# Patient Record
Sex: Female | Born: 1975 | Race: Black or African American | Hispanic: No | Marital: Single | Smoking: Never smoker
Health system: Southern US, Community
[De-identification: ages and names within clinical notes are randomized; demographics above are authoritative.]

## PROBLEM LIST (undated history)

## (undated) DIAGNOSIS — N946 Dysmenorrhea, unspecified: Secondary | ICD-10-CM

## (undated) DIAGNOSIS — F439 Reaction to severe stress, unspecified: Secondary | ICD-10-CM

## (undated) DIAGNOSIS — F329 Major depressive disorder, single episode, unspecified: Secondary | ICD-10-CM

## (undated) DIAGNOSIS — D649 Anemia, unspecified: Secondary | ICD-10-CM

## (undated) DIAGNOSIS — D219 Benign neoplasm of connective and other soft tissue, unspecified: Secondary | ICD-10-CM

## (undated) DIAGNOSIS — F32A Depression, unspecified: Secondary | ICD-10-CM

## (undated) DIAGNOSIS — G4700 Insomnia, unspecified: Secondary | ICD-10-CM

## (undated) DIAGNOSIS — D509 Iron deficiency anemia, unspecified: Secondary | ICD-10-CM

## (undated) DIAGNOSIS — Z9289 Personal history of other medical treatment: Secondary | ICD-10-CM

## (undated) DIAGNOSIS — I10 Essential (primary) hypertension: Secondary | ICD-10-CM

## (undated) HISTORY — DX: Dysmenorrhea, unspecified: N94.6

## (undated) HISTORY — PX: OTHER SURGICAL HISTORY: SHX169

## (undated) HISTORY — DX: Benign neoplasm of connective and other soft tissue, unspecified: D21.9

## (undated) HISTORY — DX: Major depressive disorder, single episode, unspecified: F32.9

## (undated) HISTORY — DX: Iron deficiency anemia, unspecified: D50.9

## (undated) HISTORY — PX: DILATION AND CURETTAGE OF UTERUS: SHX78

## (undated) HISTORY — DX: Insomnia, unspecified: G47.00

## (undated) HISTORY — DX: Reaction to severe stress, unspecified: F43.9

## (undated) HISTORY — DX: Essential (primary) hypertension: I10

## (undated) HISTORY — DX: Depression, unspecified: F32.A

---

## 2001-04-25 ENCOUNTER — Other Ambulatory Visit: Admission: RE | Admit: 2001-04-25 | Discharge: 2001-04-25 | Payer: Self-pay | Admitting: *Deleted

## 2002-04-25 ENCOUNTER — Other Ambulatory Visit: Admission: RE | Admit: 2002-04-25 | Discharge: 2002-04-25 | Payer: Self-pay | Admitting: *Deleted

## 2003-05-27 ENCOUNTER — Other Ambulatory Visit: Admission: RE | Admit: 2003-05-27 | Discharge: 2003-05-27 | Payer: Self-pay | Admitting: Gynecology

## 2004-06-02 ENCOUNTER — Other Ambulatory Visit: Admission: RE | Admit: 2004-06-02 | Discharge: 2004-06-02 | Payer: Self-pay | Admitting: Gynecology

## 2011-07-29 ENCOUNTER — Encounter (HOSPITAL_COMMUNITY): Payer: Self-pay | Admitting: *Deleted

## 2011-07-29 ENCOUNTER — Emergency Department (HOSPITAL_COMMUNITY)
Admission: EM | Admit: 2011-07-29 | Discharge: 2011-07-29 | Disposition: A | Discharge status: Home / Self Care | Payer: Self-pay | Attending: Emergency Medicine | Admitting: Emergency Medicine

## 2011-07-29 DIAGNOSIS — N3090 Cystitis, unspecified without hematuria: Secondary | ICD-10-CM | POA: Insufficient documentation

## 2011-07-29 DIAGNOSIS — R319 Hematuria, unspecified: Secondary | ICD-10-CM | POA: Insufficient documentation

## 2011-07-29 DIAGNOSIS — R300 Dysuria: Secondary | ICD-10-CM | POA: Insufficient documentation

## 2011-07-29 LAB — URINALYSIS, ROUTINE W REFLEX MICROSCOPIC
Glucose, UA: NEGATIVE mg/dL
Protein, ur: 100 mg/dL — AB
Specific Gravity, Urine: 1.004 — ABNORMAL LOW (ref 1.005–1.030)
pH: 6.5 (ref 5.0–8.0)

## 2011-07-29 LAB — URINE MICROSCOPIC-ADD ON

## 2011-07-29 LAB — POCT PREGNANCY, URINE: Preg Test, Ur: NEGATIVE

## 2011-07-29 MED ORDER — CIPROFLOXACIN HCL 500 MG PO TABS: 500.0000 mg | ORAL_TABLET | Freq: Two times a day (BID) | ORAL | Status: AC

## 2011-07-29 MED ORDER — PHENAZOPYRIDINE HCL 200 MG PO TABS: 200.0000 mg | ORAL_TABLET | Freq: Three times a day (TID) | ORAL | Status: AC

## 2011-07-29 NOTE — ED Notes (Signed)
Patient presents with c/o frequent urination and burning upon urination.

## 2011-07-29 NOTE — ED Notes (Addendum)
C/o dysuria, frequency, urgency and retention. Onset 0200. (Denies: fever, back pain or nv). Urine is pink.

## 2011-07-29 NOTE — ED Provider Notes (Signed)
History     CSN: [id number]  Arrival date & time 07/29/11  4098   First MD Initiated Contact with Patient 07/29/11 [phone]      Chief Complaint  Patient presents with  . Dysuria    (Consider location/radiation/quality/duration/timing/severity/associated sxs/prior treatment) Patient is a 36 y.o. female presenting with dysuria. The history is provided by the patient. No language interpreter was used.  Dysuria  This is a recurrent problem. The current episode started 6 to 12 hours ago. The problem occurs every urination. The problem has not changed since onset.The quality of the pain is described as burning. Pain scale: no pain when not urinating. There has been no fever. There is no history of pyelonephritis. Associated symptoms include frequency, hematuria, hesitancy and urgency. Pertinent negatives include no chills, no sweats, no nausea, no vomiting, no discharge, no possible pregnancy and no flank pain. She has tried nothing for the symptoms. Her past medical history is significant for recurrent UTIs. Her past medical history does not include kidney stones.    History reviewed. No pertinent past medical history.  History reviewed. No pertinent past surgical history.  Family History  Problem Relation Age of Onset  . Diabetes Mother   . Hypertension Mother     History  Substance Use Topics  . Smoking status: Never Smoker   . Smokeless tobacco: Not on file  . Alcohol Use: No    OB History    Grav Para Term Preterm Abortions TAB SAB Ect Mult Living                  Review of Systems  Constitutional: Negative for chills.  Gastrointestinal: Negative for nausea and vomiting.  Genitourinary: Positive for dysuria, hesitancy, urgency, frequency and hematuria. Negative for flank pain.  All other systems reviewed and are negative.    Allergies  Review of patient's allergies indicates no known allergies.  Home Medications   Current Outpatient Rx  Name Route Sig Dispense  Refill  . PHENTERMINE HCL 37.5 MG PO CAPS Oral Take 37.5 mg by mouth every morning.      BP 132/79  Pulse 94  Temp(Src) 97.9 F (36.6 C) (Oral)  Resp 16  SpO2 100%  LMP 07/15/2011  Physical Exam  Constitutional: She is oriented to person, place, and time. She appears well-developed and well-nourished.  HENT:  Head: Normocephalic and atraumatic.  Eyes: Conjunctivae are normal. Pupils are equal, round, and reactive to light.  Neck: Normal range of motion. Neck supple.  Cardiovascular: Normal rate and regular rhythm.   Pulmonary/Chest: Effort normal and breath sounds normal.  Abdominal: Soft. Bowel sounds are normal. There is no tenderness. There is no rebound and no guarding.  Musculoskeletal: Normal range of motion.  Neurological: She is alert and oriented to person, place, and time.  Skin: Skin is warm and dry.  Psychiatric: She has a normal mood and affect.    ED Course  Procedures (including critical care time)   Labs Reviewed  POCT PREGNANCY, URINE  URINALYSIS, ROUTINE W REFLEX MICROSCOPIC   No results found.   No diagnosis found.    MDM  Return for fevers, chills, n/v/d.  Follow up with your family doctor       Robertine Kipper K Kalsey Lull-Rasch, MD 07/30/11 [phone]

## 2011-07-29 NOTE — Discharge Instructions (Signed)

## 2014-06-25 ENCOUNTER — Encounter (HOSPITAL_COMMUNITY): Payer: Self-pay | Admitting: *Deleted

## 2014-06-25 ENCOUNTER — Emergency Department (HOSPITAL_COMMUNITY)
Admission: EM | Admit: 2014-06-25 | Discharge: 2014-06-25 | Disposition: A | Discharge status: Home / Self Care | Payer: 59 | Attending: Emergency Medicine | Admitting: Emergency Medicine

## 2014-06-25 DIAGNOSIS — Z3202 Encounter for pregnancy test, result negative: Secondary | ICD-10-CM | POA: Diagnosis not present

## 2014-06-25 DIAGNOSIS — D649 Anemia, unspecified: Secondary | ICD-10-CM | POA: Insufficient documentation

## 2014-06-25 DIAGNOSIS — R61 Generalized hyperhidrosis: Secondary | ICD-10-CM | POA: Diagnosis not present

## 2014-06-25 DIAGNOSIS — R110 Nausea: Secondary | ICD-10-CM

## 2014-06-25 DIAGNOSIS — N390 Urinary tract infection, site not specified: Secondary | ICD-10-CM | POA: Diagnosis not present

## 2014-06-25 DIAGNOSIS — R112 Nausea with vomiting, unspecified: Secondary | ICD-10-CM | POA: Diagnosis present

## 2014-06-25 LAB — COMPREHENSIVE METABOLIC PANEL
ALK PHOS: 96 U/L (ref 39–117)
ALT: 19 U/L (ref 0–35)
AST: 26 U/L (ref 0–37)
Albumin: 3.7 g/dL (ref 3.5–5.2)
Anion gap: 12 (ref 5–15)
BILIRUBIN TOTAL: 0.3 mg/dL (ref 0.3–1.2)
BUN: 12 mg/dL (ref 6–23)
CHLORIDE: 107 mmol/L (ref 96–112)
CO2: 21 mmol/L (ref 19–32)
Calcium: 9.2 mg/dL (ref 8.4–10.5)
Creatinine, Ser: 0.7 mg/dL (ref 0.50–1.10)
GLUCOSE: 118 mg/dL — AB (ref 70–99)
POTASSIUM: 3.9 mmol/L (ref 3.5–5.1)
SODIUM: 140 mmol/L (ref 135–145)
Total Protein: 7.6 g/dL (ref 6.0–8.3)

## 2014-06-25 LAB — CBC WITH DIFFERENTIAL/PLATELET
BASOS PCT: 0 % (ref 0–1)
Basophils Absolute: 0 10*3/uL (ref 0.0–0.1)
EOS ABS: 0 10*3/uL (ref 0.0–0.7)
EOS PCT: 0 % (ref 0–5)
HCT: 29.5 % — ABNORMAL LOW (ref 36.0–46.0)
HEMOGLOBIN: 8.6 g/dL — AB (ref 12.0–15.0)
LYMPHS PCT: 5 % — AB (ref 12–46)
Lymphs Abs: 0.5 10*3/uL — ABNORMAL LOW (ref 0.7–4.0)
MCH: 20.1 pg — ABNORMAL LOW (ref 26.0–34.0)
MCHC: 29.2 g/dL — AB (ref 30.0–36.0)
MCV: 68.9 fL — ABNORMAL LOW (ref 78.0–100.0)
Monocytes Absolute: 0.3 10*3/uL (ref 0.1–1.0)
Monocytes Relative: 3 % (ref 3–12)
NEUTROS PCT: 92 % — AB (ref 43–77)
Neutro Abs: 8.3 10*3/uL — ABNORMAL HIGH (ref 1.7–7.7)
Platelets: 390 10*3/uL (ref 150–400)
RBC: 4.28 MIL/uL (ref 3.87–5.11)
RDW: 17.7 % — ABNORMAL HIGH (ref 11.5–15.5)
WBC: 9.1 10*3/uL (ref 4.0–10.5)

## 2014-06-25 LAB — PREGNANCY, URINE: PREG TEST UR: NEGATIVE

## 2014-06-25 LAB — URINE MICROSCOPIC-ADD ON

## 2014-06-25 LAB — URINALYSIS, ROUTINE W REFLEX MICROSCOPIC
BILIRUBIN URINE: NEGATIVE
Glucose, UA: NEGATIVE mg/dL
HGB URINE DIPSTICK: NEGATIVE
KETONES UR: NEGATIVE mg/dL
NITRITE: NEGATIVE
PROTEIN: NEGATIVE mg/dL
SPECIFIC GRAVITY, URINE: 1.025 (ref 1.005–1.030)
UROBILINOGEN UA: 0.2 mg/dL (ref 0.0–1.0)
pH: 6 (ref 5.0–8.0)

## 2014-06-25 LAB — LIPASE, BLOOD: Lipase: 21 U/L (ref 11–59)

## 2014-06-25 MED ORDER — ONDANSETRON 8 MG PO TBDP: 8.0000 mg | ORAL_TABLET | Freq: Three times a day (TID) | ORAL | Status: DC | PRN

## 2014-06-25 MED ORDER — ONDANSETRON 4 MG PO TBDP
ORAL_TABLET | ORAL | Status: AC
  Filled 2014-06-25: qty 2

## 2014-06-25 MED ORDER — ONDANSETRON 4 MG PO TBDP
8.0000 mg | ORAL_TABLET | Freq: Once | ORAL | Status: AC
  Administered 2014-06-25: 8 mg via ORAL

## 2014-06-25 MED ORDER — SODIUM CHLORIDE 0.9 % IV BOLUS (SEPSIS)
1000.0000 mL | Freq: Once | INTRAVENOUS | Status: AC
  Administered 2014-06-25: 1000 mL via INTRAVENOUS

## 2014-06-25 MED ORDER — CEPHALEXIN 500 MG PO CAPS: 500.0000 mg | ORAL_CAPSULE | Freq: Three times a day (TID) | ORAL | Status: DC

## 2014-06-25 NOTE — ED Notes (Signed)
Pt reports vomiting before work, on the way to work, and while waiting in the waiting room. Pt denies diarrhea or fevers at home.

## 2014-06-25 NOTE — ED Notes (Signed)
Pt comfortable with discharge and follow up instructions. Prescriptions x2. 

## 2014-06-25 NOTE — Discharge Instructions (Signed)
Anemia, Nonspecific Anemia is a condition in which the concentration of red blood cells or hemoglobin in the blood is below normal. Hemoglobin is a substance in red blood cells that carries oxygen to the tissues of the body. Anemia results in not enough oxygen reaching these tissues.  CAUSES  Common causes of anemia include:   Excessive bleeding. Bleeding may be internal or external. This includes excessive bleeding from periods (in women) or from the intestine.   Poor nutrition.   Chronic kidney, thyroid, and liver disease.  Bone marrow disorders that decrease red blood cell production.  Cancer and treatments for cancer.  HIV, AIDS, and their treatments.  Spleen problems that increase red blood cell destruction.  Blood disorders.  Excess destruction of red blood cells due to infection, medicines, and autoimmune disorders. SIGNS AND SYMPTOMS   Minor weakness.   Dizziness.   Headache.  Palpitations.   Shortness of breath, especially with exercise.   Paleness.  Cold sensitivity.  Indigestion.  Nausea.  Difficulty sleeping.  Difficulty concentrating. Symptoms may occur suddenly or they may develop slowly.  DIAGNOSIS  Additional blood tests are often needed. These help your health care provider determine the best treatment. Your health care provider will check your stool for blood and look for other causes of blood loss.  TREATMENT  Treatment varies depending on the cause of the anemia. Treatment can include:   Supplements of iron, vitamin J24, or folic acid.   Hormone medicines.   A blood transfusion. This may be needed if blood loss is severe.   Hospitalization. This may be needed if there is significant continual blood loss.   Dietary changes.  Spleen removal. HOME CARE INSTRUCTIONS Keep all follow-up appointments. It often takes many weeks to correct anemia, and having your health care provider check on your condition and your response to  treatment is very important. SEEK IMMEDIATE MEDICAL CARE IF:   You develop extreme weakness, shortness of breath, or chest pain.   You become dizzy or have trouble concentrating.  You develop heavy vaginal bleeding.   You develop a rash.   You have bloody or black, tarry stools.   You faint.   You vomit up blood.   You vomit repeatedly.   You have abdominal pain.  You have a fever or persistent symptoms for more than 2-3 days.   You have a fever and your symptoms suddenly get worse.   You are dehydrated.  MAKE SURE YOU:  Understand these instructions.  Will watch your condition.  Will get help right away if you are not doing well or get worse. Document Released: 04/20/2004 Document Revised: 11/13/2012 Document Reviewed: 09/06/2012 [facility] Patient Information 2015 [place], [place]. This information is not intended to replace advice given to you by your health care provider. Make sure you discuss any questions you have with your health care provider.  Nausea, Adult Nausea is the feeling that you have an upset stomach or have to vomit. Nausea by itself is not likely a serious concern, but it may be an early sign of more serious medical problems. As nausea gets worse, it can lead to vomiting. If vomiting develops, there is the risk of dehydration.  CAUSES   Viral infections.  Food poisoning.  Medicines.  Pregnancy.  Motion sickness.  Migraine headaches.  Emotional distress.  Severe pain from any source.  Alcohol intoxication. HOME CARE INSTRUCTIONS  Get plenty of rest.  Ask your caregiver about specific rehydration instructions.  Eat small amounts of food  and sip liquids more often.  Take all medicines as told by your caregiver. SEEK MEDICAL CARE IF:  You have not improved after 2 days, or you get worse.  You have a headache. SEEK IMMEDIATE MEDICAL CARE IF:   You have a fever.  You faint.  You keep vomiting or have blood in your  vomit.  You are extremely weak or dehydrated.  You have dark or bloody stools.  You have severe chest or abdominal pain. MAKE SURE YOU:  Understand these instructions.  Will watch your condition.  Will get help right away if you are not doing well or get worse. Document Released: 04/20/2004 Document Revised: 12/06/2011 Document Reviewed: 11/23/2010 [facility] Patient Information 2015 [place], [place]. This information is not intended to replace advice given to you by your health care provider. Make sure you discuss any questions you have with your health care provider.  Urinary Tract Infection A urinary tract infection (UTI) can occur any place along the urinary tract. The tract includes the kidneys, ureters, bladder, and urethra. A type of germ called bacteria often causes a UTI. UTIs are often helped with antibiotic medicine.  HOME CARE   If given, take antibiotics as told by your doctor. Finish them even if you start to feel better.  Drink enough fluids to keep your pee (urine) clear or pale yellow.  Avoid tea, drinks with caffeine, and bubbly (carbonated) drinks.  Pee often. Avoid holding your pee in for a long time.  Pee before and after having sex (intercourse).  Wipe from front to back after you poop (bowel movement) if you are a woman. Use each tissue only once. GET HELP RIGHT AWAY IF:   You have back pain.  You have lower belly (abdominal) pain.  You have chills.  You feel sick to your stomach (nauseous).  You throw up (vomit).  Your burning or discomfort with peeing does not go away.  You have a fever.  Your symptoms are not better in 3 days. MAKE SURE YOU:   Understand these instructions.  Will watch your condition.  Will get help right away if you are not doing well or get worse. Document Released: 08/30/2007 Document Revised: 12/06/2011 Document Reviewed: 10/12/2011 [facility] Patient Information 2015 [place], [place]. This information is not intended  to replace advice given to you by your health care provider. Make sure you discuss any questions you have with your health care provider.

## 2014-06-25 NOTE — ED Provider Notes (Signed)
CSN: [id number]     Arrival date & time 06/25/14  1440 History   First MD Initiated Contact with Patient 06/25/14 1604     Chief Complaint  Patient presents with  . Emesis  . Nausea    Patient is a 39 y.o. female presenting with vomiting. The history is provided by the patient.  Emesis Severity:  Moderate Duration:  1 day Timing:  Intermittent Number of daily episodes:  6 Progression:  Unchanged Chronicity:  New Relieved by:  Nothing Worsened by:  Liquids Associated symptoms: abdominal pain and chills   Associated symptoms: no diarrhea and no fever   Abdominal pain:    Location:  Suprapubic   Quality:  Dull (it occurs when she throws up)   Severity:  Mild Risk factors: no prior abdominal surgery, no sick contacts and no travel to endemic areas     History reviewed. No pertinent past medical history. History reviewed. No pertinent past surgical history. Family History  Problem Relation Age of Onset  . Diabetes Mother   . Hypertension Mother    History  Substance Use Topics  . Smoking status: Never Smoker   . Smokeless tobacco: Not on file  . Alcohol Use: No   OB History    No data available     Review of Systems  Constitutional: Positive for chills and diaphoresis.  Gastrointestinal: Positive for vomiting and abdominal pain. Negative for diarrhea.  Genitourinary: Negative for dysuria, vaginal bleeding and vaginal discharge.  All other systems reviewed and are negative.     Allergies  Review of patient's allergies indicates no known allergies.  Home Medications   Prior to Admission medications   Medication Sig Start Date End Date Taking? Authorizing Provider  acetaminophen (TYLENOL) 650 MG CR tablet Take 650 mg by mouth every 8 (eight) hours as needed for pain or fever.   Yes Historical Provider, MD  cephALEXin (KEFLEX) 500 MG capsule Take 1 capsule (500 mg total) by mouth 3 (three) times daily. 06/25/14   Dorie Rank, MD  ondansetron (ZOFRAN ODT) 8 MG  disintegrating tablet Take 1 tablet (8 mg total) by mouth every 8 (eight) hours as needed for nausea or vomiting. 06/25/14   Dorie Rank, MD   BP 120/55 mmHg  Pulse 93  Temp(Src) 98.2 F (36.8 C) (Oral)  Resp 18  SpO2 100%  LMP 06/07/2014 (Exact Date) Physical Exam  Constitutional: She appears well-developed and well-nourished. No distress.  HENT:  Head: Normocephalic and atraumatic.  Right Ear: External ear normal.  Left Ear: External ear normal.  Eyes: Conjunctivae are normal. Right eye exhibits no discharge. Left eye exhibits no discharge. No scleral icterus.  Neck: Neck supple. No tracheal deviation present.  Cardiovascular: Normal rate, regular rhythm and intact distal pulses.   Pulmonary/Chest: Effort normal and breath sounds normal. No stridor. No respiratory distress. She has no wheezes. She has no rales.  Abdominal: Soft. Bowel sounds are normal. She exhibits no distension. There is no tenderness. There is no rebound and no guarding.  Musculoskeletal: She exhibits no edema or tenderness.  Neurological: She is alert. She has normal strength. No cranial nerve deficit (no facial droop, extraocular movements intact, no slurred speech) or sensory deficit. She exhibits normal muscle tone. She displays no seizure activity. Coordination normal.  Skin: Skin is warm and dry. No rash noted.  Psychiatric: She has a normal mood and affect.  Nursing note and vitals reviewed.   ED Course  Procedures (including critical care time) Labs Review Labs  Reviewed  CBC WITH DIFFERENTIAL/PLATELET - Abnormal; Notable for the following:    Hemoglobin 8.6 (*)    HCT 29.5 (*)    MCV 68.9 (*)    MCH 20.1 (*)    MCHC 29.2 (*)    RDW 17.7 (*)    Neutrophils Relative % 92 (*)    Lymphocytes Relative 5 (*)    Neutro Abs 8.3 (*)    Lymphs Abs 0.5 (*)    All other components within normal limits  COMPREHENSIVE METABOLIC PANEL - Abnormal; Notable for the following:    Glucose, Bld 118 (*)    All other  components within normal limits  URINALYSIS, ROUTINE W REFLEX MICROSCOPIC - Abnormal; Notable for the following:    APPearance CLOUDY (*)    Leukocytes, UA TRACE (*)    All other components within normal limits  URINE MICROSCOPIC-ADD ON - Abnormal; Notable for the following:    Bacteria, UA MANY (*)    All other components within normal limits  URINE CULTURE  LIPASE, BLOOD  PREGNANCY, URINE     MDM   Final diagnoses:  Nausea  Anemia, unspecified anemia type  UTI (lower urinary tract infection)    The patient's urinalysis is suggestive of a urinary tract infection. She improved with fluids and oral Zofran given at triage.  I doubt appendicitis, colitis.  Patient was discharged home with a course of Keflex. I did discuss her anemia with her. She states she has a history of that but has not been taking medications for. I suggested she take over-the-counter iron and follow-up with a primary care doctor.   Dorie Rank, MD 06/25/14 [phone]

## 2014-06-27 LAB — URINE CULTURE

## 2016-09-21 ENCOUNTER — Other Ambulatory Visit (HOSPITAL_COMMUNITY)
Admission: RE | Admit: 2016-09-21 | Discharge: 2016-09-21 | Disposition: A | Discharge status: Home / Self Care | Payer: 59 | Source: Ambulatory Visit | Attending: Obstetrics and Gynecology | Admitting: Obstetrics and Gynecology

## 2016-09-21 ENCOUNTER — Ambulatory Visit (INDEPENDENT_AMBULATORY_CARE_PROVIDER_SITE_OTHER): Payer: No Typology Code available for payment source | Admitting: Obstetrics and Gynecology

## 2016-09-21 ENCOUNTER — Encounter: Payer: Self-pay | Admitting: Obstetrics and Gynecology

## 2016-09-21 VITALS — BP 118/70 | HR 68 | Resp 16 | Ht 67.0 in | Wt 218.0 lb

## 2016-09-21 DIAGNOSIS — N946 Dysmenorrhea, unspecified: Secondary | ICD-10-CM | POA: Diagnosis not present

## 2016-09-21 DIAGNOSIS — Z124 Encounter for screening for malignant neoplasm of cervix: Secondary | ICD-10-CM | POA: Diagnosis not present

## 2016-09-21 DIAGNOSIS — N852 Hypertrophy of uterus: Secondary | ICD-10-CM | POA: Diagnosis not present

## 2016-09-21 DIAGNOSIS — Z0000 Encounter for general adult medical examination without abnormal findings: Secondary | ICD-10-CM | POA: Diagnosis not present

## 2016-09-21 DIAGNOSIS — Z01419 Encounter for gynecological examination (general) (routine) without abnormal findings: Secondary | ICD-10-CM | POA: Diagnosis not present

## 2016-09-21 DIAGNOSIS — N941 Unspecified dyspareunia: Secondary | ICD-10-CM

## 2016-09-21 DIAGNOSIS — B379 Candidiasis, unspecified: Secondary | ICD-10-CM | POA: Insufficient documentation

## 2016-09-21 DIAGNOSIS — Z833 Family history of diabetes mellitus: Secondary | ICD-10-CM | POA: Diagnosis not present

## 2016-09-21 MED ORDER — NAPROXEN SODIUM 550 MG PO TABS: 550.0000 mg | ORAL_TABLET | Freq: Two times a day (BID) | ORAL | 2 refills | Status: DC

## 2016-09-21 NOTE — Progress Notes (Signed)
41 y.o. G0P0000 Single African AmericanF here as a new patient for annual exam.   Period Cycle (Days): 28 Period Duration (Days): 5-6 Period Pattern: Regular Menstrual Flow: Moderate Menstrual Control: Tampon, Thin pad Menstrual Control Change Freq (Hours): 2-3 Dysmenorrhea: (!) Severe Dysmenorrhea Symptoms: Cramping, Throbbing, Headache  She c/o severe deep dyspareunia for the last year, sometimes positional, mostly painful. She partner x 9 years, live together. Uses w/d for contraception.  Her menstrual cramps have gotten worse in the last 1-2 years. Helped with ibuprofen some. Able to function with the ibuprofen.  She c/o frequent "yeast infections", starts with intense itching, over the counter medications help.   Patient's last menstrual period was 09/05/2016.          Sexually active: Yes.    The current method of family planning is none.    Exercising: No.  The patient does not participate in regular exercise at present. Smoker:  no  Health Maintenance: Pap:  10/2011 [facility] History of abnormal Pap:  no MMG:  n/a Colonoscopy:  n/a BMD:   n/a TDaP:  09/2015 Gardasil: n/a   reports that she has never smoked. She has never used smokeless tobacco. She reports that she does not drink alcohol or use drugs. She is a Freight forwarder at [employer]  History reviewed. No pertinent past medical history.  History reviewed. No pertinent surgical history.  Current Outpatient Prescriptions  Medication Sig Dispense Refill  . acetaminophen (TYLENOL) 650 MG CR tablet Take 650 mg by mouth every 8 (eight) hours as needed for pain or fever.     No current facility-administered medications for this visit.     Family History  Problem Relation Age of Onset  . Diabetes Mother   . Hypertension Mother     Review of Systems  Constitutional: Negative.   HENT: Negative.   Eyes: Negative.   Respiratory: Negative.   Cardiovascular: Negative.   Gastrointestinal:  Negative.   Endocrine: Negative.   Genitourinary: Negative.   Musculoskeletal: Negative.   Skin: Negative.   Allergic/Immunologic: Negative.   Neurological: Negative.   Hematological: Negative.   Psychiatric/Behavioral: Negative.     Exam:   BP 118/70 (BP Location: Right Arm, Patient Position: Sitting, Cuff Size: Normal)   Pulse 68   Resp 16   Ht 5\' 7"  (1.702 m)   Wt 218 lb (98.9 kg)   LMP 09/05/2016   BMI 34.14 kg/m   Weight change: @WEIGHTCHANGE @ Height:   Height: 5\' 7"  (170.2 cm)  Ht Readings from Last 3 Encounters:  09/21/16 5\' 7"  (1.702 m)    General appearance: alert, cooperative and appears stated age Head: Normocephalic, without obvious abnormality, atraumatic Neck: no adenopathy, supple, symmetrical, trachea midline and thyroid normal to inspection and palpation Lungs: clear to auscultation bilaterally Cardiovascular: regular rate and rhythm Breasts: normal appearance, no masses or tenderness Abdomen: soft, non-tender; bowel sounds normal; no masses,  no organomegaly Extremities: extremities normal, atraumatic, no cyanosis or edema Skin: Skin color, texture, turgor normal. No rashes or lesions Lymph nodes: Cervical, supraclavicular, and axillary nodes normal. No abnormal inguinal nodes palpated Neurologic: Grossly normal   Pelvic: External genitalia:  no lesions              Urethra:  normal appearing urethra with no masses, tenderness or lesions              Bartholins and Skenes: normal  Vagina: normal appearing vagina with normal color and discharge, no lesions              Cervix: no cervical motion tenderness and no lesions               Bimanual Exam:  Uterus:  anteverted, mobile, slightly enlarged, mildly tender              Adnexa: no mass, fullness, tenderness               Rectovaginal: Confirms               Anus:  normal sphincter tone, no lesions Pelvic floor: not tender  Chaperone was present for exam.  A:  Well Woman with  normal exam  Severe, worsening cramps  Severe dyspareunia  Enlarged uterus, mildly tender  Family history of diabetes   P:   Pap with hpv, GC/CT  Screening labs  HgbA1C  Return for an ultrasound  Discussed the possibility of OCP's or a mirena IUD  Discussed breast self awareness  Discussed calcium and vit D intake  Anaprox for cramps  Mammogram after 41, # given to schedule

## 2016-09-21 NOTE — Patient Instructions (Signed)

## 2016-09-22 ENCOUNTER — Telehealth: Payer: Self-pay | Admitting: Obstetrics & Gynecology

## 2016-09-22 LAB — HEMOGLOBIN A1C
Est. average glucose Bld gHb Est-mCnc: 105 mg/dL
HEMOGLOBIN A1C: 5.3 % (ref 4.8–5.6)

## 2016-09-22 LAB — COMPREHENSIVE METABOLIC PANEL
A/G RATIO: 1.4 (ref 1.2–2.2)
ALBUMIN: 4.2 g/dL (ref 3.5–5.5)
ALT: 8 IU/L (ref 0–32)
AST: 15 IU/L (ref 0–40)
Alkaline Phosphatase: 103 IU/L (ref 39–117)
BUN / CREAT RATIO: 12 (ref 9–23)
BUN: 8 mg/dL (ref 6–20)
Bilirubin Total: 0.2 mg/dL (ref 0.0–1.2)
CALCIUM: 8.8 mg/dL (ref 8.7–10.2)
CO2: 19 mmol/L — AB (ref 20–29)
CREATININE: 0.65 mg/dL (ref 0.57–1.00)
Chloride: 105 mmol/L (ref 96–106)
GFR calc non Af Amer: 112 mL/min/{1.73_m2} (ref >59)
GFR, EST AFRICAN AMERICAN: 129 mL/min/{1.73_m2} (ref >59)
GLOBULIN, TOTAL: 2.9 g/dL (ref 1.5–4.5)
Glucose: 78 mg/dL (ref 65–99)
POTASSIUM: 4.1 mmol/L (ref 3.5–5.2)
SODIUM: 139 mmol/L (ref 134–144)
TOTAL PROTEIN: 7.1 g/dL (ref 6.0–8.5)

## 2016-09-22 LAB — LIPID PANEL
CHOL/HDL RATIO: 2.2 ratio (ref 0.0–4.4)
Cholesterol, Total: 161 mg/dL (ref 100–199)
HDL: 73 mg/dL (ref >39)
LDL CALC: 75 mg/dL (ref 0–99)
Triglycerides: 64 mg/dL (ref 0–149)
VLDL Cholesterol Cal: 13 mg/dL (ref 5–40)

## 2016-09-22 LAB — CBC
HEMATOCRIT: 21.2 % — AB (ref 34.0–46.6)
HEMOGLOBIN: 5.7 g/dL — AB (ref 11.1–15.9)
MCH: 16.3 pg — ABNORMAL LOW (ref 26.6–33.0)
MCHC: 26.9 g/dL — ABNORMAL LOW (ref 31.5–35.7)
MCV: 61 fL — AB (ref 79–97)
Platelets: 213 10*3/uL (ref 150–379)
RBC: 3.49 x10E6/uL — AB (ref 3.77–5.28)
RDW: 21.3 % — AB (ref 12.3–15.4)
WBC: 4 10*3/uL (ref 3.4–10.8)

## 2016-09-22 NOTE — Telephone Encounter (Signed)
Call received from [facility].  Hb 5.7.  Message left on patient's answering machine to call you back.

## 2016-09-24 DIAGNOSIS — Z9289 Personal history of other medical treatment: Secondary | ICD-10-CM

## 2016-09-24 HISTORY — DX: Personal history of other medical treatment: Z92.89

## 2016-09-25 ENCOUNTER — Telehealth: Payer: Self-pay | Admitting: Obstetrics and Gynecology

## 2016-09-25 ENCOUNTER — Other Ambulatory Visit: Payer: Self-pay | Admitting: Obstetrics and Gynecology

## 2016-09-25 DIAGNOSIS — D508 Other iron deficiency anemias: Secondary | ICD-10-CM

## 2016-09-25 DIAGNOSIS — D649 Anemia, unspecified: Secondary | ICD-10-CM

## 2016-09-25 NOTE — Telephone Encounter (Signed)
Call to [facility] to schedule patient. Directed to a scheduling voicemail. Left message noting STAT need for scheduling. Will call back later today to follow up on voicemail.

## 2016-09-25 NOTE — Telephone Encounter (Signed)
Left message to call Annetta North at [phone].  Notes recorded by Salvadore Dom, MD on 09/23/2016 at 8:42 AM EDT Called patient again. Per DPR detailed left was message about her anemia, I requested that she call me on my cell phone. If I don't hear from her by tomorrow, I will call her emergency contact.  Please call hematology Monday first thing, she needs an appointment right away for evaluation, possible transfusion. ------  Notes recorded by Salvadore Dom, MD on 09/23/2016 at 8:25 AM EDT The patient was called Friday morning by Dr Dellis Filbert, she left a message for her to call the office. I called her last night, left my cell phone and asked her to call me back.  When the patient was seen for her annual exam she didn't c/o heavy bleeding or symptoms of anemia. Given her hgb 2 years ago, she clearly has a chronic anemia that has worsened over time. She had a pulse of 68 in the office. Her LMP was 09/05/16. I will try her again this morning, if she is symptomatic in any way, will send her to the ER, otherwise will try to get a stat Hematology consult.  Urgent referral placed to Dr.Ennever's office. This needs to be scheduled right away. Routing to [facility] for scheduling. Per Dr.Jertson PUS is not urgent can wait until after hematology.

## 2016-09-25 NOTE — Telephone Encounter (Signed)
Spoke with patient. Advised of results and message as seen below from [place]. Patient verbalizes understanding. Advised patient that she needs to be seen with hematology ASAP and she will be scheduled for PUS after hematology evaluation as this is priority. Patient is agreeable. Aware she will be contacted with appointment date and time.  Routing to [employer] with STAT need for scheduling.

## 2016-09-25 NOTE — Telephone Encounter (Signed)
Spoke with Roselyn Reef at [facility] office who states earliest appointment will be Friday for cross and screen and transfusion Monday. Call to [place] earliest appointment is 09/29/2016. Call to [facility] who states no availability this week and to call short stay center. Call to [facility] at [facility]. Patient will go to [facility] tomorrow 09/26/2016 for cross and screen lab between 9 am and 3 pm (can walk in). Will go to [facility] again on 09/28/2016 at 7:15 am for 7:30 am for transfusion. Their address is [address] and phone number is [phone]. Patient is agreeable to both appointment dates and times. Patient has an appointment with Dr.Ennever for consultation on 09/29/2016 at 8:30 am at [place], [place] [address]  [phone]. Patient is agreeable and verbalizes understanding.

## 2016-09-25 NOTE — Telephone Encounter (Signed)
Patient is calling a bout scheduling an appointment for a ultrasound.

## 2016-09-25 NOTE — Progress Notes (Signed)
Patient will have 2 units PRBC on Thursday, appointment with Dr Marin Olp on Friday.

## 2016-09-26 ENCOUNTER — Telehealth: Payer: Self-pay

## 2016-09-26 ENCOUNTER — Other Ambulatory Visit (HOSPITAL_COMMUNITY)
Admission: RE | Admit: 2016-09-26 | Discharge: 2016-09-26 | Disposition: A | Discharge status: Home / Self Care | Payer: 59 | Source: Ambulatory Visit | Attending: Obstetrics and Gynecology | Admitting: Obstetrics and Gynecology

## 2016-09-26 ENCOUNTER — Other Ambulatory Visit: Payer: Self-pay | Admitting: Family

## 2016-09-26 DIAGNOSIS — D649 Anemia, unspecified: Secondary | ICD-10-CM | POA: Diagnosis present

## 2016-09-26 DIAGNOSIS — N920 Excessive and frequent menstruation with regular cycle: Secondary | ICD-10-CM

## 2016-09-26 LAB — CYTOLOGY - PAP
Chlamydia: NEGATIVE
Diagnosis: NEGATIVE
HPV: NOT DETECTED
Neisseria Gonorrhea: NEGATIVE

## 2016-09-26 LAB — ABO/RH: ABO/RH(D): B POS

## 2016-09-26 LAB — HEMOGLOBIN AND HEMATOCRIT, BLOOD
HCT: 19.8 % — ABNORMAL LOW (ref 36.0–46.0)
HEMOGLOBIN: 5.5 g/dL — AB (ref 12.0–15.0)

## 2016-09-26 NOTE — Telephone Encounter (Signed)
Left a detailed message at number provided [phone], okay per ROI. Advised her hemoglobin has decreased to 5.5 from 5.7. Per review with Dr.Jertson if she has started her menses she will need to be seen at the ER to have a transfusion today. If she is not on her menses may wait until schedule transfusion on 09/28/2016 at [facility].

## 2016-09-28 ENCOUNTER — Telehealth: Payer: Self-pay

## 2016-09-28 ENCOUNTER — Encounter (HOSPITAL_COMMUNITY): Payer: Self-pay

## 2016-09-28 ENCOUNTER — Ambulatory Visit (HOSPITAL_COMMUNITY)
Admission: RE | Admit: 2016-09-28 | Discharge: 2016-09-28 | Disposition: A | Discharge status: Home / Self Care | Payer: 59 | Source: Ambulatory Visit | Attending: Obstetrics and Gynecology | Admitting: Obstetrics and Gynecology

## 2016-09-28 DIAGNOSIS — D649 Anemia, unspecified: Secondary | ICD-10-CM | POA: Diagnosis not present

## 2016-09-28 HISTORY — DX: Anemia, unspecified: D64.9

## 2016-09-28 LAB — TYPE AND SCREEN
ABO/RH(D): B POS
ANTIBODY SCREEN: NEGATIVE
UNIT DIVISION: 0
Unit division: 0

## 2016-09-28 LAB — PREPARE RBC (CROSSMATCH)

## 2016-09-28 LAB — BPAM RBC
Blood Product Expiration Date: 201807292359
Blood Product Expiration Date: 201807302359
UNIT TYPE AND RH: 7300
Unit Type and Rh: 7300

## 2016-09-28 MED ORDER — DIPHENHYDRAMINE HCL 25 MG PO CAPS
25.0000 mg | ORAL_CAPSULE | Freq: Once | ORAL | Status: AC
  Administered 2016-09-28: 25 mg via ORAL
  Filled 2016-09-28: qty 1

## 2016-09-28 MED ORDER — FLUCONAZOLE 150 MG PO TABS: 150.0000 mg | ORAL_TABLET | Freq: Once | ORAL | 0 refills | Status: AC

## 2016-09-28 MED ORDER — ACETAMINOPHEN 325 MG PO TABS
650.0000 mg | ORAL_TABLET | Freq: Once | ORAL | Status: AC
  Administered 2016-09-28: 650 mg via ORAL
  Filled 2016-09-28: qty 2

## 2016-09-28 MED ORDER — SODIUM CHLORIDE 0.9 % IV SOLN
Freq: Once | INTRAVENOUS | Status: AC
  Administered 2016-09-28: 08:00:00 via INTRAVENOUS

## 2016-09-28 NOTE — Telephone Encounter (Signed)
Patient returning call.

## 2016-09-28 NOTE — Progress Notes (Signed)
Pt came for type and screen/H&H on September 26, 2016.  Blue blood bracelet placed but pt took it off when she got home.  Pt doesn't remember being told to keep bracelet on for today's appointment.  Explained to pt that a new T&S would have to be drawn and may delay her transfusion.  Explained to pt that the blue bracelet has a code on it that links her to her blood sample and it must be compared on the day of infusion.  Pt voiced understanding.  Blood bank was notified of mishap.  New blood sample was drawn and sent to lab.

## 2016-09-28 NOTE — Progress Notes (Signed)
Pt given d/c instructions on blood transfusion.  1st unit started at 0925.

## 2016-09-28 NOTE — Telephone Encounter (Signed)
Spoke with patient. Results given. Patient states she is having vaginal itching and discharge. Rx for Diflucan 150 mg x 1 take second tablet in 72 hours if symptoms persist #2 0RF sent to pharmacy on file. Patient reports she is having her transfusion now and is feeling well. Has not started her menses. Is scheduled to see Dr.Ennever tomorrow 09/29/2016.  Routing to provider for final review. Patient agreeable to disposition. Will close encounter.

## 2016-09-28 NOTE — Telephone Encounter (Signed)
Left message to call Vernor Monnig at [phone]. 

## 2016-09-28 NOTE — Telephone Encounter (Signed)
-----   Message from Salvadore Dom, MD sent at 09/26/2016  6:18 PM EDT ----- 02 recall please inform negative for GC/Chlamydia, + yeast Only if she is symptomatic, treat with diflucan 150 mg x 1, may repeat in 72 hours if still symptomatic. #2, no refills

## 2016-09-28 NOTE — Discharge Instructions (Signed)

## 2016-09-29 ENCOUNTER — Encounter (HOSPITAL_COMMUNITY): Payer: 59

## 2016-09-29 ENCOUNTER — Other Ambulatory Visit (HOSPITAL_BASED_OUTPATIENT_CLINIC_OR_DEPARTMENT_OTHER): Payer: 59

## 2016-09-29 ENCOUNTER — Ambulatory Visit: Payer: 59

## 2016-09-29 ENCOUNTER — Ambulatory Visit (HOSPITAL_BASED_OUTPATIENT_CLINIC_OR_DEPARTMENT_OTHER): Payer: 59 | Admitting: Family

## 2016-09-29 VITALS — BP 126/75 | HR 58 | Temp 98.5°F | Resp 16 | Ht 66.0 in | Wt 222.0 lb

## 2016-09-29 DIAGNOSIS — D649 Anemia, unspecified: Secondary | ICD-10-CM

## 2016-09-29 DIAGNOSIS — D500 Iron deficiency anemia secondary to blood loss (chronic): Secondary | ICD-10-CM | POA: Diagnosis not present

## 2016-09-29 DIAGNOSIS — N920 Excessive and frequent menstruation with regular cycle: Secondary | ICD-10-CM

## 2016-09-29 DIAGNOSIS — D560 Alpha thalassemia: Secondary | ICD-10-CM

## 2016-09-29 LAB — BPAM RBC
Blood Product Expiration Date: 201807292359
Blood Product Expiration Date: 201807302359
ISSUE DATE / TIME: 201807050912
ISSUE DATE / TIME: 201807051147
UNIT TYPE AND RH: 7300
Unit Type and Rh: 7300

## 2016-09-29 LAB — CBC WITH DIFFERENTIAL (CANCER CENTER ONLY)
BASO#: 0 10*3/uL (ref 0.0–0.2)
BASO%: 0.5 % (ref 0.0–2.0)
EOS%: 0.7 % (ref 0.0–7.0)
Eosinophils Absolute: 0 10*3/uL (ref 0.0–0.5)
HCT: 27.9 % — ABNORMAL LOW (ref 34.8–46.6)
HGB: 8.4 g/dL — ABNORMAL LOW (ref 11.6–15.9)
LYMPH#: 2.1 10*3/uL (ref 0.9–3.3)
LYMPH%: 34.4 % (ref 14.0–48.0)
MCH: 20.8 pg — ABNORMAL LOW (ref 26.0–34.0)
MCHC: 30.1 g/dL — ABNORMAL LOW (ref 32.0–36.0)
MCV: 69 fL — AB (ref 81–101)
MONO#: 0.4 10*3/uL (ref 0.1–0.9)
MONO%: 6.9 % (ref 0.0–13.0)
NEUT#: 3.5 10*3/uL (ref 1.5–6.5)
NEUT%: 57.5 % (ref 39.6–80.0)
PLATELETS: 378 10*3/uL (ref 145–400)
RBC: 4.03 10*6/uL (ref 3.70–5.32)
RDW: 26.5 % — AB (ref 11.1–15.7)
WBC: 6.1 10*3/uL (ref 3.9–10.0)

## 2016-09-29 LAB — CMP (CANCER CENTER ONLY)
ALBUMIN: 3.5 g/dL (ref 3.3–5.5)
ALT(SGPT): 23 U/L (ref 10–47)
AST: 22 U/L (ref 11–38)
Alkaline Phosphatase: 82 U/L (ref 26–84)
BILIRUBIN TOTAL: 0.7 mg/dL (ref 0.20–1.60)
BUN, Bld: 7 mg/dL (ref 7–22)
CALCIUM: 8.7 mg/dL (ref 8.0–10.3)
CHLORIDE: 109 meq/L — AB (ref 98–108)
CO2: 21 mEq/L (ref 18–33)
CREATININE: 0.5 mg/dL — AB (ref 0.6–1.2)
Glucose, Bld: 86 mg/dL (ref 73–118)
Potassium: 3.6 mEq/L (ref 3.3–4.7)
Sodium: 135 mEq/L (ref 128–145)
TOTAL PROTEIN: 7.6 g/dL (ref 6.4–8.1)

## 2016-09-29 LAB — TYPE AND SCREEN
ABO/RH(D): B POS
Antibody Screen: NEGATIVE
UNIT DIVISION: 0
Unit division: 0

## 2016-09-29 LAB — IRON AND TIBC
%SAT: 6 % — ABNORMAL LOW (ref 21–57)
IRON: 26 ug/dL — AB (ref 41–142)
TIBC: 468 ug/dL — AB (ref 236–444)
UIBC: 442 ug/dL — ABNORMAL HIGH (ref 120–384)

## 2016-09-29 LAB — CHCC SATELLITE - SMEAR

## 2016-09-29 LAB — FERRITIN: Ferritin: 6 ng/ml — ABNORMAL LOW (ref 9–269)

## 2016-09-29 MED ORDER — FOLIC ACID 1 MG PO TABS: 1.0000 mg | ORAL_TABLET | Freq: Every day | ORAL | 3 refills | Status: DC

## 2016-09-29 NOTE — Progress Notes (Signed)
Hematology/Oncology Consultation   Name: Any Mcneice      MRN: [id number]    Location: Room/bed info not found  Date: 09/29/2016 Time:8:52 AM   REFERRING PHYSICIAN: Dorothy Spark, MD   REASON FOR CONSULT: Low hemoglobin    DIAGNOSIS: No diagnosis found.  HISTORY OF PRESENT ILLNESS: Ms. Kleman is a very pleasant 41 yo African American female with anemia secondary to severe dysmenorrhea fort he last year or so. Her cycles are regular lasting 5-6 days. She has a great deal of cramping and occasional headaches with her cycle.  She received 2 units of blood yesterday for her Hgb of 5.5. Hgb is now up to 8.4 with an MCV of 69.  She has been symptomatic with fatigue, palpitations, chewing ice and intermittent numbness and tingling in her hands.  She is being followed closely by gynecology and plans to schedule an [place] to assess for fibroids. They have discussed starting her on birth control as well.  Her mother and cousin also have a history of dysmenorrhea due to fibroids and her mother required transfusions and eventually a hysterectomy.  No personal or familial history of sickle cell disease or trait.  She states that she has never had surgery. No other episodes of bleeding. No bruising or petechiae.  No lymphadenopathy found on exam. No falls or syncopal episodes.  No personal cancer history. Familial cancer history: maternal great grandmother with breast cancer.  No fever, chills, n/v, cough, rash, dizziness, chest pain, abdominal pain or changes in bowel or bladder habits.  No swelling or tenderness in her extremities. No c/o pain at this time.  She has maintained a good appetite and is staying well hydrated. Her weight is stable.  She is not a smoker and does not drink alcohol.   ROS: All other 10 point review of systems is negative.   PAST MEDICAL HISTORY:   Past Medical History:  Diagnosis Date  . Anemia     ALLERGIES: No Known Allergies    MEDICATIONS:  Current Outpatient  Prescriptions on File Prior to Visit  Medication Sig Dispense Refill  . acetaminophen (TYLENOL) 650 MG CR tablet Take 650 mg by mouth every 8 (eight) hours as needed for pain or fever.    . naproxen sodium (ANAPROX DS) 550 MG tablet Take 1 tablet (550 mg total) by mouth 2 (two) times daily with a meal. 30 tablet 2   No current facility-administered medications on file prior to visit.      PAST SURGICAL HISTORY No past surgical history on file.  FAMILY HISTORY: Family History  Problem Relation Age of Onset  . Diabetes Mother   . Hypertension Mother     SOCIAL HISTORY:  reports that she has never smoked. She has never used smokeless tobacco. She reports that she does not drink alcohol or use drugs.  PERFORMANCE STATUS: The patient's performance status is 1 - Symptomatic but completely ambulatory  PHYSICAL EXAM: Most Recent Vital Signs: Last menstrual period 09/05/2016. BP 126/75 (BP Location: Left Arm, Patient Position: Sitting)   Pulse (!) 58   Temp 98.5 F (36.9 C) (Oral)   Resp 16   Ht 5\' 6"  (1.676 m)   Wt 222 lb (100.7 kg)   LMP 09/05/2016   SpO2 100%   BMI 35.83 kg/m   General Appearance:    Alert, cooperative, no distress, appears stated age  Head:    Normocephalic, without obvious abnormality, atraumatic  Eyes:    PERRL, conjunctiva/corneas clear, EOM's intact, fundi  benign, both eyes        Throat:   Lips, mucosa, and tongue normal; teeth and gums normal  Neck:   Supple, symmetrical, trachea midline, no adenopathy;    thyroid:  no enlargement/tenderness/nodules; no carotid   bruit or JVD  Back:     Symmetric, no curvature, ROM normal, no CVA tenderness  Lungs:     Clear to auscultation bilaterally, respirations unlabored  Chest Wall:    No tenderness or deformity   Heart:    Regular rate and rhythm, S1 and S2 normal, no murmur, rub   or gallop     Abdomen:     Soft, non-tender, bowel sounds active all four quadrants,    no masses, no organomegaly         Extremities:   Extremities normal, atraumatic, no cyanosis or edema  Pulses:   2+ and symmetric all extremities  Skin:   Skin color, texture, turgor normal, no rashes or lesions  Lymph nodes:   Cervical, supraclavicular, and axillary nodes normal  Neurologic:   CNII-XII intact, normal strength, sensation and reflexes    throughout    LABORATORY DATA:  Results for orders placed or performed during the hospital encounter of 09/28/16 (from the past 48 hour(s))  Prepare RBC     Status: None   Collection Time: 09/28/16  7:30 AM  Result Value Ref Range   Order Confirmation ORDER PROCESSED BY BLOOD BANK   Type and screen [place]     Status: None   Collection Time: 09/28/16  8:20 AM  Result Value Ref Range   ABO/RH(D) B POS    Antibody Screen NEG    Sample Expiration 10/01/2016    Unit Number [id number]    Blood Component Type RED CELLS,LR    Unit division 00    Status of Unit ISSUED,FINAL    Transfusion Status OK TO TRANSFUSE    Crossmatch Result Compatible    Unit Number [id number]    Blood Component Type RED CELLS,LR    Unit division 00    Status of Unit ISSUED,FINAL    Transfusion Status OK TO TRANSFUSE    Crossmatch Result Compatible   Prepare RBC     Status: None   Collection Time: 09/28/16  9:30 AM  Result Value Ref Range   Order Confirmation ORDER PROCESSED BY BLOOD BANK       RADIOGRAPHY: No results found.     PATHOLOGY: None  ASSESSMENT/PLAN: Ms. Pollino is a very pleasant 41 yo African American female with anemia secondary to severe dysmenorrhea fort he last year or so. Her Hgb was down to 5.5 earlier this week and she received 2 units of blood yesterday. Hgb is now 8.4 with an MCV of 69.  She is still having some fatigue and chewing ice. We will see what her iron studies show and bring her back in next week for an infusion if needed.  We will go ahead and have her start taking folic acid 1 mg PO daily.  She will be scheduling an [place] to  assess for fibroids and continues to be followed closely by gynecology. They have also discussed possibly starting her on birth control.  We will plan to see her back in 6 weeks for repeat lab work and follow-up.   All questions were answered. She will contact our office with any questions or concerns. We can certainly see her much sooner if necessary.  She was discussed with and also seen by  Dr. Marin Olp and he is in agreement with the aforementioned.   [facility]    Addendum:  I saw and examined the patient with Suzannah Bettes. I agree with the above assessment. She clearly has iron deficiency anemia. I looked at her blood smear. She has microcytic red blood cells. She has some ghost cells. I do not see any obvious target cells.  Prior studies show ferritin of 6 with an iron saturation of only 6%.  She has heavy cycles. I'm sure this is the source of her iron deficiency.  We will go ahead and give her IV iron. This will help her out. This will get her blood count back up.  She is young. I would not give her any blood.  I agree with the folic acid.  We spent about 40 minutes with her. We answered all of her questions.  Lattie Haw, MD

## 2016-09-30 LAB — RETICULOCYTES: Reticulocyte Count: 0.9 % (ref 0.6–2.6)

## 2016-09-30 LAB — PROTHROMBIN TIME (PT)
INR: 1 (ref 0.8–1.2)
PROTHROMBIN TIME: 10.8 s (ref 9.1–12.0)

## 2016-09-30 LAB — APTT: APTT: 27 s (ref 24–33)

## 2016-10-01 LAB — VON WILLEBRAND PANEL
FACTOR VIII ACTIVITY: 220 % — AB (ref 57–163)
vWF Activity: 117 % (ref 50–200)
von Willebrand Factor (vWF) Ag: 246 % — ABNORMAL HIGH (ref 50–200)

## 2016-10-02 LAB — HEMOGLOBINOPATHY EVALUATION
HEMOGLOBIN A2 QUANTITATION: 2.1 % (ref 1.8–3.2)
HGB C: 0 %
HGB S: 0 %
HGB VARIANT: 0 %
Hemoglobin F Quantitation: 0 % (ref 0.0–2.0)
Hgb A: 97.9 % (ref 96.4–98.8)

## 2016-10-03 ENCOUNTER — Telehealth: Payer: Self-pay | Admitting: *Deleted

## 2016-10-03 NOTE — Telephone Encounter (Addendum)
Patient is aware of results. First appointment made  ----- Message from Eliezer Bottom, NP sent at 09/29/2016  1:39 PM EDT ----- Regarding: iron  Iron low. She will need 2 doses of IV iron. LOS sent to [facility]. Thank you!  Sarah  ----- Message ----- From: Interface, Lab In Three Zero One Sent: 09/29/2016   8:55 AM To: Eliezer Bottom, NP

## 2016-10-04 ENCOUNTER — Other Ambulatory Visit: Payer: Self-pay | Admitting: Family

## 2016-10-04 DIAGNOSIS — D509 Iron deficiency anemia, unspecified: Secondary | ICD-10-CM | POA: Insufficient documentation

## 2016-10-04 DIAGNOSIS — D500 Iron deficiency anemia secondary to blood loss (chronic): Secondary | ICD-10-CM

## 2016-10-05 ENCOUNTER — Ambulatory Visit (HOSPITAL_BASED_OUTPATIENT_CLINIC_OR_DEPARTMENT_OTHER): Payer: 59

## 2016-10-05 VITALS — BP 130/68 | HR 82 | Temp 98.3°F | Resp 16

## 2016-10-05 DIAGNOSIS — N946 Dysmenorrhea, unspecified: Secondary | ICD-10-CM

## 2016-10-05 DIAGNOSIS — D500 Iron deficiency anemia secondary to blood loss (chronic): Secondary | ICD-10-CM | POA: Diagnosis not present

## 2016-10-05 MED ORDER — SODIUM CHLORIDE 0.9 % IV SOLN
Freq: Once | INTRAVENOUS | Status: AC
  Administered 2016-10-05: 15:00:00 via INTRAVENOUS

## 2016-10-05 MED ORDER — SODIUM CHLORIDE 0.9 % IV SOLN
510.0000 mg | Freq: Once | INTRAVENOUS | Status: AC
  Administered 2016-10-05: 510 mg via INTRAVENOUS
  Filled 2016-10-05: qty 17

## 2016-10-05 NOTE — Patient Instructions (Signed)
Anemia, Nonspecific Anemia is a condition in which the concentration of red blood cells or hemoglobin in the blood is below normal. Hemoglobin is a substance in red blood cells that carries oxygen to the tissues of the body. Anemia results in not enough oxygen reaching these tissues. What are the causes? Common causes of anemia include:  Excessive bleeding. Bleeding may be internal or external. This includes excessive bleeding from periods (in women) or from the intestine.  Poor nutrition.  Chronic kidney, thyroid, and liver disease.  Bone marrow disorders that decrease red blood cell production.  Cancer and treatments for cancer.  HIV, AIDS, and their treatments.  Spleen problems that increase red blood cell destruction.  Blood disorders.  Excess destruction of red blood cells due to infection, medicines, and autoimmune disorders. What are the signs or symptoms?  Minor weakness.  Dizziness.  Headache.  Palpitations.  Shortness of breath, especially with exercise.  Paleness.  Cold sensitivity.  Indigestion.  Nausea.  Difficulty sleeping.  Difficulty concentrating. Symptoms may occur suddenly or they may develop slowly. How is this diagnosed? Additional blood tests are often needed. These help your health care provider determine the best treatment. Your health care provider will check your stool for blood and look for other causes of blood loss. How is this treated? Treatment varies depending on the cause of the anemia. Treatment can include:  Supplements of iron, vitamin B12, or folic acid.  Hormone medicines.  A blood transfusion. This may be needed if blood loss is severe.  Hospitalization. This may be needed if there is significant continual blood loss.  Dietary changes.  Spleen removal. Follow these instructions at home: Keep all follow-up appointments. It often takes many weeks to correct anemia, and having your health care provider check on your  condition and your response to treatment is very important. Get help right away if:  You develop extreme weakness, shortness of breath, or chest pain.  You become dizzy or have trouble concentrating.  You develop heavy vaginal bleeding.  You develop a rash.  You have bloody or black, tarry stools.  You faint.  You vomit up blood.  You vomit repeatedly.  You have abdominal pain.  You have a fever or persistent symptoms for more than 2-3 days.  You have a fever and your symptoms suddenly get worse.  You are dehydrated. This information is not intended to replace advice given to you by your health care provider. Make sure you discuss any questions you have with your health care provider. Document Released: 04/20/2004 Document Revised: 08/25/2015 Document Reviewed: 09/06/2012 Elsevier Interactive Patient Education  2017 Elsevier Inc.  

## 2016-10-09 ENCOUNTER — Telehealth: Payer: Self-pay | Admitting: Obstetrics and Gynecology

## 2016-10-09 NOTE — Telephone Encounter (Signed)
Patient wants to schedule her ultrasound

## 2016-10-09 NOTE — Telephone Encounter (Signed)
Spoke with patient. PUS scheduled for 10/10/2016 at 1:30 pm with 2 pm consult with Dr.Jertson. Patient is agreeable to date and time.  Cc: Lerry Liner and Theresia Lo for PUS benefits  Routing to provider for final review. Patient agreeable to disposition. Will close encounter.

## 2016-10-09 NOTE — Telephone Encounter (Signed)
Left message to call Leonardo Plaia at [phone]. 

## 2016-10-10 ENCOUNTER — Encounter: Payer: Self-pay | Admitting: Obstetrics and Gynecology

## 2016-10-10 ENCOUNTER — Other Ambulatory Visit: Payer: Self-pay | Admitting: Obstetrics and Gynecology

## 2016-10-10 ENCOUNTER — Ambulatory Visit (INDEPENDENT_AMBULATORY_CARE_PROVIDER_SITE_OTHER): Payer: No Typology Code available for payment source

## 2016-10-10 ENCOUNTER — Ambulatory Visit (INDEPENDENT_AMBULATORY_CARE_PROVIDER_SITE_OTHER): Payer: No Typology Code available for payment source | Admitting: Obstetrics and Gynecology

## 2016-10-10 VITALS — BP 112/70 | HR 60 | Resp 16 | Wt 219.0 lb

## 2016-10-10 DIAGNOSIS — N946 Dysmenorrhea, unspecified: Secondary | ICD-10-CM

## 2016-10-10 DIAGNOSIS — N852 Hypertrophy of uterus: Secondary | ICD-10-CM | POA: Diagnosis not present

## 2016-10-10 DIAGNOSIS — N941 Unspecified dyspareunia: Secondary | ICD-10-CM

## 2016-10-10 DIAGNOSIS — N920 Excessive and frequent menstruation with regular cycle: Secondary | ICD-10-CM | POA: Diagnosis not present

## 2016-10-10 DIAGNOSIS — D259 Leiomyoma of uterus, unspecified: Secondary | ICD-10-CM | POA: Diagnosis not present

## 2016-10-10 DIAGNOSIS — N840 Polyp of corpus uteri: Secondary | ICD-10-CM

## 2016-10-10 DIAGNOSIS — N923 Ovulation bleeding: Secondary | ICD-10-CM

## 2016-10-10 NOTE — Patient Instructions (Signed)
Oral Contraception Information Oral contraceptive pills (OCPs) are medicines taken to prevent pregnancy. OCPs work by preventing the ovaries from releasing eggs. The hormones in OCPs also cause the cervical mucus to thicken, preventing the sperm from entering the uterus. The hormones also cause the uterine lining to become thin, not allowing a fertilized egg to attach to the inside of the uterus. OCPs are highly effective when taken exactly as prescribed. However, OCPs do not prevent sexually transmitted diseases (STDs). Safe sex practices, such as using condoms along with the pill, can help prevent STDs. Before taking the pill, you may have a physical exam and Pap test. Your health care provider may order blood tests. The health care provider will make sure you are a good candidate for oral contraception. Discuss with your health care provider the possible side effects of the OCP you may be prescribed. When starting an OCP, it can take 2 to 3 months for the body to adjust to the changes in hormone levels in your body. Types of oral contraception  The combination pill-This pill contains estrogen and progestin (synthetic progesterone) hormones. The combination pill comes in 21-day, 28-day, or 91-day packs. Some types of combination pills are meant to be taken continuously (365-day pills). With 21-day packs, you do not take pills for 7 days after the last pill. With 28-day packs, the pill is taken every day. The last 7 pills are without hormones. Certain types of pills have more than 21 hormone-containing pills. With 91-day packs, the first 84 pills contain both hormones, and the last 7 pills contain no hormones or contain estrogen only.  The minipill-This pill contains the progesterone hormone only. The pill is taken every day continuously. It is very important to take the pill at the same time each day. The minipill comes in packs of 28 pills. All 28 pills contain the hormone. Advantages of oral  contraceptive pills  Decreases premenstrual symptoms.  Treats menstrual period cramps.  Regulates the menstrual cycle.  Decreases a heavy menstrual flow.  May treatacne, depending on the type of pill.  Treats abnormal uterine bleeding.  Treats polycystic ovarian syndrome.  Treats endometriosis.  Can be used as emergency contraception. Things that can make oral contraceptive pills less effective OCPs can be less effective if:  You forget to take the pill at the same time every day.  You have a stomach or intestinal disease that lessens the absorption of the pill.  You take OCPs with other medicines that make OCPs less effective, such as antibiotics, certain HIV medicines, and some seizure medicines.  You take expired OCPs.  You forget to restart the pill on day 7, when using the packs of 21 pills.  Risks associated with oral contraceptive pills Oral contraceptive pills can sometimes cause side effects, such as:  Headache.  Nausea.  Breast tenderness.  Irregular bleeding or spotting.  Combination pills are also associated with a small increased risk of:  Blood clots.  Heart attack.  Stroke.  This information is not intended to replace advice given to you by your health care provider. Make sure you discuss any questions you have with your health care provider. Document Released: 06/03/2002 Document Revised: 08/19/2015 Document Reviewed: 09/01/2012 Elsevier Interactive Patient Education  2018 Elsevier Inc.  

## 2016-10-10 NOTE — Progress Notes (Signed)
GYNECOLOGY  VISIT   HPI: 41 y.o.   Unknown  African American  female   [place] with Patient's last menstrual period was 10/04/2016.   here for follow up dysmenorrhea, painful intercourse, and severe anemia. At the patient's annual exam on 6/28, a routine CBC was drawn. She was noted to have severe anemia with a hgb of 5.7. She received a transfusion and has had f/u with hematology. She has a low ferritin, low iron and an abnormal Von Willebrand panel. She had an iron transfusion last week, has another one this week and is on oral iron. Thalassemia w/u is pending. She has another appointment to see the hematologist on 11/09/16.  At her annual exam she reported changing a pad every 2-3 hours. She states on further thought she is saturating a super tampon and a really big pad in 2 hours. She reports occasional intermenstrual spotting, random.  She is sexually, same partner x 10 years. Use w/d for contraception. Never actively tried to get pregnant. She has been having pain with intercourse, so only rarely has intercourse. Cramps are very bad, she was given anaprox, which helped.   GYNECOLOGIC HISTORY: Patient's last menstrual period was 10/04/2016. Contraception:none  Menopausal hormone therapy: none         OB History    Gravida Para Term Preterm AB Living   0 0 0 0 0 0   SAB TAB Ectopic Multiple Live Births   0 0 0 0 0         Patient Active Problem List   Diagnosis Date Noted  . IDA (iron deficiency anemia) 10/04/2016    Past Medical History:  Diagnosis Date  . Anemia     No past surgical history on file.  Current Outpatient Prescriptions  Medication Sig Dispense Refill  . acetaminophen (TYLENOL) 650 MG CR tablet Take 650 mg by mouth every 8 (eight) hours as needed for pain or fever.    . folic acid (FOLVITE) 1 MG tablet Take 1 tablet (1 mg total) by mouth daily. 90 tablet 3  . naproxen sodium (ANAPROX DS) 550 MG tablet Take 1 tablet (550 mg total) by mouth 2 (two) times daily  with a meal. 30 tablet 2   No current facility-administered medications for this visit.      ALLERGIES: Patient has no known allergies.  Family History  Problem Relation Age of Onset  . Diabetes Mother   . Hypertension Mother     Social History   Social History  . Marital status: Unknown    Spouse name: N/A  . Number of children: N/A  . Years of education: N/A   Occupational History  . Not on file.   Social History Main Topics  . Smoking status: Never Smoker  . Smokeless tobacco: Never Used  . Alcohol use No  . Drug use: No  . Sexual activity: Yes    Birth control/ protection: None   Other Topics Concern  . Not on file   Social History Narrative  . No narrative on file    Review of Systems  Constitutional: Negative.   HENT: Negative.   Eyes: Negative.   Respiratory: Negative.   Cardiovascular: Negative.   Gastrointestinal: Negative.   Genitourinary: Negative.   Musculoskeletal: Negative.   Skin: Negative.   Neurological: Negative.   Endo/Heme/Allergies: Negative.   Psychiatric/Behavioral: Negative.     PHYSICAL EXAMINATION:    BP 112/70 (BP Location: Right Arm, Patient Position: Sitting, Cuff Size: Normal)   Pulse 60  Resp 16   Wt 219 lb (99.3 kg)   LMP 10/04/2016   BMI 35.35 kg/m     General appearance: alert, cooperative and appears stated age  Pelvic: External genitalia:  no lesions              Urethra:  normal appearing urethra with no masses, tenderness or lesions              Bartholins and Skenes: normal                 Vagina: normal appearing vagina with normal color and discharge, no lesions              Cervix: no lesions  Sonohysterogram The procedure and risks of the procedure were reviewed with the patient, consent form was signed. A speculum was placed in the vagina and the cervix was cleansed with betadine. The sonohysterogram catheter was inserted into the uterine cavity without difficulty. Saline was infused under direct  observation with the ultrasound. 3 endometrial polyps were noted.The catheter was removed.    Chaperone was present for exam.  ASSESSMENT Menorrhagia and intermenstrual bleeding. 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 Has f/u scheduled with hematology    PLAN Plan: hysteroscopy, polypectomy, dilation and curettage. Reviewed risks, including: bleeding, infection, uterine perforation, fluid overload, need for further sugery She has not been sexually active since May, prior to that about a year After surgery would recommend either OCP's or the mirena IUD. No contraindications, risks reviewed We discussed that if she thinks she wants children, she shouldn't wait We discussed the potential of uterine artery embolization or hysterectomy if her symptoms don't improve after surgery and on OCP's or the mirena IUD   An After Visit Summary was printed and given to the patient.  25 minutes face to face time of which over 50% was spent in counseling.

## 2016-10-12 ENCOUNTER — Ambulatory Visit (HOSPITAL_BASED_OUTPATIENT_CLINIC_OR_DEPARTMENT_OTHER): Payer: 59

## 2016-10-12 VITALS — BP 114/67 | HR 70 | Temp 98.4°F | Resp 18

## 2016-10-12 DIAGNOSIS — D500 Iron deficiency anemia secondary to blood loss (chronic): Secondary | ICD-10-CM

## 2016-10-12 DIAGNOSIS — N946 Dysmenorrhea, unspecified: Secondary | ICD-10-CM | POA: Diagnosis not present

## 2016-10-12 MED ORDER — SODIUM CHLORIDE 0.9 % IV SOLN
Freq: Once | INTRAVENOUS | Status: AC
  Administered 2016-10-12: 15:00:00 via INTRAVENOUS

## 2016-10-12 MED ORDER — SODIUM CHLORIDE 0.9 % IV SOLN
510.0000 mg | Freq: Once | INTRAVENOUS | Status: AC
  Administered 2016-10-12: 510 mg via INTRAVENOUS
  Filled 2016-10-12: qty 17

## 2016-10-12 NOTE — Patient Instructions (Signed)

## 2016-10-16 ENCOUNTER — Telehealth: Payer: Self-pay | Admitting: Obstetrics and Gynecology

## 2016-10-16 NOTE — Telephone Encounter (Signed)
Spoke with patient. Patient is calling to check on status of scheduling hysteroscopy, polypectomy, dilation and curettage. Advised will send a message to insurance department and scheduling to check on status. Patient verbalizes understanding.  Routing to [employer] and Lamont Snowball, RN

## 2016-10-16 NOTE — Telephone Encounter (Signed)
Patient returned call. Reviewed professional benefit and fee schedule. Patient understood and agreeable. Patient ready to schedule. Patient requesting date the week of 11-13-2016. States she already has time off of work that week as scheduled vacation time.   Patient agreeable to return call from nurse to discuss potential surgery date.   Forwarding to Lamont Snowball, RN for review. Cc: Lerry Liner

## 2016-10-16 NOTE — Telephone Encounter (Signed)
Call placed to patient to review benefits for a recommended hysteroscopy, polypectomy and dilation and curettage. Left voicemail requesting a return call.   cc: Lamont Snowball, RN

## 2016-10-16 NOTE — Telephone Encounter (Signed)
Patient wants to speak with [facility] no information given.

## 2016-10-17 LAB — ALPHA-THALASSEMIA GENOTYPR

## 2016-10-17 NOTE — Telephone Encounter (Signed)
Call to patient. Advised surgery date of 11-13-16 is available.Patietn ready to proceed. Will schedule and call her back.

## 2016-10-18 NOTE — Telephone Encounter (Signed)
Patient returning call.

## 2016-10-18 NOTE — Telephone Encounter (Signed)
Call to patient to confirm surgery scheduling. Left message to call back.

## 2016-10-23 ENCOUNTER — Encounter (HOSPITAL_COMMUNITY): Payer: Self-pay | Admitting: *Deleted

## 2016-10-24 ENCOUNTER — Telehealth: Payer: Self-pay | Admitting: *Deleted

## 2016-10-24 NOTE — Telephone Encounter (Signed)
Spoke with patient and scheduled appointments needed -eh

## 2016-10-24 NOTE — Telephone Encounter (Signed)
Surgery instruction sheet reviewed with patient by Starlyn Skeans, CMA. Appointments scheduled.  Routing to provider for final review. Patient agreeable to disposition. Will close encounter.

## 2016-10-24 NOTE — Telephone Encounter (Signed)
Left message to call regarding pre surgery appointment

## 2016-10-24 NOTE — Telephone Encounter (Signed)
Patient returning your call.

## 2016-10-26 ENCOUNTER — Encounter: Payer: Self-pay | Admitting: Obstetrics and Gynecology

## 2016-10-26 ENCOUNTER — Ambulatory Visit (INDEPENDENT_AMBULATORY_CARE_PROVIDER_SITE_OTHER): Payer: No Typology Code available for payment source | Admitting: Obstetrics and Gynecology

## 2016-10-26 VITALS — BP 110/70 | HR 60 | Resp 16 | Wt 220.0 lb

## 2016-10-26 DIAGNOSIS — N941 Unspecified dyspareunia: Secondary | ICD-10-CM

## 2016-10-26 DIAGNOSIS — D500 Iron deficiency anemia secondary to blood loss (chronic): Secondary | ICD-10-CM

## 2016-10-26 DIAGNOSIS — D259 Leiomyoma of uterus, unspecified: Secondary | ICD-10-CM

## 2016-10-26 DIAGNOSIS — N946 Dysmenorrhea, unspecified: Secondary | ICD-10-CM

## 2016-10-26 DIAGNOSIS — N920 Excessive and frequent menstruation with regular cycle: Secondary | ICD-10-CM

## 2016-10-26 DIAGNOSIS — N923 Ovulation bleeding: Secondary | ICD-10-CM

## 2016-10-26 NOTE — H&P (Signed)
GYNECOLOGY  VISIT   HPI: 41 y.o.   Unknown  African American  female   [place] with Patient's last menstrual period was 10/04/2016 (approximate).   here for pre surgery consult. Patient is scheduled for [facility] hysteroscopy on 11-13-16  The patient was noted to have severe anemia at her annual exam with a hgb of 5.7. She received a blood transfusion and followed up with hematology. She had an iron transfusion, normal thalassemia evaluation and an elevated factor VIII and vWFAg. The patient reports menorrhagia and intermenstrual bleeding. On ultrasound she had multiple myomas, largest 4.7 cm, not in her cavity. On sonohysterogram she had  3 filling defects c/w endometrial polyps.  She also has severe dysmenorrhea and deep dyspareunia.  Not sexually active for months secondary to the pain.   GYNECOLOGIC HISTORY: Patient's last menstrual period was 10/04/2016 (approximate). Contraception:none  Menopausal hormone therapy: none         OB History    Gravida Para Term Preterm AB Living   0 0 0 0 0 0   SAB TAB Ectopic Multiple Live Births   0 0 0 0 0         Patient Active Problem List   Diagnosis Date Noted  . IDA (iron deficiency anemia) 10/04/2016    Past Medical History:  Diagnosis Date  . Anemia   . History of blood transfusion 09/2016   WL - 2 units transfused    No past surgical history on file.  Current Outpatient Prescriptions  Medication Sig Dispense Refill  . acetaminophen (TYLENOL) 650 MG CR tablet Take 650 mg by mouth every 8 (eight) hours as needed for pain or fever.    . folic acid (FOLVITE) 1 MG tablet Take 1 tablet (1 mg total) by mouth daily. 90 tablet 3  . naproxen sodium (ANAPROX DS) 550 MG tablet Take 1 tablet (550 mg total) by mouth 2 (two) times daily with a meal. 30 tablet 2   No current facility-administered medications for this visit.      ALLERGIES: Patient has no known allergies.  Family History  Problem Relation Age of Onset  . Diabetes Mother   .  Hypertension Mother     Social History   Social History  . Marital status: Unknown    Spouse name: N/A  . Number of children: N/A  . Years of education: N/A   Occupational History  . Not on file.   Social History Main Topics  . Smoking status: Never Smoker  . Smokeless tobacco: Never Used  . Alcohol use No  . Drug use: No  . Sexual activity: Yes    Birth control/ protection: None   Other Topics Concern  . Not on file   Social History Narrative  . No narrative on file    Review of Systems  Constitutional: Negative.   HENT: Negative.   Eyes: Negative.   Respiratory: Negative.   Cardiovascular: Negative.   Gastrointestinal: Negative.   Genitourinary: Negative.   Musculoskeletal: Negative.   Skin: Negative.   Neurological: Negative.   Endo/Heme/Allergies: Negative.   Psychiatric/Behavioral: Negative.     PHYSICAL EXAMINATION:    BP 110/70 (BP Location: Right Arm, Patient Position: Sitting, Cuff Size: Normal)   Pulse 60   Resp 16   Wt 220 lb (99.8 kg)   LMP 10/04/2016 (Approximate)   BMI 35.51 kg/m     General appearance: alert, cooperative and appears stated age Neck: no adenopathy, supple, symmetrical, trachea midline and thyroid normal to inspection  and palpation Heart: regular rate and rhythm Lungs: CTAB Abdomen: soft, non-tender; bowel sounds normal; no masses,  no organomegaly Extremities: normal, atraumatic, no cyanosis Skin: normal color, texture and turgor, no rashes or lesions Lymph: normal cervical supraclavicular and inguinal nodes Neurologic: grossly normal     ASSESSMENT Menorrhagia and intermenstrual bleeding. 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 Has f/u scheduled with hematology    PLAN Plan: hysteroscopy, polypectomy, dilation and curettage. Reviewed risks, including: bleeding, infection, uterine  perforation, fluid overload, need for further sugery She has not been sexually active since May, prior to that about a year After surgery would recommend either OCP's or the mirena IUD. No contraindications, risks reviewed We discussed that if she thinks she wants children, she shouldn't wait We discussed the potential of uterine artery embolization or hysterectomy if her symptoms don't improve after surgery and on OCP's or the mirena IUD  An After Visit Summary was printed and given to the patient.     Addendum:  want to check with hematology prior to using OCP's secondary to elevated Factor VIII and vWFAg (message sent to Laverna Peace, NP)

## 2016-10-26 NOTE — Progress Notes (Addendum)
GYNECOLOGY  VISIT   HPI: 41 y.o.   Unknown  African American  female   [place] with Patient's last menstrual period was 10/04/2016 (approximate).   here for pre surgery consult. Patient is scheduled for [facility] hysteroscopy on 11-13-16  The patient was noted to have severe anemia at her annual exam with a hgb of 5.7. She received a blood transfusion and followed up with hematology. She had an iron transfusion, normal thalassemia evaluation and an elevated factor VIII and vWFAg. The patient reports menorrhagia and intermenstrual bleeding. On ultrasound she had multiple myomas, largest 4.7 cm, not in her cavity. On sonohysterogram she had  3 filling defects c/w endometrial polyps.  She also has severe dysmenorrhea and deep dyspareunia.  Not sexually active for months secondary to the pain.   GYNECOLOGIC HISTORY: Patient's last menstrual period was 10/04/2016 (approximate). Contraception:none  Menopausal hormone therapy: none         OB History    Gravida Para Term Preterm AB Living   0 0 0 0 0 0   SAB TAB Ectopic Multiple Live Births   0 0 0 0 0         Patient Active Problem List   Diagnosis Date Noted  . IDA (iron deficiency anemia) 10/04/2016    Past Medical History:  Diagnosis Date  . Anemia   . History of blood transfusion 09/2016   WL - 2 units transfused    No past surgical history on file.  Current Outpatient Prescriptions  Medication Sig Dispense Refill  . acetaminophen (TYLENOL) 650 MG CR tablet Take 650 mg by mouth every 8 (eight) hours as needed for pain or fever.    . folic acid (FOLVITE) 1 MG tablet Take 1 tablet (1 mg total) by mouth daily. 90 tablet 3  . naproxen sodium (ANAPROX DS) 550 MG tablet Take 1 tablet (550 mg total) by mouth 2 (two) times daily with a meal. 30 tablet 2   No current facility-administered medications for this visit.      ALLERGIES: Patient has no known allergies.  Family History  Problem Relation Age of Onset  . Diabetes Mother   .  Hypertension Mother     Social History   Social History  . Marital status: Unknown    Spouse name: N/A  . Number of children: N/A  . Years of education: N/A   Occupational History  . Not on file.   Social History Main Topics  . Smoking status: Never Smoker  . Smokeless tobacco: Never Used  . Alcohol use No  . Drug use: No  . Sexual activity: Yes    Birth control/ protection: None   Other Topics Concern  . Not on file   Social History Narrative  . No narrative on file    Review of Systems  Constitutional: Negative.   HENT: Negative.   Eyes: Negative.   Respiratory: Negative.   Cardiovascular: Negative.   Gastrointestinal: Negative.   Genitourinary: Negative.   Musculoskeletal: Negative.   Skin: Negative.   Neurological: Negative.   Endo/Heme/Allergies: Negative.   Psychiatric/Behavioral: Negative.     PHYSICAL EXAMINATION:    BP 110/70 (BP Location: Right Arm, Patient Position: Sitting, Cuff Size: Normal)   Pulse 60   Resp 16   Wt 220 lb (99.8 kg)   LMP 10/04/2016 (Approximate)   BMI 35.51 kg/m     General appearance: alert, cooperative and appears stated age Neck: no adenopathy, supple, symmetrical, trachea midline and thyroid normal to inspection  and palpation Heart: regular rate and rhythm Lungs: CTAB Abdomen: soft, non-tender; bowel sounds normal; no masses,  no organomegaly Extremities: normal, atraumatic, no cyanosis Skin: normal color, texture and turgor, no rashes or lesions Lymph: normal cervical supraclavicular and inguinal nodes Neurologic: grossly normal     ASSESSMENT Menorrhagia and intermenstrual bleeding. 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 Has f/u scheduled with hematology    PLAN Plan: hysteroscopy, polypectomy, dilation and curettage. Reviewed risks, including: bleeding, infection, uterine  perforation, fluid overload, need for further sugery She has not been sexually active since May, prior to that about a year After surgery would recommend either OCP's or the mirena IUD. No contraindications, risks reviewed We discussed that if she thinks she wants children, she shouldn't wait We discussed the potential of uterine artery embolization or hysterectomy if her symptoms don't improve after surgery and on OCP's or the mirena IUD  An After Visit Summary was printed and given to the patient.     Addendum:  want to check with hematology prior to using OCP's secondary to elevated Factor VIII and vWFAg (message sent to Laverna Peace, NP)  Addendum: The following message was received from hematology: [place], Holli Humbles, NP  Salvadore Dom, MD        I spoke to Dr. Marin Olp and after reviewing her counts he feels that she should be ok to start an OCP. Thank you for letting us know! Please let us know if you have any other questions.   Laverna Peace, FNP-BC  Hematology/Oncology  [place]

## 2016-10-26 NOTE — Patient Instructions (Signed)
Hysteroscopy  Hysteroscopy is a procedure used for looking inside the womb (uterus). It may be done for various reasons, including:  · To evaluate abnormal bleeding, fibroid (benign, noncancerous) tumors, polyps, scar tissue (adhesions), and possibly cancer of the uterus.  · To look for lumps (tumors) and other uterine growths.  · To look for causes of why a woman cannot get pregnant (infertility), causes of recurrent loss of pregnancy (miscarriages), or a lost intrauterine device (IUD).  · To perform a sterilization by blocking the fallopian tubes from inside the uterus.    In this procedure, a thin, flexible tube with a tiny light and camera on the end of it (hysteroscope) is used to look inside the uterus. A hysteroscopy should be done right after a menstrual period to be sure you are not pregnant.  LET YOUR HEALTH CARE PROVIDER KNOW ABOUT:  · Any allergies you have.  · All medicines you are taking, including vitamins, herbs, eye drops, creams, and over-the-counter medicines.  · Previous problems you or members of your family have had with the use of anesthetics.  · Any blood disorders you have.  · Previous surgeries you have had.  · Medical conditions you have.  RISKS AND COMPLICATIONS  Generally, this is a safe procedure. However, as with any procedure, complications can occur. Possible complications include:  · Putting a hole in the uterus.  · Excessive bleeding.  · Infection.  · Damage to the cervix.  · Injury to other organs.  · Allergic reaction to medicines.  · Too much fluid used in the uterus for the procedure.    BEFORE THE PROCEDURE  · Ask your health care provider about changing or stopping any regular medicines.  · Do not take aspirin or blood thinners for 1 week before the procedure, or as directed by your health care provider. These can cause bleeding.  · If you smoke, do not smoke for 2 weeks before the procedure.  · In some cases, a medicine is placed in the cervix the day before the procedure.  This medicine makes the cervix have a larger opening (dilate). This makes it easier for the instrument to be inserted into the uterus during the procedure.  · Do not eat or drink anything for at least 8 hours before the surgery.  · Arrange for someone to take you home after the procedure.  PROCEDURE  · You may be given a medicine to relax you (sedative). You may also be given one of the following:  ? A medicine that numbs the area around the cervix (local anesthetic).  ? A medicine that makes you sleep through the procedure (general anesthetic).  · The hysteroscope is inserted through the vagina into the uterus. The camera on the hysteroscope sends a picture to a TV screen. This gives the surgeon a good view inside the uterus.  · During the procedure, air or a liquid is put into the uterus, which allows the surgeon to see better.  · Sometimes, tissue is gently scraped from inside the uterus. These tissue samples are sent to a lab for testing.  What to expect after the procedure  · If you had a general anesthetic, you may be groggy for a couple hours after the procedure.  · If you had a local anesthetic, you will be able to go home as soon as you are stable and feel ready.  · You may have some cramping. This normally lasts for a couple days.  · You may   have bleeding, which varies from light spotting for a few days to menstrual-like bleeding for 3-7 days. This is normal.  · If your test results are not back during the visit, make an appointment with your health care provider to find out the results.  This information is not intended to replace advice given to you by your health care provider. Make sure you discuss any questions you have with your health care provider.  Document Released: 06/19/2000 Document Revised: 08/19/2015 Document Reviewed: 10/10/2012  Elsevier Interactive Patient Education © 2017 [employer] Inc[employer]

## 2016-11-06 ENCOUNTER — Telehealth: Payer: Self-pay | Admitting: Obstetrics and Gynecology

## 2016-11-06 NOTE — Telephone Encounter (Signed)
Spoke with patient, advised as seen below per Dr. Silva. Patient verbalizes understanding and is agreeable.  Patient is agreeable to disposition. Will close encounter.  

## 2016-11-06 NOTE — Telephone Encounter (Signed)
It is fine for her to continue her folic acid (folvite). Please inform.

## 2016-11-06 NOTE — Telephone Encounter (Signed)
Patient calling with questions for the nurse regarding stopping her vitamins before surgery.

## 2016-11-06 NOTE — Telephone Encounter (Signed)
Spoke with patient. Patient states she takes [place] daily, is aware she should stop any herbal supplement 7 days prior to surgery, surgery scheduled for 11/13/16. Last dose taken 8/13. Patient states she has f/u for labs at [facility] on 8/16 for anemia, should she still stop the supplement?  Advised patient to stop supplement, will review with Dr. Talbert Nan and return call with any additional recommendations. Patient verbalizes understanding and is agreeable.   Dr. Talbert Nan, any additional recommendations?

## 2016-11-09 ENCOUNTER — Other Ambulatory Visit (HOSPITAL_BASED_OUTPATIENT_CLINIC_OR_DEPARTMENT_OTHER): Payer: 59

## 2016-11-09 ENCOUNTER — Ambulatory Visit (HOSPITAL_BASED_OUTPATIENT_CLINIC_OR_DEPARTMENT_OTHER): Payer: 59 | Admitting: Family

## 2016-11-09 VITALS — BP 115/59 | HR 72 | Temp 98.9°F | Resp 16 | Wt 220.8 lb

## 2016-11-09 DIAGNOSIS — D500 Iron deficiency anemia secondary to blood loss (chronic): Secondary | ICD-10-CM | POA: Diagnosis not present

## 2016-11-09 DIAGNOSIS — D560 Alpha thalassemia: Secondary | ICD-10-CM

## 2016-11-09 DIAGNOSIS — N920 Excessive and frequent menstruation with regular cycle: Secondary | ICD-10-CM | POA: Diagnosis not present

## 2016-11-09 LAB — CBC WITH DIFFERENTIAL (CANCER CENTER ONLY)
BASO#: 0 10*3/uL (ref 0.0–0.2)
BASO%: 0.2 % (ref 0.0–2.0)
EOS ABS: 0.1 10*3/uL (ref 0.0–0.5)
EOS%: 1 % (ref 0.0–7.0)
HCT: 34.2 % — ABNORMAL LOW (ref 34.8–46.6)
HEMOGLOBIN: 11.4 g/dL — AB (ref 11.6–15.9)
LYMPH#: 1.3 10*3/uL (ref 0.9–3.3)
LYMPH%: 25.5 % (ref 14.0–48.0)
MCH: 27.2 pg (ref 26.0–34.0)
MCHC: 33.3 g/dL (ref 32.0–36.0)
MCV: 82 fL (ref 81–101)
MONO#: 0.3 10*3/uL (ref 0.1–0.9)
MONO%: 5.3 % (ref 0.0–13.0)
NEUT%: 68 % (ref 39.6–80.0)
NEUTROS ABS: 3.5 10*3/uL (ref 1.5–6.5)
PLATELETS: 316 10*3/uL (ref 145–400)
RBC: 4.19 10*6/uL (ref 3.70–5.32)
RDW: 26 % — ABNORMAL HIGH (ref 11.1–15.7)
WBC: 5.1 10*3/uL (ref 3.9–10.0)

## 2016-11-09 LAB — IRON AND TIBC
%SAT: 16 % — AB (ref 21–57)
Iron: 50 ug/dL (ref 41–142)
TIBC: 310 ug/dL (ref 236–444)
UIBC: 260 ug/dL (ref 120–384)

## 2016-11-09 LAB — FERRITIN: Ferritin: 74 ng/ml (ref 9–269)

## 2016-11-09 NOTE — Progress Notes (Signed)
Hematology and Oncology Follow Up Visit  Stacy Miles [id number] [birth year] 41 y.o. 11/09/2016   Principle Diagnosis:  Iron deficiency anemia secondary to menorrhagia   Current Therapy:   IV iron as indicated - last received in July 2018 x 2   Interim History:  Stacy Miles is here today for follow-up. She received 2 doses of IV iron in July and has responded nicely. Hgb is now up to 11.4 with an MCV of 82.  She verbalized that she is taking her folic acid daily as prescribed.  Her cycle continues to be heavy. She is scheduled for a D&C on Monday 8/20 with Dr. Talbert Nan. Hopefully this will help.  She has had no other bleeding, no bruising or petechiae. No lymphadenopathy found on exam.  She denies fatigue and is no longer craving ice.  No fever, chills, n/v, cough, rash, dizziness, SOB, chest pain, palpitations, abdominal pain or changes in bowel or bladder habits.  No swelling, tenderness, numbness or tingling in her extremities. No c/o pain at this time.  She has maintained a good appetite and is staying well hydrated. Her weight is stable.   ECOG Performance Status: 1 - Symptomatic but completely ambulatory  Medications:  Allergies as of 11/09/2016   No Known Allergies     Medication List       Accurate as of 11/09/16 11:19 AM. Always use your most recent med list.          acetaminophen 650 MG CR tablet Commonly known as:  TYLENOL Take 650 mg by mouth every 8 (eight) hours as needed for pain or fever.   folic acid 1 MG tablet Commonly known as:  FOLVITE Take 1 tablet (1 mg total) by mouth daily.   multivitamin with minerals Tabs tablet Take 1 tablet by mouth daily.   naproxen sodium 550 MG tablet Commonly known as:  ANAPROX DS Take 1 tablet (550 mg total) by mouth 2 (two) times daily with a meal.       Allergies: No Known Allergies  Past Medical History, Surgical history, Social history, and Family History were reviewed and updated.  Review of Systems: All  other 10 point review of systems is negative.   Physical Exam:  weight is 220 lb 12.8 oz (100.2 kg). Her oral temperature is 98.9 F (37.2 C). Her blood pressure is 115/59 (abnormal) and her pulse is 72. Her respiration is 16 and oxygen saturation is 100%.   Wt Readings from Last 3 Encounters:  11/09/16 220 lb 12.8 oz (100.2 kg)  10/26/16 220 lb (99.8 kg)  10/10/16 219 lb (99.3 kg)    Ocular: Sclerae unicteric, pupils equal, round and reactive to light Ear-nose-throat: Oropharynx clear, dentition fair Lymphatic: No cervical, supraclavicular or axillary adenopathy Lungs no rales or rhonchi, good excursion bilaterally Heart regular rate and rhythm, no murmur appreciated Abd soft, nontender, positive bowel sounds, no liver or spleen tip palpated on exam, no fluid wave  MSK no focal spinal tenderness, no joint edema Neuro: non-focal, well-oriented, appropriate affect Breasts: Deferred   Lab Results  Component Value Date   WBC 5.1 11/09/2016   HGB 11.4 (L) 11/09/2016   HCT 34.2 (L) 11/09/2016   MCV 82 11/09/2016   PLT 316 11/09/2016   Lab Results  Component Value Date   FERRITIN 6 (L) 09/29/2016   IRON 26 (L) 09/29/2016   TIBC 468 (H) 09/29/2016   UIBC 442 (H) 09/29/2016   IRONPCTSAT 6 (L) 09/29/2016   Lab Results  Component Value  Date   RBC 4.19 11/09/2016   No results found for: KPAFRELGTCHN, LAMBDASER, KAPLAMBRATIO No results found for: IGGSERUM, IGA, IGMSERUM No results found for: Odetta Pink, SPEI   Chemistry      Component Value Date/Time   NA 135 09/29/2016 0820   K 3.6 09/29/2016 0820   CL 109 (H) 09/29/2016 0820   CO2 21 09/29/2016 0820   BUN 7 09/29/2016 0820   CREATININE 0.5 (L) 09/29/2016 0820      Component Value Date/Time   CALCIUM 8.7 09/29/2016 0820   ALKPHOS 82 09/29/2016 0820   AST 22 09/29/2016 0820   ALT 23 09/29/2016 0820   BILITOT 0.70 09/29/2016 0820      Impression and Plan: MS.  Stacy Miles is a very pleasant 41 yo African American female with iron deficiency anemia secondary to heavy cycles. She received 2 doses of IV last month and has responded nicely. She is asymptomatic and has no complaints at this time.  She is scheduled for [facility] on Monday 8/20 with Dr. Talbert Nan.  We will see what her iron studies show and bring her back in tomorrow for infusion if needed.  We will go ahead and plan to see her back again in another 2 months for repeat lab work and follow-up.  She will contact our office with any questions or concerns. We can certainly see her sooner if need be.   Eliezer Bottom, NP 8/16/201811:19 AM

## 2016-11-10 ENCOUNTER — Ambulatory Visit (HOSPITAL_BASED_OUTPATIENT_CLINIC_OR_DEPARTMENT_OTHER): Payer: 59

## 2016-11-10 VITALS — BP 106/62 | HR 71 | Temp 97.0°F | Resp 16

## 2016-11-10 DIAGNOSIS — D500 Iron deficiency anemia secondary to blood loss (chronic): Secondary | ICD-10-CM | POA: Diagnosis not present

## 2016-11-10 DIAGNOSIS — N920 Excessive and frequent menstruation with regular cycle: Secondary | ICD-10-CM | POA: Diagnosis not present

## 2016-11-10 MED ORDER — FERUMOXYTOL INJECTION 510 MG/17 ML
510.0000 mg | Freq: Once | INTRAVENOUS | Status: AC
  Administered 2016-11-10: 510 mg via INTRAVENOUS
  Filled 2016-11-10: qty 17

## 2016-11-10 NOTE — Patient Instructions (Signed)

## 2016-11-13 ENCOUNTER — Ambulatory Visit (HOSPITAL_COMMUNITY)
Admission: RE | Admit: 2016-11-13 | Discharge: 2016-11-13 | Disposition: A | Discharge status: Home / Self Care | Payer: 59 | Source: Ambulatory Visit | Attending: Obstetrics and Gynecology | Admitting: Obstetrics and Gynecology

## 2016-11-13 ENCOUNTER — Ambulatory Visit (HOSPITAL_COMMUNITY): Payer: 59 | Admitting: Anesthesiology

## 2016-11-13 ENCOUNTER — Encounter (HOSPITAL_COMMUNITY): Payer: Self-pay

## 2016-11-13 ENCOUNTER — Encounter (HOSPITAL_COMMUNITY)
Admission: RE | Disposition: A | Discharge status: Home / Self Care | Payer: Self-pay | Source: Ambulatory Visit | Attending: Obstetrics and Gynecology

## 2016-11-13 DIAGNOSIS — N840 Polyp of corpus uteri: Secondary | ICD-10-CM | POA: Diagnosis not present

## 2016-11-13 DIAGNOSIS — D259 Leiomyoma of uterus, unspecified: Secondary | ICD-10-CM | POA: Insufficient documentation

## 2016-11-13 DIAGNOSIS — D509 Iron deficiency anemia, unspecified: Secondary | ICD-10-CM | POA: Diagnosis not present

## 2016-11-13 DIAGNOSIS — N923 Ovulation bleeding: Secondary | ICD-10-CM | POA: Diagnosis not present

## 2016-11-13 DIAGNOSIS — N920 Excessive and frequent menstruation with regular cycle: Secondary | ICD-10-CM | POA: Diagnosis not present

## 2016-11-13 DIAGNOSIS — N9412 Deep dyspareunia: Secondary | ICD-10-CM | POA: Diagnosis not present

## 2016-11-13 DIAGNOSIS — Z791 Long term (current) use of non-steroidal anti-inflammatories (NSAID): Secondary | ICD-10-CM | POA: Insufficient documentation

## 2016-11-13 DIAGNOSIS — D500 Iron deficiency anemia secondary to blood loss (chronic): Secondary | ICD-10-CM | POA: Diagnosis not present

## 2016-11-13 HISTORY — DX: Personal history of other medical treatment: Z92.89

## 2016-11-13 HISTORY — PX: DILATATION & CURETTAGE/HYSTEROSCOPY WITH MYOSURE: SHX6511

## 2016-11-13 LAB — PREGNANCY, URINE: PREG TEST UR: NEGATIVE

## 2016-11-13 SURGERY — DILATATION & CURETTAGE/HYSTEROSCOPY WITH MYOSURE
Anesthesia: General | Site: Vagina

## 2016-11-13 MED ORDER — FENTANYL CITRATE (PF) 100 MCG/2ML IJ SOLN
25.0000 ug | INTRAMUSCULAR | Status: DC | PRN
  Administered 2016-11-13: 50 ug via INTRAVENOUS

## 2016-11-13 MED ORDER — ONDANSETRON HCL 4 MG/2ML IJ SOLN
INTRAMUSCULAR | Status: DC | PRN
  Administered 2016-11-13: 4 mg via INTRAVENOUS

## 2016-11-13 MED ORDER — ACETAMINOPHEN 160 MG/5ML PO SOLN: 325.0000 mg | ORAL | Status: DC | PRN

## 2016-11-13 MED ORDER — GLYCOPYRROLATE 0.2 MG/ML IJ SOLN
INTRAMUSCULAR | Status: AC
  Filled 2016-11-13: qty 1

## 2016-11-13 MED ORDER — PROPOFOL 10 MG/ML IV BOLUS
INTRAVENOUS | Status: DC | PRN
  Administered 2016-11-13: 200 mg via INTRAVENOUS

## 2016-11-13 MED ORDER — SODIUM CHLORIDE 0.9 % IR SOLN
Status: DC | PRN
  Administered 2016-11-13: 3000 mL

## 2016-11-13 MED ORDER — SCOPOLAMINE 1 MG/3DAYS TD PT72
1.0000 | MEDICATED_PATCH | Freq: Once | TRANSDERMAL | Status: DC
  Administered 2016-11-13: 1.5 mg via TRANSDERMAL

## 2016-11-13 MED ORDER — LACTATED RINGERS IV SOLN
INTRAVENOUS | Status: DC
  Administered 2016-11-13 (×2): via INTRAVENOUS

## 2016-11-13 MED ORDER — DEXAMETHASONE SODIUM PHOSPHATE 10 MG/ML IJ SOLN
INTRAMUSCULAR | Status: DC | PRN
  Administered 2016-11-13: 5 mg via INTRAVENOUS

## 2016-11-13 MED ORDER — KETOROLAC TROMETHAMINE 30 MG/ML IJ SOLN
INTRAMUSCULAR | Status: AC
  Filled 2016-11-13: qty 1

## 2016-11-13 MED ORDER — KETOROLAC TROMETHAMINE 30 MG/ML IJ SOLN
INTRAMUSCULAR | Status: DC | PRN
  Administered 2016-11-13: 30 mg via INTRAVENOUS

## 2016-11-13 MED ORDER — ONDANSETRON HCL 4 MG/2ML IJ SOLN
INTRAMUSCULAR | Status: AC
  Filled 2016-11-13: qty 2

## 2016-11-13 MED ORDER — DEXAMETHASONE SODIUM PHOSPHATE 10 MG/ML IJ SOLN
INTRAMUSCULAR | Status: AC
  Filled 2016-11-13: qty 1

## 2016-11-13 MED ORDER — ONDANSETRON HCL 4 MG/2ML IJ SOLN: 4.0000 mg | Freq: Once | INTRAMUSCULAR | Status: DC | PRN

## 2016-11-13 MED ORDER — PROPOFOL 10 MG/ML IV BOLUS
INTRAVENOUS | Status: AC
  Filled 2016-11-13: qty 20

## 2016-11-13 MED ORDER — FENTANYL CITRATE (PF) 100 MCG/2ML IJ SOLN
INTRAMUSCULAR | Status: DC | PRN
Start: 2016-11-13 — End: 2016-11-13
  Administered 2016-11-13: 50 ug via INTRAVENOUS
  Administered 2016-11-13: 100 ug via INTRAVENOUS

## 2016-11-13 MED ORDER — LACTATED RINGERS IV SOLN
INTRAVENOUS | Status: DC
  Administered 2016-11-13: 09:00:00 via INTRAVENOUS

## 2016-11-13 MED ORDER — SCOPOLAMINE 1 MG/3DAYS TD PT72
MEDICATED_PATCH | TRANSDERMAL | Status: AC
  Administered 2016-11-13: 1.5 mg via TRANSDERMAL
  Filled 2016-11-13: qty 1

## 2016-11-13 MED ORDER — FENTANYL CITRATE (PF) 250 MCG/5ML IJ SOLN
INTRAMUSCULAR | Status: AC
  Filled 2016-11-13: qty 5

## 2016-11-13 MED ORDER — MIDAZOLAM HCL 2 MG/2ML IJ SOLN
INTRAMUSCULAR | Status: DC | PRN
  Administered 2016-11-13: 2 mg via INTRAVENOUS

## 2016-11-13 MED ORDER — ACETAMINOPHEN 325 MG PO TABS: 325.0000 mg | ORAL_TABLET | ORAL | Status: DC | PRN

## 2016-11-13 MED ORDER — OXYCODONE HCL 5 MG PO TABS: 5.0000 mg | ORAL_TABLET | Freq: Once | ORAL | Status: DC | PRN

## 2016-11-13 MED ORDER — LIDOCAINE HCL (CARDIAC) 20 MG/ML IV SOLN
INTRAVENOUS | Status: DC | PRN
  Administered 2016-11-13: 80 mg via INTRAVENOUS

## 2016-11-13 MED ORDER — MIDAZOLAM HCL 2 MG/2ML IJ SOLN
INTRAMUSCULAR | Status: AC
  Filled 2016-11-13: qty 2

## 2016-11-13 MED ORDER — FENTANYL CITRATE (PF) 100 MCG/2ML IJ SOLN
INTRAMUSCULAR | Status: AC
  Filled 2016-11-13: qty 2

## 2016-11-13 MED ORDER — LIDOCAINE HCL (CARDIAC) 20 MG/ML IV SOLN
INTRAVENOUS | Status: AC
  Filled 2016-11-13: qty 5

## 2016-11-13 MED ORDER — OXYCODONE HCL 5 MG/5ML PO SOLN: 5.0000 mg | Freq: Once | ORAL | Status: DC | PRN

## 2016-11-13 MED ORDER — MEPERIDINE HCL 25 MG/ML IJ SOLN: 6.2500 mg | INTRAMUSCULAR | Status: DC | PRN

## 2016-11-13 MED ORDER — KETOROLAC TROMETHAMINE 30 MG/ML IJ SOLN: 30.0000 mg | Freq: Once | INTRAMUSCULAR | Status: DC | PRN

## 2016-11-13 SURGICAL SUPPLY — 18 items
CANISTER SUCT 3000ML PPV (MISCELLANEOUS) ×3 IMPLANT
CATH ROBINSON RED A/P 16FR (CATHETERS) IMPLANT
CLOTH BEACON ORANGE TIMEOUT ST (SAFETY) ×3 IMPLANT
CONTAINER PREFILL 10% NBF 60ML (FORM) ×6 IMPLANT
DEVICE MYOSURE LITE (MISCELLANEOUS) IMPLANT
DEVICE MYOSURE REACH (MISCELLANEOUS) ×2 IMPLANT
FILTER ARTHROSCOPY CONVERTOR (FILTER) ×3 IMPLANT
GLOVE BIOGEL PI IND STRL 7.0 (GLOVE) ×2 IMPLANT
GLOVE BIOGEL PI INDICATOR 7.0 (GLOVE) ×4
GLOVE ECLIPSE 6.5 STRL STRAW (GLOVE) ×3 IMPLANT
GOWN STRL REUS W/TWL LRG LVL3 (GOWN DISPOSABLE) ×6 IMPLANT
PACK VAGINAL MINOR WOMEN LF (CUSTOM PROCEDURE TRAY) ×3 IMPLANT
PAD OB MATERNITY 4.3X12.25 (PERSONAL CARE ITEMS) ×3 IMPLANT
SEAL ROD LENS SCOPE MYOSURE (ABLATOR) ×3 IMPLANT
SYR 20CC LL (SYRINGE) IMPLANT
TOWEL OR 17X24 6PK STRL BLUE (TOWEL DISPOSABLE) ×6 IMPLANT
TUBING AQUILEX INFLOW (TUBING) ×3 IMPLANT
TUBING AQUILEX OUTFLOW (TUBING) ×3 IMPLANT

## 2016-11-13 NOTE — Op Note (Signed)
Preoperative Diagnosis: menorrhagia, anemia, endometrial polyps, fibroid uterus  Postoperative Diagnosis: same  Procedure: Hysteroscopy, polypectomy, dilation and curettage  Surgeon: Dr Sumner Boast  Assistants: None  Anesthesia: General via LMA  EBL: 20 cc  Fluids: 900 cc LR  Fluid deficit: 135 cc  Urine output: not recorded  Indications for surgery: The patient is a 41 yo female, who presented for an annual exam, routine hgb returned at 5.7 gm/dl. On questioning she reports menorrhagia. Work up included a sonohysterogram that revealed a fibroid uterus, but no fibroids in her endometrial cavity. She was noted to have endometrial polyps. The patient did get a blood transfusion and has been getting iron transfusions with Hematology. Her last hgb was 11.4 gm/dl on 11/09/16.  The risks of the surgery were reviewed with the patient and the consent form was signed prior to her surgery.  Findings: EUA: slightly enlarged, mobile uterus. No adnexal masses. Hysteroscopy: normal tubal ostia bilaterally, several endometrial polyps were noted.   Specimens: endometrial polyps, endometrial curettage   Procedure: The patient was taken to the operating room with an IV in place. She was placed in the dorsal lithotomy position and anesthesia was administered. She was prepped and draped in the usual sterile fashion for a vaginal procedure. She voided on the way to the OR. A weighted speculum was placed in the vagina and a single tooth tenaculum was placed on the anterior lip of the cervix. The cervix was dilated to a #7 hagar dilator. The uterus was sounded to 12 cm. The myosure hysteroscope was inserted into the uterine cavity. With continuous infusion of normal saline, the uterine cavity was visualized with the above findings. The myosure reach resectoscope was used to resect the endometrial polyps. The myosure was then removed. The cavity was then curetted with the small sharp curette. The cavity had the  characteristically gritty texture at the end of the procedure. The curette and the single tooth tenaculum were removed. Oozing from the tenaculum site was stopped with pressure. The speculum was removed. The patients perineum was cleansed of betadine and she was taken out of the dorsal lithotomy position.  Upon awakening the LMA was removed and the patient was transferred to the recovery room in stable and awake condition.  The sponge and instrument count were correct. There were no complications.    CC: Laverna Peace, NP

## 2016-11-13 NOTE — Transfer of Care (Signed)
Immediate Anesthesia Transfer of Care Note  Patient: Stacy Miles  Procedure(s) Performed: Procedure(s): DILATATION & CURETTAGE/HYSTEROSCOPY WITH MYOSURE (N/A)  Patient Location: PACU  Anesthesia Type:General  Level of Consciousness: awake, alert  and oriented  Airway & Oxygen Therapy: Patient Spontanous Breathing and Patient connected to nasal cannula oxygen  Post-op Assessment: Report given to RN  Post vital signs: Reviewed and stable BP 120/68, HR 51, RR 12, SaO2 100%  Last Vitals:  Vitals:   11/13/16 0902  BP: 133/75  Pulse: 68  Resp: 16  Temp: 37.2 C  SpO2: 100%    Last Pain:  Vitals:   11/13/16 0902  TempSrc: Oral      Patients Stated Pain Goal: 4 (88/71/95 [id number])  Complications: No apparent anesthesia complications

## 2016-11-13 NOTE — Interval H&P Note (Signed)
History and Physical Interval Note:  11/13/2016 11:52 AM  Stacy Miles  has presented today for surgery, with the diagnosis of Intramenstrual bleeding, endometrial polyps, fibroid uterus, anemia  The various methods of treatment have been discussed with the patient and family. After consideration of risks, benefits and other options for treatment, the patient has consented to  Procedure(s): [place] (N/A) as a surgical intervention .  The patient's history has been reviewed, patient examined, no change in status, stable for surgery.  I have reviewed the patient's chart and labs.  Questions were answered to the patient's satisfaction.     Salvadore Dom

## 2016-11-13 NOTE — Anesthesia Preprocedure Evaluation (Signed)
Anesthesia Evaluation  Patient identified by MRN, date of birth, ID band Patient awake    Reviewed: Allergy & Precautions, NPO status , Patient's Chart, lab work & pertinent test results  Airway Mallampati: II       Dental no notable dental hx. (+) Teeth Intact   Pulmonary neg pulmonary ROS,    Pulmonary exam normal breath sounds clear to auscultation       Cardiovascular Normal cardiovascular exam Rhythm:Regular Rate:Normal     Neuro/Psych negative neurological ROS  negative psych ROS   GI/Hepatic negative GI ROS, Neg liver ROS,   Endo/Other  negative endocrine ROS  Renal/GU negative Renal ROS  negative genitourinary   Musculoskeletal negative musculoskeletal ROS (+)   Abdominal (+) + obese,   Peds  Hematology   Anesthesia Other Findings   Reproductive/Obstetrics negative OB ROS                             Anesthesia Physical Anesthesia Plan  ASA: II  Anesthesia Plan: General   Post-op Pain Management:    Induction: Intravenous  PONV Risk Score and Plan: 4 or greater and Ondansetron, Dexamethasone, Midazolam, Scopolamine patch - Pre-op, Propofol infusion and Treatment may vary due to age or medical condition  Airway Management Planned: LMA  Additional Equipment:   Intra-op Plan:   Post-operative Plan:   Informed Consent: I have reviewed the patients History and Physical, chart, labs and discussed the procedure including the risks, benefits and alternatives for the proposed anesthesia with the patient or authorized representative who has indicated his/her understanding and acceptance.   Dental advisory given  Plan Discussed with: CRNA and Surgeon  Anesthesia Plan Comments:         Anesthesia Quick Evaluation

## 2016-11-13 NOTE — Anesthesia Procedure Notes (Signed)
Procedure Name: LMA Insertion Date/Time: 11/13/2016 12:08 PM Performed by: Elenore Paddy Pre-anesthesia Checklist: Patient identified, Emergency Drugs available, Suction available, Patient being monitored and Timeout performed Patient Re-evaluated:Patient Re-evaluated prior to induction Oxygen Delivery Method: Circle system utilized Preoxygenation: Pre-oxygenation with 100% oxygen Induction Type: IV induction LMA: LMA with gastric port inserted LMA Size: 4.0 Number of attempts: 1 Placement Confirmation: positive ETCO2

## 2016-11-13 NOTE — Discharge Instructions (Signed)
DISCHARGE INSTRUCTIONS: D&C / D&E The following instructions have been prepared to help you care for yourself upon your return home.   Personal hygiene:  Use sanitary pads for vaginal drainage, not tampons.  Shower the day after your procedure.  NO tub baths, pools or Jacuzzis for 2-3 weeks.  Wipe front to back after using the bathroom.  Activity and limitations:  Do NOT drive or operate any equipment for 24 hours. The effects of anesthesia are still present and drowsiness may result.  Do NOT rest in bed all day.  Walking is encouraged.  Walk up and down stairs slowly.  You may resume your normal activity in one to two days or as indicated by your physician.  Sexual activity: NO intercourse for at least 2 weeks after the procedure, or as indicated by your physician.  Diet: Eat a light meal as desired this evening. You may resume your usual diet tomorrow.  Return to work: You may resume your work activities in one to two days or as indicated by your doctor.  What to expect after your surgery: Expect to have vaginal bleeding/discharge for 2-3 days and spotting for up to 10 days. It is not unusual to have soreness for up to 1-2 weeks. You may have a slight burning sensation when you urinate for the first day. Mild cramps may continue for a couple of days. You may have a regular period in 2-6 weeks.  Call your doctor for any of the following:  Excessive vaginal bleeding, saturating and changing one pad every hour.  Inability to urinate 6 hours after discharge from hospital.  Pain not relieved by pain medication.  Fever of 100.4 F or greater.  Unusual vaginal discharge or odor.   Call for an appointment:    Patients signature: ______________________  Nurses signature ________________________  Support person's signature_______________________     Post Anesthesia Home Care Instructions  Activity: Get plenty of rest for the remainder of the day. A responsible  individual must stay with you for 24 hours following the procedure.  For the next 24 hours, DO NOT: -Drive a car -Paediatric nurse -Drink alcoholic beverages -Take any medication unless instructed by your physician -Make any legal decisions or sign important papers.  Meals: Start with liquid foods such as gelatin or soup. Progress to regular foods as tolerated. Avoid greasy, spicy, heavy foods. If nausea and/or vomiting occur, drink only clear liquids until the nausea and/or vomiting subsides. Call your physician if vomiting continues.  Special Instructions/Symptoms: Your throat may feel dry or sore from the anesthesia or the breathing tube placed in your throat during surgery. If this causes discomfort, gargle with warm salt water. The discomfort should disappear within 24 hours.  If you had a scopolamine patch placed behind your ear for the management of post- operative nausea and/or vomiting:  1. The medication in the patch is effective for 72 hours, after which it should be removed.  Wrap patch in a tissue and discard in the trash. Wash hands thoroughly with soap and water. 2. You may remove the patch earlier than 72 hours if you experience unpleasant side effects which may include dry mouth, dizziness or visual disturbances. 3. Avoid touching the patch. Wash your hands with soap and water after contact with the patch.   NO IBUPROFEN PRODUCTS (MOTRIN, ADVIL) OR ALEVE UNTIL 6:30PM TODAY.

## 2016-11-14 ENCOUNTER — Encounter (HOSPITAL_COMMUNITY): Payer: Self-pay | Admitting: Obstetrics and Gynecology

## 2016-11-14 NOTE — Anesthesia Postprocedure Evaluation (Signed)
Anesthesia Post Note  Patient: Stacy Miles  Procedure(s) Performed: Procedure(s) (LRB): DILATATION & CURETTAGE/HYSTEROSCOPY WITH MYOSURE (N/A)     Patient location during evaluation: PACU Anesthesia Type: General Level of consciousness: awake Pain management: pain level controlled Vital Signs Assessment: post-procedure vital signs reviewed and stable Respiratory status: spontaneous breathing Cardiovascular status: stable Postop Assessment: no signs of nausea or vomiting Anesthetic complications: no    Last Vitals:  Vitals:   11/13/16 1400 11/13/16 1436  BP:  123/66  Pulse: (!) 54 (!) 58  Resp: 18 18  Temp:    SpO2: 100% 100%    Last Pain:  Vitals:   11/13/16 1436  TempSrc:   PainSc: 1    Pain Goal: Patients Stated Pain Goal: 4 (11/13/16 0902)               Myreon Wimer JR,JOHN Mateo Flow

## 2016-11-29 ENCOUNTER — Ambulatory Visit: Payer: Self-pay | Admitting: Obstetrics and Gynecology

## 2016-11-30 ENCOUNTER — Encounter: Payer: Self-pay | Admitting: Obstetrics and Gynecology

## 2016-11-30 ENCOUNTER — Ambulatory Visit (INDEPENDENT_AMBULATORY_CARE_PROVIDER_SITE_OTHER): Payer: No Typology Code available for payment source | Admitting: Obstetrics and Gynecology

## 2016-11-30 VITALS — BP 122/80 | HR 72 | Resp 16 | Wt 223.0 lb

## 2016-11-30 DIAGNOSIS — N920 Excessive and frequent menstruation with regular cycle: Secondary | ICD-10-CM

## 2016-11-30 DIAGNOSIS — Z9889 Other specified postprocedural states: Secondary | ICD-10-CM | POA: Diagnosis not present

## 2016-11-30 DIAGNOSIS — N946 Dysmenorrhea, unspecified: Secondary | ICD-10-CM

## 2016-11-30 DIAGNOSIS — D259 Leiomyoma of uterus, unspecified: Secondary | ICD-10-CM

## 2016-11-30 DIAGNOSIS — N941 Unspecified dyspareunia: Secondary | ICD-10-CM

## 2016-11-30 MED ORDER — NORETHIN ACE-ETH ESTRAD-FE 1-20 MG-MCG(24) PO TABS: 1.0000 | ORAL_TABLET | Freq: Every day | ORAL | 0 refills | Status: DC

## 2016-11-30 NOTE — Progress Notes (Signed)
GYNECOLOGY  VISIT   HPI: 41 y.o.   Single  African American  female   [place] with Stacy Miles's last menstrual period was 11/22/2016.   here for follow up. Stacy Miles is 42 s/p D&C hysteroscopy. The Stacy Miles has a fibroid uterus (not deviating her cavity) and had multiple endometrial polyps. At the time of her surgery her uterus sounded to 12 cm. Pathology with benign polyps.  No problems since her surgery. She just came off her cycle. It was a little lighter, but had more clots.  The Stacy Miles was anemic with a hgb down to 5.7 (noted at annual exam). She has had a blood transfusion and iron transfusions. F/U with Hematology next months. Last hgb was 11.4.  She has a h/o severe dysmenorrhea. She also has a 2 year h/o deep dyspareunia, very infrequent. Now without a libido.   GYNECOLOGIC HISTORY: Stacy Miles's last menstrual period was 11/22/2016. Contraception:none Menopausal hormone therapy: none         OB History    Gravida Para Term Preterm AB Living   0 0 0 0 0 0   SAB TAB Ectopic Multiple Live Births   0 0 0 0 0         Stacy Miles Active Problem List   Diagnosis Date Noted  . IDA (iron deficiency anemia) 10/04/2016    Past Medical History:  Diagnosis Date  . Anemia   . History of blood transfusion 09/2016   WL - 2 units transfused    Past Surgical History:  Procedure Laterality Date  . DILATATION & CURETTAGE/HYSTEROSCOPY WITH MYOSURE N/A 11/13/2016   Procedure: DILATATION & CURETTAGE/HYSTEROSCOPY WITH MYOSURE;  Surgeon: Salvadore Dom, MD;  Location: [facility];  Service: Gynecology;  Laterality: N/A;  . DILATION AND CURETTAGE OF UTERUS      Current Outpatient Prescriptions  Medication Sig Dispense Refill  . acetaminophen (TYLENOL) 650 MG CR tablet Take 650 mg by mouth every 8 (eight) hours as needed for pain or fever.    . folic acid (FOLVITE) 1 MG tablet Take 1 tablet (1 mg total) by mouth daily. 90 tablet 3  . Multiple Vitamin (MULTIVITAMIN WITH MINERALS) TABS tablet Take 1  tablet by mouth daily.    . naproxen sodium (ANAPROX DS) 550 MG tablet Take 1 tablet (550 mg total) by mouth 2 (two) times daily with a meal. (Stacy Miles taking differently: Take 550 mg by mouth 2 (two) times daily as needed for moderate pain. ) 30 tablet 2   No current facility-administered medications for this visit.      ALLERGIES: Stacy Miles has no known allergies.  Family History  Problem Relation Age of Onset  . Diabetes Mother   . Hypertension Mother     Social History   Social History  . Marital status: Single    Spouse name: N/A  . Number of children: N/A  . Years of education: N/A   Occupational History  . Not on file.   Social History Main Topics  . Smoking status: Never Smoker  . Smokeless tobacco: Never Used  . Alcohol use No  . Drug use: No  . Sexual activity: Yes    Birth control/ protection: None   Other Topics Concern  . Not on file   Social History Narrative  . No narrative on file    Review of Systems  Constitutional: Negative.   HENT: Negative.   Eyes: Negative.   Respiratory: Negative.   Cardiovascular: Negative.   Gastrointestinal: Negative.   Genitourinary: Negative.  Musculoskeletal: Negative.   Skin: Negative.   Neurological: Negative.   Endo/Heme/Allergies: Negative.   Psychiatric/Behavioral: Negative.     PHYSICAL EXAMINATION:    BP 122/80 (BP Location: Right Arm, Stacy Miles Position: Sitting, Cuff Size: Normal)   Pulse 72   Resp 16   Wt 223 lb (101.2 kg)   LMP 11/22/2016   BMI 35.99 kg/m     General appearance: alert, cooperative and appears stated age Abdomen: soft, non-tender; non distended, no masses,  no organomegaly   ASSESSMENT Menorrhagia leading to severe anemia Fibroid uterus, not in cavity S/P hysteroscopy, polypectomy, D&C with benign pathology Dysmenorrhea Dyspareunia (deep)    PLAN Recommended she try OCP's or the Mirena IUD. Her cavity sounded to 12 cm, she would need to use condoms with the mirena She  would like to try OCP's, no contraindications, risks reviewed (including the possibility that the fibroids could grown). Will do a f/u pill check and exam in 3 months   An After Visit Summary was printed and given to the Stacy Miles.  25 minutes face to face time of which over 50% was spent in counseling.

## 2016-11-30 NOTE — Patient Instructions (Signed)
Oral Contraception Information Oral contraceptive pills (OCPs) are medicines taken to prevent pregnancy. OCPs work by preventing the ovaries from releasing eggs. The hormones in OCPs also cause the cervical mucus to thicken, preventing the sperm from entering the uterus. The hormones also cause the uterine lining to become thin, not allowing a fertilized egg to attach to the inside of the uterus. OCPs are highly effective when taken exactly as prescribed. However, OCPs do not prevent sexually transmitted diseases (STDs). Safe sex practices, such as using condoms along with the pill, can help prevent STDs. Before taking the pill, you may have a physical exam and Pap test. Your health care provider may order blood tests. The health care provider will make sure you are a good candidate for oral contraception. Discuss with your health care provider the possible side effects of the OCP you may be prescribed. When starting an OCP, it can take 2 to 3 months for the body to adjust to the changes in hormone levels in your body. Types of oral contraception  The combination pill-This pill contains estrogen and progestin (synthetic progesterone) hormones. The combination pill comes in 21-day, 28-day, or 91-day packs. Some types of combination pills are meant to be taken continuously (365-day pills). With 21-day packs, you do not take pills for 7 days after the last pill. With 28-day packs, the pill is taken every day. The last 7 pills are without hormones. Certain types of pills have more than 21 hormone-containing pills. With 91-day packs, the first 84 pills contain both hormones, and the last 7 pills contain no hormones or contain estrogen only.  The minipill-This pill contains the progesterone hormone only. The pill is taken every day continuously. It is very important to take the pill at the same time each day. The minipill comes in packs of 28 pills. All 28 pills contain the hormone. Advantages of oral  contraceptive pills  Decreases premenstrual symptoms.  Treats menstrual period cramps.  Regulates the menstrual cycle.  Decreases a heavy menstrual flow.  May treatacne, depending on the type of pill.  Treats abnormal uterine bleeding.  Treats polycystic ovarian syndrome.  Treats endometriosis.  Can be used as emergency contraception. Things that can make oral contraceptive pills less effective OCPs can be less effective if:  You forget to take the pill at the same time every day.  You have a stomach or intestinal disease that lessens the absorption of the pill.  You take OCPs with other medicines that make OCPs less effective, such as antibiotics, certain HIV medicines, and some seizure medicines.  You take expired OCPs.  You forget to restart the pill on day 7, when using the packs of 21 pills.  Risks associated with oral contraceptive pills Oral contraceptive pills can sometimes cause side effects, such as:  Headache.  Nausea.  Breast tenderness.  Irregular bleeding or spotting.  Combination pills are also associated with a small increased risk of:  Blood clots.  Heart attack.  Stroke.  This information is not intended to replace advice given to you by your health care provider. Make sure you discuss any questions you have with your health care provider. Document Released: 06/03/2002 Document Revised: 08/19/2015 Document Reviewed: 09/01/2012 Elsevier Interactive Patient Education  2018 Elsevier Inc.  

## 2017-01-11 ENCOUNTER — Other Ambulatory Visit (HOSPITAL_BASED_OUTPATIENT_CLINIC_OR_DEPARTMENT_OTHER): Payer: 59

## 2017-01-11 ENCOUNTER — Ambulatory Visit (HOSPITAL_BASED_OUTPATIENT_CLINIC_OR_DEPARTMENT_OTHER): Payer: 59 | Admitting: Family

## 2017-01-11 VITALS — BP 134/62 | HR 76 | Temp 98.5°F | Resp 18 | Wt 233.0 lb

## 2017-01-11 DIAGNOSIS — D500 Iron deficiency anemia secondary to blood loss (chronic): Secondary | ICD-10-CM | POA: Diagnosis not present

## 2017-01-11 DIAGNOSIS — N920 Excessive and frequent menstruation with regular cycle: Secondary | ICD-10-CM

## 2017-01-11 LAB — IRON AND TIBC
%SAT: 11 % — ABNORMAL LOW (ref 21–57)
Iron: 47 ug/dL (ref 41–142)
TIBC: 416 ug/dL (ref 236–444)
UIBC: 370 ug/dL (ref 120–384)

## 2017-01-11 LAB — CBC WITH DIFFERENTIAL (CANCER CENTER ONLY)
BASO#: 0 10*3/uL (ref 0.0–0.2)
BASO%: 0.5 % (ref 0.0–2.0)
EOS%: 1.6 % (ref 0.0–7.0)
Eosinophils Absolute: 0.1 10*3/uL (ref 0.0–0.5)
HCT: 33.7 % — ABNORMAL LOW (ref 34.8–46.6)
HGB: 11.8 g/dL (ref 11.6–15.9)
LYMPH#: 1.3 10*3/uL (ref 0.9–3.3)
LYMPH%: 35.8 % (ref 14.0–48.0)
MCH: 30.1 pg (ref 26.0–34.0)
MCHC: 35 g/dL (ref 32.0–36.0)
MCV: 86 fL (ref 81–101)
MONO#: 0.3 10*3/uL (ref 0.1–0.9)
MONO%: 8.6 % (ref 0.0–13.0)
NEUT#: 2 10*3/uL (ref 1.5–6.5)
NEUT%: 53.5 % (ref 39.6–80.0)
PLATELETS: 329 10*3/uL (ref 145–400)
RBC: 3.92 10*6/uL (ref 3.70–5.32)
RDW: 13.4 % (ref 11.1–15.7)
WBC: 3.7 10*3/uL — AB (ref 3.9–10.0)

## 2017-01-11 LAB — FERRITIN: FERRITIN: 14 ng/mL (ref 9–269)

## 2017-01-11 NOTE — Progress Notes (Signed)
Hematology and Oncology Follow Up Visit  Stacy Miles [id number] 08-28-1975 41 y.o. 01/11/2017   Principle Diagnosis:  Iron deficiency anemia secondary to menorrhagia   Current Therapy:   IV iron as indicated - last received in August 2018   Interim History:  Stacy Miles is here today for follow-up. She had a D&C hysteroscopy in August and is now on [place]. She states that her cycle now waxes and wanes some heavy and light days. She has had some clots.  She denies fatigue and states that she is feeling good right now.  No other bleeding, no bruising or petechiae. Hgb is stable at 11.8 and MCV of 86. She is taking her folic acid daily as prescribed.  No fever, chills, n/v, cough,r ash, dizziness, SOB, chest pain, palpitations, abdominal pain or changes in bowel or bladder habits.  No swelling, tenderness, numbness or tingling in her extremities. No c/o pain.  She has maintained a good appetite and is staying well hydrated. Her weight is up 10 ;bs since her last visit in August.   ECOG Performance Status: 0 - Asymptomatic  Medications:  Allergies as of 01/11/2017   No Known Allergies     Medication List       Accurate as of 01/11/17 10:42 AM. Always use your most recent med list.          acetaminophen 650 MG CR tablet Commonly known as:  TYLENOL Take 650 mg by mouth every 8 (eight) hours as needed for pain or fever.   folic acid 1 MG tablet Commonly known as:  FOLVITE Take 1 tablet (1 mg total) by mouth daily.   multivitamin with minerals Tabs tablet Take 1 tablet by mouth daily.   naproxen sodium 550 MG tablet Commonly known as:  ANAPROX DS Take 1 tablet (550 mg total) by mouth 2 (two) times daily with a meal.   Norethindrone Acetate-Ethinyl Estrad-FE 1-20 MG-MCG(24) tablet Commonly known as:  LOESTRIN 24 FE Take 1 tablet by mouth daily.       Allergies: No Known Allergies  Past Medical History, Surgical history, Social history, and Family History were  reviewed and updated.  Review of Systems: All other 10 point review of systems is negative.   Physical Exam:  vitals were not taken for this visit.  Wt Readings from Last 3 Encounters:  11/30/16 223 lb (101.2 kg)  10/23/16 219 lb (99.3 kg)  11/09/16 220 lb 12.8 oz (100.2 kg)    Ocular: Sclerae unicteric, pupils equal, round and reactive to light Ear-nose-throat: Oropharynx clear, dentition fair Lymphatic: No cervical, supraclavicular or axillary adenopathy Lungs no rales or rhonchi, good excursion bilaterally Heart regular rate and rhythm, no murmur appreciated Abd soft, nontender, positive bowel sounds, no liver or spleen tip palpated on exam, no fluid wave MSK no focal spinal tenderness, no joint edema Neuro: non-focal, well-oriented, appropriate affect Breasts: Deferred   Lab Results  Component Value Date   WBC 3.7 (L) 01/11/2017   HGB 11.8 01/11/2017   HCT 33.7 (L) 01/11/2017   MCV 86 01/11/2017   PLT 329 01/11/2017   Lab Results  Component Value Date   FERRITIN 74 11/09/2016   IRON 50 11/09/2016   TIBC 310 11/09/2016   UIBC 260 11/09/2016   IRONPCTSAT 16 (L) 11/09/2016   Lab Results  Component Value Date   RBC 3.92 01/11/2017   No results found for: KPAFRELGTCHN, LAMBDASER, KAPLAMBRATIO No results found for: IGGSERUM, IGA, IGMSERUM No results found for: TOTALPROTELP, ALBUMINELP, A1GS, A2GS,  Stacy Miles, SPEI   Chemistry      Component Value Date/Time   NA 135 09/29/2016 0820   K 3.6 09/29/2016 0820   CL 109 (H) 09/29/2016 0820   CO2 21 09/29/2016 0820   BUN 7 09/29/2016 0820   CREATININE 0.5 (L) 09/29/2016 0820      Component Value Date/Time   CALCIUM 8.7 09/29/2016 0820   ALKPHOS 82 09/29/2016 0820   AST 22 09/29/2016 0820   ALT 23 09/29/2016 0820   BILITOT 0.70 09/29/2016 0820      Impression and Plan: Stacy Miles is a very pleasant 41 yo African American female with iron deficiency anemia secondary to heavy cycles. She has had  a D&C and started an oral contraceptive 6 weeks ago. Her cycle is heavy at times but she is starting to have light days as well. She continues to follow-up with gynecology regularly.  We will see what her iron studies show and bring her back in later this week for an infusion if needed.  We will go ahead and plan to see her back again in another 2 months for repeat lab work and follow-up.  She will contact our office with any questions or concerns. We can certainly see her sooner if need be.   Eliezer Bottom, NP 10/18/201810:42 AM

## 2017-01-12 LAB — RETICULOCYTES: Reticulocyte Count: 1.4 % (ref 0.6–2.6)

## 2017-01-18 ENCOUNTER — Ambulatory Visit (HOSPITAL_BASED_OUTPATIENT_CLINIC_OR_DEPARTMENT_OTHER): Payer: 59

## 2017-01-18 VITALS — BP 129/57 | HR 82

## 2017-01-18 DIAGNOSIS — D500 Iron deficiency anemia secondary to blood loss (chronic): Secondary | ICD-10-CM

## 2017-01-18 DIAGNOSIS — N920 Excessive and frequent menstruation with regular cycle: Secondary | ICD-10-CM

## 2017-01-18 MED ORDER — SODIUM CHLORIDE 0.9 % IV SOLN
510.0000 mg | Freq: Once | INTRAVENOUS | Status: AC
  Administered 2017-01-18: 510 mg via INTRAVENOUS
  Filled 2017-01-18: qty 17

## 2017-01-18 NOTE — Patient Instructions (Signed)

## 2017-02-11 ENCOUNTER — Other Ambulatory Visit: Payer: Self-pay | Admitting: Obstetrics and Gynecology

## 2017-02-12 NOTE — Telephone Encounter (Signed)
Medication refill request:  OCP Last AEX:  09/21/16 JJ  Next AEX: 10/04/17  Last MMG (if hormonal medication request): none Refill authorized: 11/30/16 #[id number], 0RF. Today, please advise.

## 2017-02-12 NOTE — Telephone Encounter (Signed)
Patient has 3 month OCP follow up scheduled for 03/01/17.

## 2017-03-01 ENCOUNTER — Other Ambulatory Visit: Payer: Self-pay

## 2017-03-01 ENCOUNTER — Ambulatory Visit (INDEPENDENT_AMBULATORY_CARE_PROVIDER_SITE_OTHER): Payer: No Typology Code available for payment source | Admitting: Obstetrics and Gynecology

## 2017-03-01 ENCOUNTER — Encounter: Payer: Self-pay | Admitting: Obstetrics and Gynecology

## 2017-03-01 VITALS — BP 138/80 | HR 84 | Resp 16 | Wt 238.0 lb

## 2017-03-01 DIAGNOSIS — N946 Dysmenorrhea, unspecified: Secondary | ICD-10-CM

## 2017-03-01 DIAGNOSIS — Z3041 Encounter for surveillance of contraceptive pills: Secondary | ICD-10-CM | POA: Diagnosis not present

## 2017-03-01 DIAGNOSIS — N920 Excessive and frequent menstruation with regular cycle: Secondary | ICD-10-CM | POA: Diagnosis not present

## 2017-03-01 MED ORDER — IBUPROFEN 800 MG PO TABS: 800.0000 mg | ORAL_TABLET | Freq: Three times a day (TID) | ORAL | 1 refills | Status: DC | PRN

## 2017-03-01 MED ORDER — LEVONORGEST-ETH ESTRAD 91-DAY 0.1-0.02 & 0.01 MG PO TABS: 1.0000 | ORAL_TABLET | Freq: Every day | ORAL | 4 refills | Status: DC

## 2017-03-01 NOTE — Progress Notes (Addendum)
Review of Systems   Constitutional: Negative.    HENT: Negative.    Eyes: Negative.    Respiratory: Negative.    Cardiovascular: Negative.    Gastrointestinal: Negative.    Genitourinary: Negative.    Musculoskeletal: Negative.    Skin: Negative.    Neurological: Negative.    Endo/Heme/Allergies: Negative.    Psychiatric/Behavioral: Negative.

## 2017-03-02 NOTE — Progress Notes (Signed)
GYNECOLOGY  VISIT   HPI: 41 y.o.   Single  African American  female   [place] with Patient's last menstrual period was 02/21/2017.   here for follow up menorrhagia. The patient had a hysteroscopy, polypectomy, D&C in 8/18 (noted to have a hgb of 5.7 at her annual exam). She has fibroids, not in her cavity, uterus sounded to 12 cm. She was started on OCP's 3 months ago. Cycles not every month, last cycle bleed x 2 days. Much lighter, could have worn the pad for 6-8 hours. Cramps are just as bad, even the month she didn't bleed she had cramps.  She has been seen at hematology. Last iron transfusion was in October.   GYNECOLOGIC HISTORY: Patient's last menstrual period was 02/21/2017. Contraception:OCP Menopausal hormone therapy: none         OB History    Gravida Para Term Preterm AB Living   0 0 0 0 0 0   SAB TAB Ectopic Multiple Live Births   0 0 0 0 0         Patient Active Problem List   Diagnosis Date Noted  . IDA (iron deficiency anemia) 10/04/2016    Past Medical History:  Diagnosis Date  . Anemia   . History of blood transfusion 09/2016   [facility] - 2 units transfused    Past Surgical History:  Procedure Laterality Date  . DILATATION & CURETTAGE/HYSTEROSCOPY WITH MYOSURE N/A 11/13/2016   Procedure: DILATATION & CURETTAGE/HYSTEROSCOPY WITH MYOSURE;  Surgeon: Salvadore Dom, MD;  Location: [facility];  Service: Gynecology;  Laterality: N/A;  . DILATION AND CURETTAGE OF UTERUS      Current Outpatient Medications  Medication Sig Dispense Refill  . acetaminophen (TYLENOL) 650 MG CR tablet Take 650 mg by mouth every 8 (eight) hours as needed for pain or fever.    Marland Kitchen BLISOVI 24 FE 1-20 MG-MCG(24) tablet TAKE 1 TABLET BY MOUTH EVERY DAY 28 tablet 1  . folic acid (FOLVITE) 1 MG tablet Take 1 tablet (1 mg total) by mouth daily. 90 tablet 3  . Multiple Vitamin (MULTIVITAMIN WITH MINERALS) TABS tablet Take 1 tablet by mouth daily.    . naproxen sodium (ANAPROX DS) 550 MG tablet Take  1 tablet (550 mg total) by mouth 2 (two) times daily with a meal. (Patient taking differently: Take 550 mg by mouth 2 (two) times daily as needed for moderate pain. ) 30 tablet 2   No current facility-administered medications for this visit.      ALLERGIES: Patient has no known allergies.  Family History  Problem Relation Age of Onset  . Diabetes Mother   . Hypertension Mother     Social History   Socioeconomic History  . Marital status: Single    Spouse name: Not on file  . Number of children: Not on file  . Years of education: Not on file  . Highest education level: Not on file  Social Needs  . Financial resource strain: Not on file  . Food insecurity - worry: Not on file  . Food insecurity - inability: Not on file  . Transportation needs - medical: Not on file  . Transportation needs - non-medical: Not on file  Occupational History  . Not on file  Tobacco Use  . Smoking status: Never Smoker  . Smokeless tobacco: Never Used  Substance and Sexual Activity  . Alcohol use: No  . Drug use: No  . Sexual activity: Yes    Birth control/protection: None  Other  Topics Concern  . Not on file  Social History Narrative  . Not on file    Review of Systems  Constitutional: Negative.   HENT: Negative.   Eyes: Negative.   Respiratory: Negative.   Cardiovascular: Negative.   Gastrointestinal: Negative.   Genitourinary: Negative.   Musculoskeletal: Negative.   Skin: Negative.   Neurological: Negative.   Endo/Heme/Allergies: Negative.   Psychiatric/Behavioral: Negative.     PHYSICAL EXAMINATION:    BP 138/80 (BP Location: Right Arm, Patient Position: Sitting, Cuff Size: Large)   Pulse 84   Resp 16   Wt 238 lb (108 kg)   LMP 02/21/2017   BMI 38.41 kg/m     General appearance: alert, cooperative and appears stated age  ASSESSMENT Menorrhagia is much better with the pill Dysmenorrhea is still very bad    PLAN Change to the 3 month pill Ibuprofen for pain  (prefers to take this at work) F/U in 4 months   An After Visit Summary was printed and given to the patient.

## 2017-03-08 ENCOUNTER — Ambulatory Visit: Payer: 59 | Admitting: Family

## 2017-03-08 ENCOUNTER — Other Ambulatory Visit: Payer: 59

## 2017-03-09 ENCOUNTER — Ambulatory Visit (HOSPITAL_BASED_OUTPATIENT_CLINIC_OR_DEPARTMENT_OTHER): Payer: 59 | Admitting: Family

## 2017-03-09 ENCOUNTER — Other Ambulatory Visit: Payer: Self-pay

## 2017-03-09 ENCOUNTER — Encounter: Payer: Self-pay | Admitting: Family

## 2017-03-09 ENCOUNTER — Other Ambulatory Visit (HOSPITAL_BASED_OUTPATIENT_CLINIC_OR_DEPARTMENT_OTHER): Payer: 59

## 2017-03-09 VITALS — BP 135/81 | HR 72 | Temp 98.6°F | Resp 16 | Wt 238.0 lb

## 2017-03-09 DIAGNOSIS — N920 Excessive and frequent menstruation with regular cycle: Secondary | ICD-10-CM | POA: Diagnosis not present

## 2017-03-09 DIAGNOSIS — D500 Iron deficiency anemia secondary to blood loss (chronic): Secondary | ICD-10-CM

## 2017-03-09 LAB — CBC WITH DIFFERENTIAL (CANCER CENTER ONLY)
BASO#: 0 10*3/uL (ref 0.0–0.2)
BASO%: 0.2 % (ref 0.0–2.0)
EOS%: 1.5 % (ref 0.0–7.0)
Eosinophils Absolute: 0.1 10*3/uL (ref 0.0–0.5)
HEMATOCRIT: 38.3 % (ref 34.8–46.6)
HEMOGLOBIN: 13.6 g/dL (ref 11.6–15.9)
LYMPH#: 1.5 10*3/uL (ref 0.9–3.3)
LYMPH%: 35.9 % (ref 14.0–48.0)
MCH: 30.3 pg (ref 26.0–34.0)
MCHC: 35.5 g/dL (ref 32.0–36.0)
MCV: 85 fL (ref 81–101)
MONO#: 0.4 10*3/uL (ref 0.1–0.9)
MONO%: 9.6 % (ref 0.0–13.0)
NEUT%: 52.8 % (ref 39.6–80.0)
NEUTROS ABS: 2.2 10*3/uL (ref 1.5–6.5)
Platelets: 254 10*3/uL (ref 145–400)
RBC: 4.49 10*6/uL (ref 3.70–5.32)
RDW: 14.2 % (ref 11.1–15.7)
WBC: 4.1 10*3/uL (ref 3.9–10.0)

## 2017-03-09 LAB — IRON AND TIBC
%SAT: 18 % — AB (ref 21–57)
IRON: 68 ug/dL (ref 41–142)
TIBC: 379 ug/dL (ref 236–444)
UIBC: 311 ug/dL (ref 120–384)

## 2017-03-09 LAB — FERRITIN: FERRITIN: 47 ng/mL (ref 9–269)

## 2017-03-09 LAB — RETICULOCYTES: RETICULOCYTE COUNT: 1.5 % (ref 0.6–2.6)

## 2017-03-09 NOTE — Progress Notes (Signed)
Hematology and Oncology Follow Up Visit  Stacy Miles [id number] 1975/12/16 41 y.o. 03/09/2017   Principle Diagnosis:  Iron deficiency anemia secondary to menorrhagia   Current Therapy:   IV iron as indicated - last received in October 2018   Interim History:  Stacy Miles is here today for follow-up. She is doing well and only has occasional fatigue. Her cycles is now once every 3 months and much lighter on birth control.  She has had no other bleeding, no bruising or petechiae. No lymphadenopathy found on exam.  She has had no fever, chills, n/v, cough, rash, dizziness, SOB, chest pain, palpitations, abdominal pain or changes in bowel or bladder habits.  No swelling or tenderness in her extremities. She has some numbness and tingling in her hands sometimes that is positional (driving/typing). No c/o pain.  She has maintained a good appetite and is staying hydrated. Her weight is stable.   ECOG Performance Status: 1 - Symptomatic but completely ambulatory  Medications:  Allergies as of 03/09/2017   No Known Allergies     Medication List        Accurate as of 03/09/17 12:24 PM. Always use your most recent med list.          acetaminophen 650 MG CR tablet Commonly known as:  TYLENOL Take 650 mg by mouth every 8 (eight) hours as needed for pain or fever.   BLISOVI 24 FE 1-20 MG-MCG(24) tablet Generic drug:  Norethindrone Acetate-Ethinyl Estrad-FE   folic acid 1 MG tablet Commonly known as:  FOLVITE Take 1 tablet (1 mg total) by mouth daily.   ibuprofen 800 MG tablet Commonly known as:  ADVIL,MOTRIN Take 1 tablet (800 mg total) by mouth every 8 (eight) hours as needed.   Levonorgestrel-Ethinyl Estradiol 0.1-0.02 & 0.01 MG tablet Commonly known as:  LOSEASONIQUE Take 1 tablet by mouth daily.   multivitamin with minerals Tabs tablet Take 1 tablet by mouth daily.       Allergies: No Known Allergies  Past Medical History, Surgical history, Social history, and  Family History were reviewed and updated.  Review of Systems: All other 10 point review of systems is negative.   Physical Exam:  weight is 238 lb (108 kg). Her oral temperature is 98.6 F (37 C). Her blood pressure is 135/81 and her pulse is 72. Her respiration is 16 and oxygen saturation is 99%.   Wt Readings from Last 3 Encounters:  03/09/17 238 lb (108 kg)  03/01/17 238 lb (108 kg)  01/11/17 233 lb (105.7 kg)    Ocular: Sclerae unicteric, pupils equal, round and reactive to light Ear-nose-throat: Oropharynx clear, dentition fair Lymphatic: No cervical, supraclavicular or axillary adenopathy Lungs no rales or rhonchi, good excursion bilaterally Heart regular rate and rhythm, no murmur appreciated Abd soft, nontender, positive bowel sounds, no liver or spleen tip palpated on exam, no fluid wave  MSK no focal spinal tenderness, no joint edema Neuro: non-focal, well-oriented, appropriate affect Breasts: Deferred   Lab Results  Component Value Date   WBC 4.1 03/09/2017   HGB 13.6 03/09/2017   HCT 38.3 03/09/2017   MCV 85 03/09/2017   PLT 254 03/09/2017   Lab Results  Component Value Date   FERRITIN 14 01/11/2017   IRON 47 01/11/2017   TIBC 416 01/11/2017   UIBC 370 01/11/2017   IRONPCTSAT 11 (L) 01/11/2017   Lab Results  Component Value Date   RBC 4.49 03/09/2017   No results found for: KPAFRELGTCHN, LAMBDASER, KAPLAMBRATIO No results  found for: IGGSERUM, IGA, IGMSERUM No results found for: Odetta Pink, SPEI   Chemistry      Component Value Date/Time   NA 135 09/29/2016 0820   K 3.6 09/29/2016 0820   CL 109 (H) 09/29/2016 0820   CO2 21 09/29/2016 0820   BUN 7 09/29/2016 0820   CREATININE 0.5 (L) 09/29/2016 0820      Component Value Date/Time   CALCIUM 8.7 09/29/2016 0820   ALKPHOS 82 09/29/2016 0820   AST 22 09/29/2016 0820   ALT 23 09/29/2016 0820   BILITOT 0.70 09/29/2016 0820      Impression  and Plan: Stacy Miles is a very pleasant 41 yo African American female with iron deficiency anemia secondary to heavy cycles which are much improved on oral contraceptive. She is feeling better and has no complaints at this time.  We will see what her iron studies show and bring her back in later this week for infusion if needed.  We will see her back in another 2 months for follow-up.  She will contact our office with any questions or concerns. We can certainly see her sooner if need be.   Laverna Peace, NP 12/14/201812:24 PM

## 2017-03-29 ENCOUNTER — Other Ambulatory Visit: Payer: Self-pay

## 2017-03-29 ENCOUNTER — Ambulatory Visit (HOSPITAL_BASED_OUTPATIENT_CLINIC_OR_DEPARTMENT_OTHER): Payer: 59

## 2017-03-29 VITALS — BP 129/50 | HR 82 | Temp 98.1°F | Resp 18

## 2017-03-29 DIAGNOSIS — D500 Iron deficiency anemia secondary to blood loss (chronic): Secondary | ICD-10-CM | POA: Diagnosis not present

## 2017-03-29 DIAGNOSIS — N920 Excessive and frequent menstruation with regular cycle: Secondary | ICD-10-CM | POA: Diagnosis not present

## 2017-03-29 MED ORDER — SODIUM CHLORIDE 0.9 % IV SOLN
510.0000 mg | Freq: Once | INTRAVENOUS | Status: AC
  Administered 2017-03-29: 510 mg via INTRAVENOUS
  Filled 2017-03-29: qty 17

## 2017-03-29 NOTE — Patient Instructions (Signed)

## 2017-05-03 ENCOUNTER — Encounter: Payer: Self-pay | Admitting: Family

## 2017-05-03 ENCOUNTER — Other Ambulatory Visit: Payer: Self-pay

## 2017-05-03 ENCOUNTER — Inpatient Hospital Stay: Payer: 59 | Attending: Family | Admitting: Family

## 2017-05-03 ENCOUNTER — Inpatient Hospital Stay: Payer: 59

## 2017-05-03 VITALS — BP 137/76 | HR 88 | Temp 98.8°F | Resp 16 | Wt 243.0 lb

## 2017-05-03 DIAGNOSIS — N920 Excessive and frequent menstruation with regular cycle: Secondary | ICD-10-CM

## 2017-05-03 DIAGNOSIS — D500 Iron deficiency anemia secondary to blood loss (chronic): Secondary | ICD-10-CM | POA: Insufficient documentation

## 2017-05-03 DIAGNOSIS — J019 Acute sinusitis, unspecified: Secondary | ICD-10-CM | POA: Diagnosis not present

## 2017-05-03 DIAGNOSIS — R05 Cough: Secondary | ICD-10-CM | POA: Insufficient documentation

## 2017-05-03 LAB — CBC WITH DIFFERENTIAL (CANCER CENTER ONLY)
BASOS PCT: 0 %
Basophils Absolute: 0 10*3/uL (ref 0.0–0.1)
EOS ABS: 0.1 10*3/uL (ref 0.0–0.5)
Eosinophils Relative: 1 %
HEMATOCRIT: 40.6 % (ref 34.8–46.6)
HEMOGLOBIN: 14.6 g/dL (ref 11.6–15.9)
Lymphocytes Relative: 40 %
Lymphs Abs: 1.9 10*3/uL (ref 0.9–3.3)
MCH: 30.9 pg (ref 26.0–34.0)
MCHC: 36 g/dL (ref 32.0–36.0)
MCV: 86 fL (ref 81.0–101.0)
MONOS PCT: 9 %
Monocytes Absolute: 0.4 10*3/uL (ref 0.1–0.9)
NEUTROS ABS: 2.4 10*3/uL (ref 1.5–6.5)
NEUTROS PCT: 50 %
Platelet Count: 255 10*3/uL (ref 145–400)
RBC: 4.72 MIL/uL (ref 3.70–5.32)
RDW: 13.5 % (ref 11.1–15.7)
WBC: 4.8 10*3/uL (ref 3.9–10.0)

## 2017-05-03 LAB — RETICULOCYTES
RBC.: 4.7 MIL/uL (ref 3.70–5.45)
RETIC CT PCT: 1.3 % (ref 0.7–2.1)
Retic Count, Absolute: 61.1 10*3/uL (ref 33.7–90.7)

## 2017-05-03 NOTE — Progress Notes (Signed)
Hematology and Oncology Follow Up Visit  Stacy Miles [id number] 1975-10-28 42 y.o. 05/03/2017   Principle Diagnosis:  Iron deficiency anemia secondary to menorrhagia  Current Therapy:   IV iron as indicated - last received inJanuary2019   Interim History:  Stacy Miles is here today for follow-up. She has a sinus infection with congestion and productive cough. She has had some mild SOB with exertion. She went to an urgent care this morning and was given something for her cough.  No bleeding, bruising or petechiae. No lymphadenopathy.  No fever, chills, n/v, rash, dizziness, chest pain, palpitations, abdominal pain or changes in bowel or bladder habits.  No swelling, tenderness, numbness or tingling in her extremities. No c/o pain.  She has maintained a good appetite and is staying well hydrated. Her weight is stable.   ECOG Performance Status: 1 - Symptomatic but completely ambulatory  Medications:  Allergies as of 05/03/2017   No Known Allergies     Medication List        Accurate as of 05/03/17  1:40 PM. Always use your most recent med list.          acetaminophen 650 MG CR tablet Commonly known as:  TYLENOL Take 650 mg by mouth every 8 (eight) hours as needed for pain or fever.   BLISOVI 24 FE 1-20 MG-MCG(24) tablet Generic drug:  Norethindrone Acetate-Ethinyl Estrad-FE   folic acid 1 MG tablet Commonly known as:  FOLVITE Take 1 tablet (1 mg total) by mouth daily.   ibuprofen 800 MG tablet Commonly known as:  ADVIL,MOTRIN Take 1 tablet (800 mg total) by mouth every 8 (eight) hours as needed.   Levonorgestrel-Ethinyl Estradiol 0.1-0.02 & 0.01 MG tablet Commonly known as:  LOSEASONIQUE Take 1 tablet by mouth daily.   multivitamin with minerals Tabs tablet Take 1 tablet by mouth daily.       Allergies: No Known Allergies  Past Medical History, Surgical history, Social history, and Family History were reviewed and updated.  Review of Systems: All other 10  point review of systems is negative.   Physical Exam:  vitals were not taken for this visit.   Wt Readings from Last 3 Encounters:  03/09/17 238 lb (108 kg)  03/01/17 238 lb (108 kg)  01/11/17 233 lb (105.7 kg)    Ocular: Sclerae unicteric, pupils equal, round and reactive to light Ear-nose-throat: Oropharynx clear, dentition fair Lymphatic: No cervical, supraclavicular or axillary adenopathy Lungs no rales or rhonchi, good excursion bilaterally Heart regular rate and rhythm, no murmur appreciated Abd soft, nontender, positive bowel sounds, no liver or spleen tip palpated on exam, no fluid wave  MSK no focal spinal tenderness, no joint edema Neuro: non-focal, well-oriented, appropriate affect Breasts: Deferred   Lab Results  Component Value Date   WBC 4.8 05/03/2017   HGB 13.6 03/09/2017   HCT 40.6 05/03/2017   MCV 86.0 05/03/2017   PLT 255 05/03/2017   Lab Results  Component Value Date   FERRITIN 47 03/09/2017   IRON 68 03/09/2017   TIBC 379 03/09/2017   UIBC 311 03/09/2017   IRONPCTSAT 18 (L) 03/09/2017   Lab Results  Component Value Date   RBC 4.72 05/03/2017   No results found for: KPAFRELGTCHN, LAMBDASER, KAPLAMBRATIO No results found for: IGGSERUM, IGA, IGMSERUM No results found for: Ronnald Ramp, A1GS, A2GS, BETS, BETA2SER, GAMS, MSPIKE, SPEI   Chemistry      Component Value Date/Time   NA 135 09/29/2016 0820   K 3.6 09/29/2016 0820  CL 109 (H) 09/29/2016 0820   CO2 21 09/29/2016 0820   BUN 7 09/29/2016 0820   CREATININE 0.5 (L) 09/29/2016 0820      Component Value Date/Time   CALCIUM 8.7 09/29/2016 0820   ALKPHOS 82 09/29/2016 0820   AST 22 09/29/2016 0820   ALT 23 09/29/2016 0820   BILITOT 0.70 09/29/2016 0820      Impression and Plan: Stacy Miles is a very pleasant 42 yo African American female with iron deficiency anemia secondary to heavy cycles. She is getting over a cold with congestion, cough and mild occasional SOB.   We will  see what her iron studies show and bring her back in for infusion if needed.  We will plan to see her back in another 3 months for follow-up.  She will contact our office with any questions or concerns. We can certainly see her sooner if need be.  Laverna Peace, NP 2/7/20191:40 PM

## 2017-05-04 LAB — IRON AND TIBC
Iron: 114 ug/dL (ref 41–142)
SATURATION RATIOS: 36 % (ref 21–57)
TIBC: 313 ug/dL (ref 236–444)
UIBC: 199 ug/dL

## 2017-05-04 LAB — FERRITIN: Ferritin: 214 ng/mL (ref 9–269)

## 2017-05-15 ENCOUNTER — Telehealth: Payer: Self-pay | Admitting: Obstetrics and Gynecology

## 2017-05-15 NOTE — Telephone Encounter (Signed)
I'm so happy to see that she is not anemic. Spotting on the pill is fine, call with heavy bleeding or any other concerns. Otherwise f/u as scheduled in 4/19

## 2017-05-15 NOTE — Telephone Encounter (Signed)
Patient has an issue she would like to speak with the nurse about.

## 2017-05-15 NOTE — Telephone Encounter (Signed)
Call returned, mailbox full, unable to leave message.

## 2017-05-15 NOTE — Telephone Encounter (Signed)
Spoke with patient, advised as seen below per Dr. Talbert Nan. Patient verbalizes understanding and is agreeable.   Routing to provider for final review. Patient is agreeable to disposition. Will close encounter.

## 2017-05-15 NOTE — Telephone Encounter (Signed)
Spoke with patient, calling with medication update for Dr. Talbert Nan.   Hx of fibroids, heavy cycles and anemia. Followed by hematology, iron studies on 05/03/16 WNL.   Started Loseasonique, in 2nd week of 2nd pack, has been spotting for 2 wks. No missed or late pills. Changing 1 pad per day. Patient states she is aware BTB bleeding is common with new OCP, was advised to call with any bleeding before cycle in 3 months d/t hx.  Denies SOB, lightheaded, weakness.   Advised to continue to monitor cycles, return call to office if bleeding increases to changing 1 -2 pad/hr. Will review with Dr. Talbert Nan and return call with any additional  recommendations, patient is agreeable.    Dr. Talbert Nan -please review.

## 2017-06-07 ENCOUNTER — Encounter: Payer: Self-pay | Admitting: Internal Medicine

## 2017-06-07 ENCOUNTER — Ambulatory Visit: Payer: 59 | Admitting: Internal Medicine

## 2017-06-07 VITALS — BP 134/78 | HR 91 | Temp 97.6°F | Ht 66.0 in | Wt 239.6 lb

## 2017-06-07 DIAGNOSIS — E669 Obesity, unspecified: Secondary | ICD-10-CM

## 2017-06-07 DIAGNOSIS — Z13220 Encounter for screening for lipoid disorders: Secondary | ICD-10-CM | POA: Diagnosis not present

## 2017-06-07 DIAGNOSIS — Z0184 Encounter for antibody response examination: Secondary | ICD-10-CM | POA: Diagnosis not present

## 2017-06-07 DIAGNOSIS — Z0000 Encounter for general adult medical examination without abnormal findings: Secondary | ICD-10-CM | POA: Diagnosis not present

## 2017-06-07 DIAGNOSIS — D500 Iron deficiency anemia secondary to blood loss (chronic): Secondary | ICD-10-CM

## 2017-06-07 DIAGNOSIS — Z1231 Encounter for screening mammogram for malignant neoplasm of breast: Secondary | ICD-10-CM | POA: Diagnosis not present

## 2017-06-07 DIAGNOSIS — E559 Vitamin D deficiency, unspecified: Secondary | ICD-10-CM

## 2017-06-07 DIAGNOSIS — R739 Hyperglycemia, unspecified: Secondary | ICD-10-CM | POA: Diagnosis not present

## 2017-06-07 DIAGNOSIS — Z1329 Encounter for screening for other suspected endocrine disorder: Secondary | ICD-10-CM

## 2017-06-07 NOTE — Progress Notes (Signed)
Chief Complaint  Patient presents with  . Establish Care   Establish care  1. She reports she wants to lose weight but has poor diet and does not exercise. She does not want to try wt loss pills goal wt for ht is 154/55 but has been as low as 175/190 lbs.  2. She reports recent URI went to urgent care x 2 on 2nd time given antibiotic and helped  3. H/o painful cycles with heavy bleeding follows with OB/GYN she was bleeding so much had iron def anemia. Still has fibroid intact but had cervical polyps removed. She had to have 2 bags of pRBCs and iron infusions now she takes oral iron.   3. She reports she does snore but unkown if she stops breathing.     Review of Systems  Constitutional: Negative for weight loss.  HENT: Negative for hearing loss.   Eyes: Negative for blurred vision.  Respiratory: Negative for shortness of breath.   Cardiovascular: Negative for chest pain.  Gastrointestinal: Negative for abdominal pain.  Genitourinary:       +cycles improved on OCP  Musculoskeletal: Negative for falls.  Skin: Negative for rash.  Neurological: Negative for headaches.  Psychiatric/Behavioral: Negative for depression.   Past Medical History:  Diagnosis Date  . Anemia   . Dysmenorrhea   . Fibroid   . History of blood transfusion 09/2016   [facility] - 2 units transfused  . Iron deficiency anemia    Past Surgical History:  Procedure Laterality Date  . DILATATION & CURETTAGE/HYSTEROSCOPY WITH MYOSURE N/A 11/13/2016   Procedure: DILATATION & CURETTAGE/HYSTEROSCOPY WITH MYOSURE;  Surgeon: Salvadore Dom, MD;  Location: [facility];  Service: Gynecology;  Laterality: N/A;  . DILATION AND CURETTAGE OF UTERUS    . OTHER SURGICAL HISTORY     cervical polyp removal    Family History  Problem Relation Age of Onset  . Diabetes Mother   . Hypertension Mother   . Arthritis Mother   . Asthma Mother   . COPD Father    Social History   Socioeconomic History  . Marital status: Single    Spouse  name: Not on file  . Number of children: Not on file  . Years of education: Not on file  . Highest education level: Not on file  Social Needs  . Financial resource strain: Not on file  . Food insecurity - worry: Not on file  . Food insecurity - inability: Not on file  . Transportation needs - medical: Not on file  . Transportation needs - non-medical: Not on file  Occupational History  . Not on file  Tobacco Use  . Smoking status: Never Smoker  . Smokeless tobacco: Never Used  Substance and Sexual Activity  . Alcohol use: No  . Drug use: No  . Sexual activity: Yes    Birth control/protection: None  Other Topics Concern  . Not on file  Social History Narrative   In school for criminal justice   Works full and part time job    Masters degree, Freight forwarder    No kids    Lives in [place]    Current Meds  Medication Sig  . acetaminophen (TYLENOL) 650 MG CR tablet Take 650 mg by mouth every 8 (eight) hours as needed for pain or fever.  . folic acid (FOLVITE) 1 MG tablet Take 1 tablet (1 mg total) by mouth daily.  Marland Kitchen ibuprofen (ADVIL,MOTRIN) 800 MG tablet Take 1 tablet (800 mg total) by mouth every  8 (eight) hours as needed.  . Levonorgestrel-Ethinyl Estradiol (LOSEASONIQUE) 0.1-0.02 & 0.01 MG tablet Take 1 tablet by mouth daily.  . Multiple Vitamin (MULTIVITAMIN WITH MINERALS) TABS tablet Take 1 tablet by mouth daily.  . [DISCONTINUED] BLISOVI 24 FE 1-20 MG-MCG(24) tablet   . [DISCONTINUED] promethazine-dextromethorphan (PROMETHAZINE-DM) 6.25-15 MG/5ML syrup    No Known Allergies Recent Results (from the past 2160 hour(s))  CBC w/Diff     Status: None   Collection Time: 05/03/17  1:10 PM  Result Value Ref Range   WBC Count 4.8 3.9 - 10.0 K/uL   RBC 4.72 3.70 - 5.32 MIL/uL   Hemoglobin 14.6 11.6 - 15.9 g/dL   HCT 40.6 34.8 - 46.6 %   MCV 86.0 81.0 - 101.0 fL   MCH 30.9 26.0 - 34.0 pg   MCHC 36.0 32.0 - 36.0 g/dL   RDW 13.5 11.1 - 15.7 %   Platelet Count 255 145 - 400 K/uL    Neutrophils Relative % 50 %   Neutro Abs 2.4 1.5 - 6.5 K/uL   Lymphocytes Relative 40 %   Lymphs Abs 1.9 0.9 - 3.3 K/uL   Monocytes Relative 9 %   Monocytes Absolute 0.4 0.1 - 0.9 K/uL   Eosinophils Relative 1 %   Eosinophils Absolute 0.1 0.0 - 0.5 K/uL   Basophils Relative 0 %   Basophils Absolute 0.0 0.0 - 0.1 K/uL    Comment: Performed at [facility] at [facility], [address], [place], [place] [address]  Reticulocytes     Status: None   Collection Time: 05/03/17  1:10 PM  Result Value Ref Range   Retic Ct Pct 1.3 0.7 - 2.1 %   RBC. 4.70 3.70 - 5.45 MIL/uL   Retic Count, Absolute 61.1 33.7 - 90.7 K/uL    Comment: Performed at [facility], [place] [address] [place], [place] [address]  Ferritin     Status: None   Collection Time: 05/03/17  1:11 PM  Result Value Ref Range   Ferritin 214 9 - 269 ng/mL    Comment: Performed at [facility], [place] [address] [place], [place] [address]  Iron and TIBC     Status: None   Collection Time: 05/03/17  1:11 PM  Result Value Ref Range   Iron 114 41 - 142 ug/dL   TIBC 313 236 - 444 ug/dL   Saturation Ratios 36 21 - 57 %   UIBC 199 ug/dL    Comment: Performed at [facility], [address], [place], [place] [address]   Objective  Body mass index is 38.67 kg/m. Wt Readings from Last 3 Encounters:  06/07/17 239 lb 9.6 oz (108.7 kg)  05/03/17 243 lb (110.2 kg)  03/09/17 238 lb (108 kg)   Temp Readings from Last 3 Encounters:  06/07/17 97.6 F (36.4 C) (Oral)  05/03/17 98.8 F (37.1 C) (Oral)  03/29/17 98.1 F (36.7 C) (Oral)   BP Readings from Last 3 Encounters:  06/07/17 134/78  05/03/17 137/76  03/29/17 (!) 129/50   Pulse Readings from Last 3 Encounters:  06/07/17 91  05/03/17 88  03/29/17 82   O2 sat room air 98%   Physical Exam  Constitutional: She is oriented to person, place, and time and well-developed,  well-nourished, and in no distress. Vital signs are normal.  HENT:  Head: Normocephalic and atraumatic.  Mouth/Throat: Oropharynx is clear and moist and mucous membranes are normal.  Eyes: Conjunctivae are normal. Pupils are equal, round, and reactive to light.  Cardiovascular: Normal rate, regular rhythm and normal heart sounds.  Pulmonary/Chest: Effort normal and breath sounds normal.  Neurological: She is alert and oriented to person, place, and time. Gait normal. Gait normal.  Skin: Skin is warm, dry and intact.  Psychiatric: Mood, memory, affect and judgment normal.  Nursing note and vitals reviewed.   Assessment   1. Obesity  2. Iron deficiency anemia  3. HM  Plan   1.  Check labs in 2 weeks CMET, CBC, A1C (h/o hyperglycemia and she has gained wt and FH DM), TSH, T4, vitamin D, lipid, hep B. Hold on UA pt will be on cycle Disc healthy diet choices and exercise  rec lose 2 lbs by next visit pts goal 175/180 though nl BMI goal 154/55  Declines adipex disc saxenda today given info about it   2. Continue iron supplementation iron levels normal 05/03/17  3.  Declines flu shot  Tdap needs given info consider at f/u  Check hep B status  Declines HIV check   LMP 03/20/17 has then q3 months with OCP. Pap neg OB/GYN 09/26/16 neg HPV Referred for mammogram today    Provider: Dr. Olivia Mackie McLean-Scocuzza-Internal Medicine

## 2017-06-07 NOTE — Patient Instructions (Addendum)
Labs no food x 12 hours 06/21/17  F/u with me end of 06/2017  Exercising to Lose Weight Exercising can help you to lose weight. In order to lose weight through exercise, you need to do vigorous-intensity exercise. You can tell that you are exercising with vigorous intensity if you are breathing very hard and fast and cannot hold a conversation while exercising. Moderate-intensity exercise helps to maintain your current weight. You can tell that you are exercising at a moderate level if you have a higher heart rate and faster breathing, but you are still able to hold a conversation. How often should I exercise? Choose an activity that you enjoy and set realistic goals. Your health care provider can help you to make an activity plan that works for you. Exercise regularly as directed by your health care provider. This may include:  Doing resistance training twice each week, such as: ? Push-ups. ? Sit-ups. ? Lifting weights. ? Using resistance bands.  Doing a given intensity of exercise for a given amount of time. Choose from these options: ? 150 minutes of moderate-intensity exercise every week. ? 75 minutes of vigorous-intensity exercise every week. ? A mix of moderate-intensity and vigorous-intensity exercise every week.  Children, pregnant women, people who are out of shape, people who are overweight, and older adults may need to consult a health care provider for individual recommendations. If you have any sort of medical condition, be sure to consult your health care provider before starting a new exercise program. What are some activities that can help me to lose weight?  Walking at a rate of at least 4.5 miles an hour.  Jogging or running at a rate of 5 miles per hour.  Biking at a rate of at least 10 miles per hour.  Lap swimming.  Roller-skating or in-line skating.  Cross-country skiing.  Vigorous competitive sports, such as football, basketball, and soccer.  Jumping  rope.  Aerobic dancing. How can I be more active in my day-to-day activities?  Use the stairs instead of the elevator.  Take a walk during your lunch break.  If you drive, park your car farther away from work or school.  If you take public transportation, get off one stop early and walk the rest of the way.  Make all of your phone calls while standing up and walking around.  Get up, stretch, and walk around every 30 minutes throughout the day. What guidelines should I follow while exercising?  Do not exercise so much that you hurt yourself, feel dizzy, or get very short of breath.  Consult your health care provider prior to starting a new exercise program.  Wear comfortable clothes and shoes with good support.  Drink plenty of water while you exercise to prevent dehydration or heat stroke. Body water is lost during exercise and must be replaced.  Work out until you breathe faster and your heart beats faster. This information is not intended to replace advice given to you by your health care provider. Make sure you discuss any questions you have with your health care provider. Document Released: 04/15/2010 Document Revised: 08/19/2015 Document Reviewed: 08/14/2013 Elsevier Interactive Patient Education  2018 [place] on a Budget There are many ways to save money at the grocery store and continue to eat healthy. You can be successful if you plan your meals according to your budget, purchase according to your budget and grocery list, and prepare food yourself. How can I buy more food on a  limited budget? Plan  Plan meals and snacks according to a grocery list and budget you create.  Look for recipes where you can cook once and make enough food for two meals.  Include meals that will "stretch" more expensive foods such as stews, casseroles, and stir-fry dishes.  Make a grocery list and make sure to bring it with you to the store. If you have a smart phone,  you could use your phone to create your shopping list. Purchase  When grocery shopping, buy only the items on your grocery list and go only to the areas of the store that have the items on your list. Prepare  Some meal items can be prepared in advance. Pre-cook on days when you have extra time.  Make extra food (such as by doubling recipes) and freeze the extras in meal-sized containers or in individual portions for fast meals and snacks.  Use leftovers in your meal plan for the week.  Try some meatless meals or try "no cook" meals like salads.  When you come home from the grocery store, wash and prepare your fruits and vegetables so they are ready to use and eat. This will help reduce food waste. How can I buy more food on a limited budget? Try these tips the next time you go shopping:  [place] store brands or generic brands.  Use coupons only for foods and brands you normally buy. Avoid buying items you wouldn't normally buy simply because they are on sale.  Check online and in newspapers for weekly deals.  Buy healthy items from the bulk bins when available, such as herbs, spices, flours, pastas, nuts, and dried fruit.  Buy fruits and vegetables that are in season. Prices are usually lower on in-season produce.  Compare and contrast different items. You can do this by looking at the unit price on the price tag. Use it to compare different brands and sizes to find out which item is the best deal.  Choose naturally low-cost healthy items, such as carrots, potatoes, apples, bananas, and oranges. Dried or canned beans are a low-cost protein source.  Buy in bulk and freeze extra food. Items you can buy in bulk include meats, fish, poultry, frozen fruits, and frozen vegetables.  Limit the purchase of prepared or "ready-to-eat" foods, such as pre-cut fruits and vegetables and pre-made salads.  If possible, shop around to discover which grocery store offers the best prices. Some stores  charge much more than other stores for the same items.  Do not shop when you are hungry. If you shop while hungry, It may be hard to stick to your list and budget.  Stick to your list and resist impulse buys. Treat your list as your official plan for the week.  Buy a variety of vegetables and fruit by purchasing fresh, frozen, and canned items.  Look beyond eye level. Foods at eye level (adult or child eye level) are more expensive. Look at the top and bottom shelves for deals.  Be efficient with your time when shopping. The more time you spend at the store, the more money you are likely to spend.  Consider other retailers such as dollar stores, larger [employer], local fruit and vegetable stands, and farmers markets.  What are some tips for less expensive food substitutions? When choosing more expensive foods like meats and dairy, try these tips to save money:  Choose cheaper cuts of meat, such as bone-in chicken thighs and drumsticks instead skinless and boneless chicken. When  you are ready to prepare the chicken, you can remove the skin yourself to make it healthier.  Choose lean meats like chicken or [place]. When choosing ground beef, make sure it is lean ground beef (92% lean, 8% fat). If you do buy a fattier ground beef, drain the fat before eating.  Buy dried beans and peas, such as lentils, split peas, or kidney beans.  For seafood, choose canned tuna, salmon, or sardines.  Eggs are a low-cost source of protein.  Buy the larger tubs of yogurt instead of individual-sized containers.  Choose water instead of sodas and other sweetened beverages.  Skip buying chips, cookies, and other "junk food". These items are usually expensive, high in calories, and low in nutritional value.  How can I prepare the foods I buy in the healthiest way? Practice these tips for cooking foods in the healthiest way to reduce excess fat and calorie intake:  Steam, saute, grill, or bake foods  instead of frying them.  Make sure half your plate is filled with fruits or vegetables. Choose from fresh, frozen, or canned fruits and vegetables. If eating canned, remember to rinse them before eating. This will remove any excess salt added for packaging.  Trim all fat from meat before cooking. Remove the skin from chicken or [place].  Spoon off fat from meat dishes once they have been chilled in the refrigerator and the fat has hardened on the top.  Use skim milk, low-fat milk, or evaporated skim milk when making cream sauces, soups, or puddings.  Substitute low-fat yogurt, sour cream, or cottage cheese for sour cream and mayonnaise in dips and dressings.  Try lemon juice, herbs, or spices to season food instead of salt, butter, or margarine.  This information is not intended to replace advice given to you by your health care provider. Make sure you discuss any questions you have with your health care provider. Document Released: 11/14/2013 Document Revised: 10/01/2015 Document Reviewed: 10/14/2013 Elsevier Interactive Patient Education  2018 [place] Vaccine (Diphtheria, Tetanus, and Pertussis): What You Need to Know 1. Why get vaccinated? Diphtheria, tetanus, and pertussis are serious diseases caused by bacteria. Diphtheria and pertussis are spread from person to person. Tetanus enters the body through cuts or wounds. DIPHTHERIA causes a thick covering in the back of the throat.  It can lead to breathing problems, paralysis, heart failure, and even death.  TETANUS (Lockjaw) causes painful tightening of the muscles, usually all over the body.  It can lead to "locking" of the jaw so the victim cannot open his mouth or swallow. Tetanus leads to death in up to 2 out of 10 cases.  PERTUSSIS (Whooping Cough) causes coughing spells so bad that it is hard for infants to eat, drink, or breathe. These spells can last for weeks.  It can lead to pneumonia, seizures (jerking  and staring spells), brain damage, and death.  Diphtheria, tetanus, and pertussis vaccine (DTaP) can help prevent these diseases. Most children who are vaccinated with DTaP will be protected throughout childhood. Many more children would get these diseases if we stopped vaccinating. DTaP is a safer version of an older vaccine called DTP. DTP is no longer used in the [place]. 2. Who should get DTaP vaccine and when? Children should get 5 doses of DTaP vaccine, one dose at each of the following ages:  2 months  4 months  6 months  15-18 months  4-6 years  DTaP may be given at the same time  as other vaccines. 3. Some children should not get DTaP vaccine or should wait  Children with minor illnesses, such as a cold, may be vaccinated. But children who are moderately or severely ill should usually wait until they recover before getting DTaP vaccine.  Any child who had a life-threatening allergic reaction after a dose of DTaP should not get another dose.  Any child who suffered a brain or nervous system disease within 7 days after a dose of DTaP should not get another dose.  Talk with your doctor if your child: ? had a seizure or collapsed after a dose of DTaP, ? cried non-stop for 3 hours or more after a dose of DTaP, ? had a fever over 105F after a dose of DTaP. Ask your doctor for more information. Some of these children should not get another dose of pertussis vaccine, but may get a vaccine without pertussis, called DT. 4. Older children and adults DTaP is not licensed for adolescents, adults, or children 37 years of age and older. But older people still need protection. A vaccine called Tdap is similar to DTaP. A single dose of Tdap is recommended for people 11 through 41 years of age. Another vaccine, called Td, protects against tetanus and diphtheria, but not pertussis. It is recommended every 10 years. There are separate Vaccine Information Statements for these vaccines. 5.  What are the risks from DTaP vaccine? Getting diphtheria, tetanus, or pertussis disease is much riskier than getting DTaP vaccine. However, a vaccine, like any medicine, is capable of causing serious problems, such as severe allergic reactions. The risk of DTaP vaccine causing serious harm, or death, is extremely small. Mild problems (common)  Fever (up to about 1 child in 4)  Redness or swelling where the shot was given (up to about 1 child in 4)  Soreness or tenderness where the shot was given (up to about 1 child in 4) These problems occur more often after the 4th and 5th doses of the DTaP series than after earlier doses. Sometimes the 4th or 5th dose of DTaP vaccine is followed by swelling of the entire arm or leg in which the shot was given, lasting 1-7 days (up to about 1 child in 86). Other mild problems include:  Fussiness (up to about 1 child in 3)  Tiredness or poor appetite (up to about 1 child in 10)  Vomiting (up to about 1 child in 62) These problems generally occur 1-3 days after the shot. Moderate problems (uncommon)  Seizure (jerking or staring) (about 1 child out of 14,000)  Non-stop crying, for 3 hours or more (up to about 1 child out of 1,000)  High fever, over 105F (about 1 child out of 16,000) Severe problems (very rare)  Serious allergic reaction (less than 1 out of a million doses)  Several other severe problems have been reported after DTaP vaccine. These include: ? Long-term seizures, coma, or lowered consciousness ? Permanent brain damage. These are so rare it is hard to tell if they are caused by the vaccine. Controlling fever is especially important for children who have had seizures, for any reason. It is also important if another family member has had seizures. You can reduce fever and pain by giving your child an aspirin-free pain reliever when the shot is given, and for the next 24 hours, following the package instructions. 6. What if there is a  serious reaction? What should I look for? Look for anything that concerns you, such as signs  of a severe allergic reaction, very high fever, or behavior changes. Signs of a severe allergic reaction can include hives, swelling of the face and throat, difficulty breathing, a fast heartbeat, dizziness, and weakness. These would start a few minutes to a few hours after the vaccination. What should I do?  If you think it is a severe allergic reaction or other emergency that can't wait, call 9-1-1 or get the person to the nearest hospital. Otherwise, call your doctor.  Afterward, the reaction should be reported to the Vaccine Adverse Event Reporting System (VAERS). Your doctor might file this report, or you can do it yourself through the VAERS web site at www.vaers.SamedayNews.es, or by calling [phone]. ? VAERS is only for reporting reactions. They do not give medical advice. 7. The National Vaccine Injury Compensation Program The [employer] Vaccine Injury Compensation Program (VICP) is a federal program that was created to compensate people who may have been injured by certain vaccines. Persons who believe they may have been injured by a vaccine can learn about the program and about filing a claim by calling 316-436-3208 or visiting the [place] website at GoldCloset.com.ee. 8. How can I learn more?  Ask your doctor.  Call your local or state health department.  Contact the Centers for Disease Control and Prevention (CDC): ? Call (320) 621-8693 (1-800-CDC-INFO) or ? Visit CDC's website at http://hunter.com/ CDC DTaP Vaccine (Diphtheria, Tetanus, and Pertussis) VIS (08/10/05) This information is not intended to replace advice given to you by your health care provider. Make sure you discuss any questions you have with your health care provider. Document Released: 01/08/2006 Document Revised: 12/02/2015 Document Reviewed: 12/02/2015 Elsevier Interactive Patient Education  2017 [place].  Liraglutide injection (Weight Management) What is this medicine? LIRAGLUTIDE (LIR a GLOO tide) is used with a reduced calorie diet and exercise to help you lose weight. This medicine may be used for other purposes; ask your health care provider or pharmacist if you have questions. COMMON BRAND NAME(S): Saxenda What should I tell my health care provider before I take this medicine? They need to know if you have any of these conditions: -endocrine tumors (MEN 2) or if someone in your family had these tumors -gallbladder disease -high cholesterol -history of alcohol abuse problem -history of pancreatitis -kidney disease or if you are on dialysis -liver disease -previous swelling of the tongue, face, or lips with difficulty breathing, difficulty swallowing, hoarseness, or tightening of the throat -stomach problems -suicidal thoughts, plans, or attempt; a previous suicide attempt by you or a family member -thyroid cancer or if someone in your family had thyroid cancer -an unusual or allergic reaction to liraglutide, other medicines, foods, dyes, or preservatives -pregnant or trying to get pregnant -breast-feeding How should I use this medicine? This medicine is for injection under the skin of your upper leg, stomach area, or upper arm. You will be taught how to prepare and give this medicine. Use exactly as directed. Take your medicine at regular intervals. Do not take it more often than directed. It is important that you put your used needles and syringes in a special sharps container. Do not put them in a trash can. If you do not have a sharps container, call your pharmacist or healthcare provider to get one. A special MedGuide will be given to you by the pharmacist with each prescription and refill. Be sure to read this information carefully each time. Talk to your pediatrician regarding the use of this medicine in children. Special care may  be needed. Overdosage: If you think you  have taken too much of this medicine contact a poison control center or emergency room at once. NOTE: This medicine is only for you. Do not share this medicine with others. What if I miss a dose? If you miss a dose, take it as soon as you can. If it is almost time for your next dose, take only that dose. Do not take double or extra doses. If you miss your dose for 3 days or more, call your doctor or health care professional to talk about how to restart this medicine. What may interact with this medicine? -insulin and other medicines for diabetes This list may not describe all possible interactions. Give your health care provider a list of all the medicines, herbs, non-prescription drugs, or dietary supplements you use. Also tell them if you smoke, drink alcohol, or use illegal drugs. Some items may interact with your medicine. What should I watch for while using this medicine? Visit your doctor or health care professional for regular checks on your progress. This medicine is intended to be used in addition to a healthy diet and appropriate exercise. The best results are achieved this way. Do not increase or in any way change your dose without consulting your doctor or health care professional. Drink plenty of fluids while taking this medicine. Check with your doctor or health care professional if you get an attack of severe diarrhea, nausea, and vomiting. The loss of too much body fluid can make it dangerous for you to take this medicine. This medicine may affect blood sugar levels. If you have diabetes, check with your doctor or health care professional before you change your diet or the dose of your diabetic medicine. Patients and their families should watch out for worsening depression or thoughts of suicide. Also watch out for sudden changes in feelings such as feeling anxious, agitated, panicky, irritable, hostile, aggressive, impulsive, severely restless, overly excited and hyperactive, or not being  able to sleep. If this happens, especially at the beginning of treatment or after a change in dose, call your health care professional. What side effects may I notice from receiving this medicine? Side effects that you should report to your doctor or health care professional as soon as possible: -allergic reactions like skin rash, itching or hives, swelling of the face, lips, or tongue -breathing problems -diarrhea that continues or is severe -lump or swelling on the neck -severe nausea -signs and symptoms of infection like fever or chills; cough; sore throat; pain or trouble passing urine -signs and symptoms of low blood sugar such as feeling anxious, confusion, dizziness, increased hunger, unusually weak or tired, sweating, shakiness, cold, irritable, headache, blurred vision, fast heartbeat, loss of consciousness -signs and symptoms of kidney injury like trouble passing urine or change in the amount of urine -trouble swallowing -unusual stomach upset or pain -vomiting Side effects that usually do not require medical attention (report to your doctor or health care professional if they continue or are bothersome): -constipation -decreased appetite -diarrhea -fatigue -headache -nausea -pain, redness, or irritation at site where injected -stomach upset -stuffy or runny nose This list may not describe all possible side effects. Call your doctor for medical advice about side effects. You may report side effects to FDA at 1-800-FDA-1088. Where should I keep my medicine? Keep out of the reach of children. Store unopened pen in a refrigerator between 2 and 8 degrees C (36 and 46 degrees F). Do not freeze or use  if the medicine has been frozen. Protect from light and excessive heat. After you first use the pen, it can be stored at room temperature between 15 and 30 degrees C (59 and 86 degrees F) or in a refrigerator. Throw away your used pen after 30 days or after the expiration date, whichever  comes first. Do not store your pen with the needle attached. If the needle is left on, medicine may leak from the pen. NOTE: This sheet is a summary. It may not cover all possible information. If you have questions about this medicine, talk to your doctor, pharmacist, or health care provider.  2018 Elsevier/Gold Standard (2016-03-30 14:41:37)

## 2017-06-07 NOTE — Progress Notes (Signed)
Pre visit review using our clinic review tool, if applicable. No additional management support is needed unless otherwise documented below in the visit note. 

## 2017-06-21 ENCOUNTER — Other Ambulatory Visit (INDEPENDENT_AMBULATORY_CARE_PROVIDER_SITE_OTHER): Payer: 59

## 2017-06-21 DIAGNOSIS — E669 Obesity, unspecified: Secondary | ICD-10-CM | POA: Diagnosis not present

## 2017-06-21 DIAGNOSIS — Z1329 Encounter for screening for other suspected endocrine disorder: Secondary | ICD-10-CM

## 2017-06-21 DIAGNOSIS — R739 Hyperglycemia, unspecified: Secondary | ICD-10-CM

## 2017-06-21 DIAGNOSIS — Z13220 Encounter for screening for lipoid disorders: Secondary | ICD-10-CM | POA: Diagnosis not present

## 2017-06-21 DIAGNOSIS — Z0000 Encounter for general adult medical examination without abnormal findings: Secondary | ICD-10-CM | POA: Diagnosis not present

## 2017-06-21 DIAGNOSIS — E559 Vitamin D deficiency, unspecified: Secondary | ICD-10-CM | POA: Diagnosis not present

## 2017-06-21 DIAGNOSIS — Z0184 Encounter for antibody response examination: Secondary | ICD-10-CM | POA: Diagnosis not present

## 2017-06-21 LAB — COMPREHENSIVE METABOLIC PANEL
ALBUMIN: 3.8 g/dL (ref 3.5–5.2)
ALT: 16 U/L (ref 0–35)
AST: 18 U/L (ref 0–37)
Alkaline Phosphatase: 66 U/L (ref 39–117)
BUN: 12 mg/dL (ref 6–23)
CALCIUM: 9.1 mg/dL (ref 8.4–10.5)
CHLORIDE: 106 meq/L (ref 96–112)
CO2: 23 mEq/L (ref 19–32)
CREATININE: 0.69 mg/dL (ref 0.40–1.20)
GFR: 120.82 mL/min (ref >60.00)
Glucose, Bld: 92 mg/dL (ref 70–99)
Potassium: 4 mEq/L (ref 3.5–5.1)
Sodium: 137 mEq/L (ref 135–145)
Total Bilirubin: 0.4 mg/dL (ref 0.2–1.2)
Total Protein: 7.4 g/dL (ref 6.0–8.3)

## 2017-06-21 LAB — LIPID PANEL
CHOL/HDL RATIO: 3
Cholesterol: 187 mg/dL (ref 0–200)
HDL: 68.4 mg/dL (ref >39.00)
LDL Cholesterol: 108 mg/dL — ABNORMAL HIGH (ref 0–99)
NONHDL: 118.15
Triglycerides: 52 mg/dL (ref 0.0–149.0)
VLDL: 10.4 mg/dL (ref 0.0–40.0)

## 2017-06-21 LAB — CBC WITH DIFFERENTIAL/PLATELET
BASOS PCT: 0.6 % (ref 0.0–3.0)
Basophils Absolute: 0 10*3/uL (ref 0.0–0.1)
Eosinophils Absolute: 0.1 10*3/uL (ref 0.0–0.7)
Eosinophils Relative: 1.5 % (ref 0.0–5.0)
HCT: 42.2 % (ref 36.0–46.0)
HEMOGLOBIN: 14.8 g/dL (ref 12.0–15.0)
LYMPHS PCT: 43.7 % (ref 12.0–46.0)
Lymphs Abs: 1.7 10*3/uL (ref 0.7–4.0)
MCHC: 34.9 g/dL (ref 30.0–36.0)
MCV: 90.4 fl (ref 78.0–100.0)
Monocytes Absolute: 0.4 10*3/uL (ref 0.1–1.0)
Monocytes Relative: 9.1 % (ref 3.0–12.0)
Neutro Abs: 1.7 10*3/uL (ref 1.4–7.7)
Neutrophils Relative %: 45.1 % (ref 43.0–77.0)
Platelets: 277 10*3/uL (ref 150.0–400.0)
RBC: 4.67 Mil/uL (ref 3.87–5.11)
RDW: 13.4 % (ref 11.5–15.5)
WBC: 3.9 10*3/uL — AB (ref 4.0–10.5)

## 2017-06-21 LAB — VITAMIN D 25 HYDROXY (VIT D DEFICIENCY, FRACTURES): VITD: 7.03 ng/mL — AB (ref 30.00–100.00)

## 2017-06-21 LAB — T4, FREE: Free T4: 0.77 ng/dL (ref 0.60–1.60)

## 2017-06-21 LAB — TSH: TSH: 0.9 u[IU]/mL (ref 0.35–4.50)

## 2017-06-21 LAB — HEMOGLOBIN A1C: HEMOGLOBIN A1C: 5.4 % (ref 4.6–6.5)

## 2017-06-22 LAB — HEPATITIS B SURFACE ANTIBODY, QUANTITATIVE

## 2017-06-24 ENCOUNTER — Other Ambulatory Visit: Payer: Self-pay | Admitting: Internal Medicine

## 2017-06-24 DIAGNOSIS — E559 Vitamin D deficiency, unspecified: Secondary | ICD-10-CM | POA: Insufficient documentation

## 2017-06-24 MED ORDER — CHOLECALCIFEROL 1.25 MG (50000 UT) PO CAPS: 50000.0000 [IU] | ORAL_CAPSULE | ORAL | 1 refills | Status: DC

## 2017-06-25 ENCOUNTER — Telehealth: Payer: Self-pay | Admitting: Internal Medicine

## 2017-06-25 NOTE — Telephone Encounter (Signed)
Please see result note 

## 2017-06-25 NOTE — Telephone Encounter (Signed)
Copied from [place] [id number]. Topic: General - Call Back - No Documentation >> Jun 25, 2017 11:48 AM Ahmed Prima L wrote: Reason for CRM: pt said that Fransisco Beau called her and she missed the call. Please call patient back at her work [phone]

## 2017-07-04 ENCOUNTER — Telehealth: Payer: Self-pay | Admitting: Internal Medicine

## 2017-07-04 NOTE — Telephone Encounter (Signed)
Please advise 

## 2017-07-04 NOTE — Telephone Encounter (Signed)
mychart message has been sent

## 2017-07-04 NOTE — Telephone Encounter (Signed)
Copied from [place] [id number]. Topic: General >> Jul 04, 2017 12:25 PM Margot Ables wrote: Pt stating she has another sore throat and chest cold. She can feel the "cold in her chest" when she is breathing. Pt is requesting abx or for advice on something to take. She is thinking it is from the weather change.  [id number] [address], [place] [phone] (Phone) [phone] (Fax)

## 2017-07-04 NOTE — Telephone Encounter (Signed)
Sch appt Can try warm salt gargles, OTC Mucinex DM green label or Robitussin DM  cepacol drops for sore throat   Does she have asthma?  [place]

## 2017-07-05 ENCOUNTER — Other Ambulatory Visit: Payer: Self-pay

## 2017-07-05 ENCOUNTER — Ambulatory Visit: Payer: No Typology Code available for payment source | Admitting: Obstetrics and Gynecology

## 2017-07-05 ENCOUNTER — Encounter: Payer: Self-pay | Admitting: Obstetrics and Gynecology

## 2017-07-05 VITALS — BP 124/70 | HR 84 | Resp 16 | Wt 242.0 lb

## 2017-07-05 DIAGNOSIS — N946 Dysmenorrhea, unspecified: Secondary | ICD-10-CM | POA: Diagnosis not present

## 2017-07-05 DIAGNOSIS — Z3041 Encounter for surveillance of contraceptive pills: Secondary | ICD-10-CM | POA: Diagnosis not present

## 2017-07-05 DIAGNOSIS — Z862 Personal history of diseases of the blood and blood-forming organs and certain disorders involving the immune mechanism: Secondary | ICD-10-CM | POA: Diagnosis not present

## 2017-07-05 NOTE — Progress Notes (Signed)
GYNECOLOGY  VISIT   HPI: 42 y.o.   Single  African American  female   [place] with Patient's last menstrual period was 04/28/2017.   here for follow up OCP. She has a fibroid uterus (not in her lining) and a h/o menorrhagia. She had a hysteroscopy, polypectomy and D&C in 8/18 (after she was noted to have a hgb of 5.7 at routine lab work at an annual exam). She also has a h/o severe dysmenorrhea.  She was started on OCP's in the fall of 2018. When she was seen in 12/18 she reported lighter bleeding, but still with severe cramps. She was changed to [facility] in 12/18 and is here for f/u. She is on her second 3 month pack. She had some BTB in the second month and then spotted for 3 weeks in March. No cycle the last week of the pills. She is on her second week of the new pack.  Her cramping was less often with this pill. Still just as bad, but only for the last week of the 3 month pack. The cramps are tolerable with the ibuprofen. Recent Hgb was 14.8 gm/dl. She is scheduled for her mammogram next week.  She isn't sure about having children. Aware that it gets more difficult to get pregnant as she ages and increased complications with pregnancy. She will let me know if she wants to further pursue this as an option.   GYNECOLOGIC HISTORY: Patient's last menstrual period was 04/28/2017. Contraception:OCP Menopausal hormone therapy: none         OB History    Gravida  0   Para  0   Term  0   Preterm  0   AB  0   Living  0     SAB  0   TAB  0   Ectopic  0   Multiple  0   Live Births  0              Patient Active Problem List   Diagnosis Date Noted  . Vitamin D deficiency 06/24/2017  . Obesity (BMI 35.0-39.9 without comorbidity) 06/07/2017  . IDA (iron deficiency anemia) 10/04/2016    Past Medical History:  Diagnosis Date  . Anemia   . Dysmenorrhea   . Fibroid   . History of blood transfusion 09/2016   [facility] - 2 units transfused  . Iron deficiency anemia     Past  Surgical History:  Procedure Laterality Date  . DILATATION & CURETTAGE/HYSTEROSCOPY WITH MYOSURE N/A 11/13/2016   Procedure: DILATATION & CURETTAGE/HYSTEROSCOPY WITH MYOSURE;  Surgeon: Salvadore Dom, MD;  Location: [facility];  Service: Gynecology;  Laterality: N/A;  . DILATION AND CURETTAGE OF UTERUS    . OTHER SURGICAL HISTORY     cervical polyp removal     Current Outpatient Medications  Medication Sig Dispense Refill  . acetaminophen (TYLENOL) 650 MG CR tablet Take 650 mg by mouth every 8 (eight) hours as needed for pain or fever.    . Cholecalciferol 50000 units capsule Take 1 capsule (50,000 Units total) by mouth once a week. 13 capsule 1  . folic acid (FOLVITE) 1 MG tablet Take 1 tablet (1 mg total) by mouth daily. 90 tablet 3  . ibuprofen (ADVIL,MOTRIN) 800 MG tablet Take 1 tablet (800 mg total) by mouth every 8 (eight) hours as needed. 30 tablet 1  . Levonorgestrel-Ethinyl Estradiol (LOSEASONIQUE) 0.1-0.02 & 0.01 MG tablet Take 1 tablet by mouth daily. 1 Package 4  . Multiple Vitamin (  MULTIVITAMIN WITH MINERALS) TABS tablet Take 1 tablet by mouth daily.     No current facility-administered medications for this visit.      ALLERGIES: Patient has no known allergies.  Family History  Problem Relation Age of Onset  . Diabetes Mother   . Hypertension Mother   . Arthritis Mother   . Asthma Mother   . COPD Father     Social History   Socioeconomic History  . Marital status: Single    Spouse name: Not on file  . Number of children: Not on file  . Years of education: Not on file  . Highest education level: Not on file  Occupational History  . Not on file  Social Needs  . Financial resource strain: Not on file  . Food insecurity:    Worry: Not on file    Inability: Not on file  . Transportation needs:    Medical: Not on file    Non-medical: Not on file  Tobacco Use  . Smoking status: Never Smoker  . Smokeless tobacco: Never Used  Substance and Sexual Activity  .  Alcohol use: No  . Drug use: No  . Sexual activity: Yes    Birth control/protection: None  Lifestyle  . Physical activity:    Days per week: Not on file    Minutes per session: Not on file  . Stress: Not on file  Relationships  . Social connections:    Talks on phone: Not on file    Gets together: Not on file    Attends religious service: Not on file    Active member of club or organization: Not on file    Attends meetings of clubs or organizations: Not on file    Relationship status: Not on file  . Intimate partner violence:    Fear of current or ex partner: Not on file    Emotionally abused: Not on file    Physically abused: Not on file    Forced sexual activity: Not on file  Other Topics Concern  . Not on file  Social History Narrative   In school for criminal justice   Works full and part time job    Masters degree, Freight forwarder    No kids    Lives in [place]    No guns    Wears seat belt    Safe in relationship     Review of Systems  Constitutional: Negative.   HENT: Negative.   Eyes: Negative.   Respiratory: Negative.   Cardiovascular: Negative.   Gastrointestinal: Negative.   Genitourinary: Negative.   Musculoskeletal: Negative.   Skin: Negative.   Neurological: Negative.   Endo/Heme/Allergies: Negative.   Psychiatric/Behavioral: Negative.     PHYSICAL EXAMINATION:    BP 124/70 (BP Location: Right Arm, Patient Position: Sitting, Cuff Size: Normal)   Pulse 84   Resp 16   Wt 242 lb (109.8 kg)   LMP 04/28/2017   BMI 39.06 kg/m     General appearance: alert, cooperative and appears stated age  ASSESSMENT H/O severe menorrhagia and anemia, now under control on OCP's Severe dysmenorrhea, decreased frequency on OCP's, tolerable with the ibuprofen H/O severe anemia, now with normal Hgb!     PLAN Continue the loseasonique Ibuprofen as needed F/U for an annual exam in 3 months    An After Visit Summary was printed and given to the patient.  ~15  minutes face to face time of which over 50% was spent in counseling.

## 2017-07-12 ENCOUNTER — Ambulatory Visit
Admission: RE | Admit: 2017-07-12 | Discharge: 2017-07-12 | Disposition: A | Discharge status: Home / Self Care | Payer: 59 | Source: Ambulatory Visit | Attending: Internal Medicine | Admitting: Internal Medicine

## 2017-07-12 DIAGNOSIS — Z1231 Encounter for screening mammogram for malignant neoplasm of breast: Secondary | ICD-10-CM | POA: Diagnosis not present

## 2017-07-19 ENCOUNTER — Encounter: Payer: Self-pay | Admitting: Internal Medicine

## 2017-07-19 ENCOUNTER — Ambulatory Visit: Payer: 59 | Admitting: Internal Medicine

## 2017-07-19 VITALS — BP 140/80 | HR 84 | Temp 98.9°F | Ht 66.0 in | Wt 244.0 lb

## 2017-07-19 DIAGNOSIS — E669 Obesity, unspecified: Secondary | ICD-10-CM | POA: Diagnosis not present

## 2017-07-19 DIAGNOSIS — D72819 Decreased white blood cell count, unspecified: Secondary | ICD-10-CM

## 2017-07-19 DIAGNOSIS — Z23 Encounter for immunization: Secondary | ICD-10-CM

## 2017-07-19 LAB — CBC WITH DIFFERENTIAL/PLATELET
BASOS PCT: 0.4 % (ref 0.0–3.0)
Basophils Absolute: 0 10*3/uL (ref 0.0–0.1)
EOS ABS: 0 10*3/uL (ref 0.0–0.7)
Eosinophils Relative: 1.2 % (ref 0.0–5.0)
HCT: 42.8 % (ref 36.0–46.0)
Hemoglobin: 14.8 g/dL (ref 12.0–15.0)
Lymphocytes Relative: 38.2 % (ref 12.0–46.0)
Lymphs Abs: 1.6 10*3/uL (ref 0.7–4.0)
MCHC: 34.5 g/dL (ref 30.0–36.0)
MCV: 90.6 fl (ref 78.0–100.0)
MONO ABS: 0.4 10*3/uL (ref 0.1–1.0)
Monocytes Relative: 9.7 % (ref 3.0–12.0)
NEUTROS ABS: 2.1 10*3/uL (ref 1.4–7.7)
NEUTROS PCT: 50.5 % (ref 43.0–77.0)
PLATELETS: 307 10*3/uL (ref 150.0–400.0)
RBC: 4.72 Mil/uL (ref 3.87–5.11)
RDW: 13.1 % (ref 11.5–15.5)
WBC: 4.1 10*3/uL (ref 4.0–10.5)

## 2017-07-19 MED ORDER — PHENTERMINE HCL 37.5 MG PO TABS: 37.5000 mg | ORAL_TABLET | Freq: Every day | ORAL | 0 refills | Status: DC

## 2017-07-19 NOTE — Progress Notes (Signed)
Chief Complaint  Patient presents with  . Follow-up   F/u  1. Obesity BMI 39.38 agreeable to adipex will try to start working out now that is out  2. Agreeable to hep B vaccine today   Review of Systems  Constitutional: Negative for weight loss.  HENT: Negative for hearing loss.   Eyes: Negative for blurred vision.  Respiratory: Negative for shortness of breath.   Cardiovascular: Negative for chest pain.  Gastrointestinal: Negative for abdominal pain.  Skin: Negative for rash.  Neurological: Negative for headaches.  Psychiatric/Behavioral: Negative for depression.   Past Medical History:  Diagnosis Date  . Anemia   . Dysmenorrhea   . Fibroid   . History of blood transfusion 09/2016   [facility] - 2 units transfused  . Iron deficiency anemia    Past Surgical History:  Procedure Laterality Date  . DILATATION & CURETTAGE/HYSTEROSCOPY WITH MYOSURE N/A 11/13/2016   Procedure: DILATATION & CURETTAGE/HYSTEROSCOPY WITH MYOSURE;  Surgeon: Salvadore Dom, MD;  Location: [facility];  Service: Gynecology;  Laterality: N/A;  . DILATION AND CURETTAGE OF UTERUS    . OTHER SURGICAL HISTORY     cervical polyp removal    Family History  Problem Relation Age of Onset  . Diabetes Mother   . Hypertension Mother   . Arthritis Mother   . Asthma Mother   . COPD Father   . Breast cancer Maternal Grandmother        mat great gm   Social History   Socioeconomic History  . Marital status: Single    Spouse name: Not on file  . Number of children: Not on file  . Years of education: Not on file  . Highest education level: Not on file  Occupational History  . Not on file  Social Needs  . Financial resource strain: Not on file  . Food insecurity:    Worry: Not on file    Inability: Not on file  . Transportation needs:    Medical: Not on file    Non-medical: Not on file  Tobacco Use  . Smoking status: Never Smoker  . Smokeless tobacco: Never Used  Substance and Sexual Activity  . Alcohol  use: No  . Drug use: No  . Sexual activity: Yes    Birth control/protection: None  Lifestyle  . Physical activity:    Days per week: Not on file    Minutes per session: Not on file  . Stress: Not on file  Relationships  . Social connections:    Talks on phone: Not on file    Gets together: Not on file    Attends religious service: Not on file    Active member of club or organization: Not on file    Attends meetings of clubs or organizations: Not on file    Relationship status: Not on file  . Intimate partner violence:    Fear of current or ex partner: Not on file    Emotionally abused: Not on file    Physically abused: Not on file    Forced sexual activity: Not on file  Other Topics Concern  . Not on file  Social History Narrative   In school for criminal justice   Works full and part time job    Masters degree, Freight forwarder    No kids    Lives in [place]    No guns    Wears seat belt    Safe in relationship    Current Meds  Medication Sig  .  acetaminophen (TYLENOL) 650 MG CR tablet Take 650 mg by mouth every 8 (eight) hours as needed for pain or fever.  . Cholecalciferol 50000 units capsule Take 1 capsule (50,000 Units total) by mouth once a week.  . folic acid (FOLVITE) 1 MG tablet Take 1 tablet (1 mg total) by mouth daily.  Marland Kitchen ibuprofen (ADVIL,MOTRIN) 800 MG tablet Take 1 tablet (800 mg total) by mouth every 8 (eight) hours as needed.  . Levonorgestrel-Ethinyl Estradiol (LOSEASONIQUE) 0.1-0.02 & 0.01 MG tablet Take 1 tablet by mouth daily.  . Multiple Vitamin (MULTIVITAMIN WITH MINERALS) TABS tablet Take 1 tablet by mouth daily.   No Known Allergies Recent Results (from the past 2160 hour(s))  CBC w/Diff     Status: None   Collection Time: 05/03/17  1:10 PM  Result Value Ref Range   WBC Count 4.8 3.9 - 10.0 K/uL   RBC 4.72 3.70 - 5.32 MIL/uL   Hemoglobin 14.6 11.6 - 15.9 g/dL   HCT 40.6 34.8 - 46.6 %   MCV 86.0 81.0 - 101.0 fL   MCH 30.9 26.0 - 34.0 pg   MCHC 36.0  32.0 - 36.0 g/dL   RDW 13.5 11.1 - 15.7 %   Platelet Count 255 145 - 400 K/uL   Neutrophils Relative % 50 %   Neutro Abs 2.4 1.5 - 6.5 K/uL   Lymphocytes Relative 40 %   Lymphs Abs 1.9 0.9 - 3.3 K/uL   Monocytes Relative 9 %   Monocytes Absolute 0.4 0.1 - 0.9 K/uL   Eosinophils Relative 1 %   Eosinophils Absolute 0.1 0.0 - 0.5 K/uL   Basophils Relative 0 %   Basophils Absolute 0.0 0.0 - 0.1 K/uL    Comment: Performed at [facility] at [facility], [address] [place], [place] [address]  Reticulocytes     Status: None   Collection Time: 05/03/17  1:10 PM  Result Value Ref Range   Retic Ct Pct 1.3 0.7 - 2.1 %   RBC. 4.70 3.70 - 5.45 MIL/uL   Retic Count, Absolute 61.1 33.7 - 90.7 K/uL    Comment: Performed at [facility], [place] [address] [place], [place] [address]  Ferritin     Status: None   Collection Time: 05/03/17  1:11 PM  Result Value Ref Range   Ferritin 214 9 - 269 ng/mL    Comment: Performed at [facility], [place] [address] [place], [place] [address]  Iron and TIBC     Status: None   Collection Time: 05/03/17  1:11 PM  Result Value Ref Range   Iron 114 41 - 142 ug/dL   TIBC 313 236 - 444 ug/dL   Saturation Ratios 36 21 - 57 %   UIBC 199 ug/dL    Comment: Performed at [facility], [address] [place], [place] [address]  Hepatitis B surface antibody     Status: Abnormal   Collection Time: 06/21/17 11:34 AM  Result Value Ref Range   Hepatitis B-Post <5 (L) > OR = 10 mIU/mL    Comment: . Patient does not have immunity to hepatitis B virus. . For additional information, please refer to http://education.[facility].com/faq/FAQ105 (This link is being provided for informational/ educational purposes only).   Lipid panel     Status: Abnormal   Collection Time: 06/21/17 11:34 AM  Result Value Ref Range   Cholesterol 187 0 - 200 mg/dL    Comment:  ATP III  Classification       Desirable:  < 200 mg/dL               Borderline High:  200 - 239 mg/dL          High:  > = 240 mg/dL   Triglycerides 52.0 0.0 - 149.0 mg/dL    Comment: Normal:  <150 mg/dLBorderline High:  150 - 199 mg/dL   HDL 68.40 >39.00 mg/dL   VLDL 10.4 0.0 - 40.0 mg/dL   LDL Cholesterol 108 (H) 0 - 99 mg/dL   Total CHOL/HDL Ratio 3     Comment:                Men          Women1/2 Average Risk     3.4          3.3Average Risk          5.0          4.42X Average Risk          9.6          7.13X Average Risk          15.0          11.0                       NonHDL 118.15     Comment: NOTE:  Non-HDL goal should be 30 mg/dL higher than patient's LDL goal (i.e. LDL goal of < 70 mg/dL, would have non-HDL goal of < 100 mg/dL)  Vitamin D (25 hydroxy)     Status: Abnormal   Collection Time: 06/21/17 11:34 AM  Result Value Ref Range   VITD 7.03 (L) 30.00 - 100.00 ng/mL  TSH     Status: None   Collection Time: 06/21/17 11:34 AM  Result Value Ref Range   TSH 0.90 0.35 - 4.50 uIU/mL  T4, free     Status: None   Collection Time: 06/21/17 11:34 AM  Result Value Ref Range   Free T4 0.77 0.60 - 1.60 ng/dL    Comment: Specimens from patients who are undergoing biotin therapy and /or ingesting biotin supplements may contain high levels of biotin.  The higher biotin concentration in these specimens interferes with this Free T4 assay.  Specimens that contain high levels  of biotin may cause false high results for this Free T4 assay.  Please interpret results in light of the total clinical presentation of the patient.    CBC with Differential/Platelet     Status: Abnormal   Collection Time: 06/21/17 11:34 AM  Result Value Ref Range   WBC 3.9 (L) 4.0 - 10.5 K/uL   RBC 4.67 3.87 - 5.11 Mil/uL   Hemoglobin 14.8 12.0 - 15.0 g/dL   HCT 42.2 36.0 - 46.0 %   MCV 90.4 78.0 - 100.0 fl   MCHC 34.9 30.0 - 36.0 g/dL   RDW 13.4 11.5 - 15.5 %   Platelets 277.0 150.0 - 400.0 K/uL   Neutrophils Relative %  45.1 43.0 - 77.0 %   Lymphocytes Relative 43.7 12.0 - 46.0 %   Monocytes Relative 9.1 3.0 - 12.0 %   Eosinophils Relative 1.5 0.0 - 5.0 %   Basophils Relative 0.6 0.0 - 3.0 %   Neutro Abs 1.7 1.4 - 7.7 K/uL   Lymphs Abs 1.7 0.7 - 4.0 K/uL   Monocytes Absolute 0.4 0.1 - 1.0 K/uL   Eosinophils Absolute  0.1 0.0 - 0.7 K/uL   Basophils Absolute 0.0 0.0 - 0.1 K/uL  Comprehensive metabolic panel     Status: None   Collection Time: 06/21/17 11:34 AM  Result Value Ref Range   Sodium 137 135 - 145 mEq/L   Potassium 4.0 3.5 - 5.1 mEq/L   Chloride 106 96 - 112 mEq/L   CO2 23 19 - 32 mEq/L   Glucose, Bld 92 70 - 99 mg/dL   BUN 12 6 - 23 mg/dL   Creatinine, Ser 0.69 0.40 - 1.20 mg/dL   Total Bilirubin 0.4 0.2 - 1.2 mg/dL   Alkaline Phosphatase 66 39 - 117 U/L   AST 18 0 - 37 U/L   ALT 16 0 - 35 U/L   Total Protein 7.4 6.0 - 8.3 g/dL   Albumin 3.8 3.5 - 5.2 g/dL   Calcium 9.1 8.4 - 10.5 mg/dL   GFR 120.82 >60.00 mL/min  Hemoglobin A1c     Status: None   Collection Time: 06/21/17 11:34 AM  Result Value Ref Range   Hgb A1c MFr Bld 5.4 4.6 - 6.5 %    Comment: Glycemic Control Guidelines for People with Diabetes:Non Diabetic:  <6%Goal of Therapy: <7%Additional Action Suggested:  >8%    Objective  Body mass index is 39.38 kg/m. Wt Readings from Last 3 Encounters:  07/19/17 244 lb (110.7 kg)  07/05/17 242 lb (109.8 kg)  06/07/17 239 lb 9.6 oz (108.7 kg)   Temp Readings from Last 3 Encounters:  07/19/17 98.9 F (37.2 C) (Oral)  06/07/17 97.6 F (36.4 C) (Oral)  05/03/17 98.8 F (37.1 C) (Oral)   BP Readings from Last 3 Encounters:  07/19/17 140/80  07/05/17 124/70  06/07/17 134/78   Pulse Readings from Last 3 Encounters:  07/19/17 84  07/05/17 84  06/07/17 91    Physical Exam  Constitutional: She is oriented to person, place, and time. Vital signs are normal. She appears well-developed and well-nourished. She is cooperative.  HENT:  Head: Normocephalic and atraumatic.   Mouth/Throat: Oropharynx is clear and moist and mucous membranes are normal.  Eyes: Pupils are equal, round, and reactive to light. Conjunctivae are normal.  Cardiovascular: Normal rate, regular rhythm and normal heart sounds.  Pulmonary/Chest: Effort normal and breath sounds normal.  Neurological: She is alert and oriented to person, place, and time. Gait normal.  Skin: Skin is warm, dry and intact.  Psychiatric: She has a normal mood and affect. Her speech is normal and behavior is normal. Judgment and thought content normal. Cognition and memory are normal.  Nursing note and vitals reviewed.   Assessment   1. Obesity BMI 39.38  2. Leukopenia she is taking otc antihistamines which maybe cause  3. HM Plan   1.  Trial of adipex  F/u in 1 month  2. Repeat cBC today  3.  Declines flu shot  Tdap today  Hep B vaccine today Declines HIV check   LMP 03/20/17 has then q3 months with OCP. Pap neg OB/GYN 09/26/16 neg HPV Mammogram neg 07/12/17    Provider: Dr. Olivia Mackie McLean-Scocuzza-Internal Medicine

## 2017-07-19 NOTE — Progress Notes (Signed)
Pre visit review using our clinic review tool, if applicable. No additional management support is needed unless otherwise documented below in the visit note. 

## 2017-07-19 NOTE — Addendum Note (Signed)
Addended by: Orland Mustard on: 07/19/2017 01:31 PM   Modules accepted: Orders

## 2017-07-19 NOTE — Patient Instructions (Addendum)
F/u in 1 month   Phentermine tablets or capsules What is this medicine? PHENTERMINE (FEN ter meen) decreases your appetite. It is used with a reduced calorie diet and exercise to help you lose weight. This medicine may be used for other purposes; ask your health care provider or pharmacist if you have questions. COMMON BRAND NAME(S): Adipex-P, Atti-Plex P, Atti-Plex P Spansule, Fastin, Lomaira, Pro-Fast, Tara-8 What should I tell my health care provider before I take this medicine? They need to know if you have any of these conditions: -agitation -glaucoma -heart disease -high blood pressure -history of substance abuse -lung disease called Primary Pulmonary Hypertension (PPH) -taken an MAOI like Carbex, Eldepryl, Marplan, Nardil, or Parnate in last 14 days -thyroid disease -an unusual or allergic reaction to phentermine, other medicines, foods, dyes, or preservatives -pregnant or trying to get pregnant -breast-feeding How should I use this medicine? Take this medicine by mouth with a glass of water. Follow the directions on the prescription label. The instructions for use may differ based on the product and dose you are taking. Avoid taking this medicine in the evening. It may interfere with sleep. Take your doses at regular intervals. Do not take your medicine more often than directed. Talk to your pediatrician regarding the use of this medicine in children. While this drug may be prescribed for children 17 years or older for selected conditions, precautions do apply. Overdosage: If you think you have taken too much of this medicine contact a poison control center or emergency room at once. NOTE: This medicine is only for you. Do not share this medicine with others. What if I miss a dose? If you miss a dose, take it as soon as you can. If it is almost time for your next dose, take only that dose. Do not take double or extra doses. What may interact with this medicine? Do not take this  medicine with any of the following medications: -duloxetine -MAOIs like Carbex, Eldepryl, Marplan, Nardil, and Parnate -medicines for colds or breathing difficulties like pseudoephedrine or phenylephrine -procarbazine -sibutramine -SSRIs like citalopram, escitalopram, fluoxetine, fluvoxamine, paroxetine, and sertraline -stimulants like dexmethylphenidate, methylphenidate or modafinil -venlafaxine This medicine may also interact with the following medications: -medicines for diabetes This list may not describe all possible interactions. Give your health care provider a list of all the medicines, herbs, non-prescription drugs, or dietary supplements you use. Also tell them if you smoke, drink alcohol, or use illegal drugs. Some items may interact with your medicine. What should I watch for while using this medicine? Notify your physician immediately if you become short of breath while doing your normal activities. Do not take this medicine within 6 hours of bedtime. It can keep you from getting to sleep. Avoid drinks that contain caffeine and try to stick to a regular bedtime every night. This medicine was intended to be used in addition to a healthy diet and exercise. The best results are achieved this way. This medicine is only indicated for short-term use. Eventually your weight loss may level out. At that point, the drug will only help you maintain your new weight. Do not increase or in any way change your dose without consulting your doctor. You may get drowsy or dizzy. Do not drive, use machinery, or do anything that needs mental alertness until you know how this medicine affects you. Do not stand or sit up quickly, especially if you are an older patient. This reduces the risk of dizzy or fainting spells. Alcohol may  increase dizziness and drowsiness. Avoid alcoholic drinks. What side effects may I notice from receiving this medicine? Side effects that you should report to your doctor or health  care professional as soon as possible: -chest pain, palpitations -depression or severe changes in mood -increased blood pressure -irritability -nervousness or restlessness -severe dizziness -shortness of breath -problems urinating -unusual swelling of the legs -vomiting Side effects that usually do not require medical attention (report to your doctor or health care professional if they continue or are bothersome): -blurred vision or other eye problems -changes in sexual ability or desire -constipation or diarrhea -difficulty sleeping -dry mouth or unpleasant taste -headache -nausea This list may not describe all possible side effects. Call your doctor for medical advice about side effects. You may report side effects to FDA at 1-800-FDA-1088. Where should I keep my medicine? Keep out of the reach of children. This medicine can be abused. Keep your medicine in a safe place to protect it from theft. Do not share this medicine with anyone. Selling or giving away this medicine is dangerous and against the law. This medicine may cause accidental overdose and death if taken by other adults, children, or pets. Mix any unused medicine with a substance like cat litter or coffee grounds. Then throw the medicine away in a sealed container like a sealed bag or a coffee can with a lid. Do not use the medicine after the expiration date. Store at room temperature between 20 and 25 degrees C (68 and 77 degrees F). Keep container tightly closed. NOTE: This sheet is a summary. It may not cover all possible information. If you have questions about this medicine, talk to your doctor, pharmacist, or health care provider.  2018 Elsevier/Gold Standard (2014-12-18 12:53:15)   DTaP Vaccine (Diphtheria, Tetanus, and Pertussis): What You Need to Know 1. Why get vaccinated? Diphtheria, tetanus, and pertussis are serious diseases caused by bacteria. Diphtheria and pertussis are spread from person to person.  Tetanus enters the body through cuts or wounds. DIPHTHERIA causes a thick covering in the back of the throat.  It can lead to breathing problems, paralysis, heart failure, and even death.  TETANUS (Lockjaw) causes painful tightening of the muscles, usually all over the body.  It can lead to "locking" of the jaw so the victim cannot open his mouth or swallow. Tetanus leads to death in up to 2 out of 10 cases.  PERTUSSIS (Whooping Cough) causes coughing spells so bad that it is hard for infants to eat, drink, or breathe. These spells can last for weeks.  It can lead to pneumonia, seizures (jerking and staring spells), brain damage, and death.  Diphtheria, tetanus, and pertussis vaccine (DTaP) can help prevent these diseases. Most children who are vaccinated with DTaP will be protected throughout childhood. Many more children would get these diseases if we stopped vaccinating. DTaP is a safer version of an older vaccine called DTP. DTP is no longer used in the [place]. 2. Who should get DTaP vaccine and when? Children should get 5 doses of DTaP vaccine, one dose at each of the following ages:  2 months  4 months  6 months  15-18 months  4-6 years  DTaP may be given at the same time as other vaccines. 3. Some children should not get DTaP vaccine or should wait  Children with minor illnesses, such as a cold, may be vaccinated. But children who are moderately or severely ill should usually wait until they recover before getting DTaP vaccine.  Any child who had a life-threatening allergic reaction after a dose of DTaP should not get another dose.  Any child who suffered a brain or nervous system disease within 7 days after a dose of DTaP should not get another dose.  Talk with your doctor if your child: ? had a seizure or collapsed after a dose of DTaP, ? cried non-stop for 3 hours or more after a dose of DTaP, ? had a fever over 105F after a dose of DTaP. Ask your doctor for  more information. Some of these children should not get another dose of pertussis vaccine, but may get a vaccine without pertussis, called DT. 4. Older children and adults DTaP is not licensed for adolescents, adults, or children 54 years of age and older. But older people still need protection. A vaccine called Tdap is similar to DTaP. A single dose of Tdap is recommended for people 11 through 41 years of age. Another vaccine, called Td, protects against tetanus and diphtheria, but not pertussis. It is recommended every 10 years. There are separate Vaccine Information Statements for these vaccines. 5. What are the risks from DTaP vaccine? Getting diphtheria, tetanus, or pertussis disease is much riskier than getting DTaP vaccine. However, a vaccine, like any medicine, is capable of causing serious problems, such as severe allergic reactions. The risk of DTaP vaccine causing serious harm, or death, is extremely small. Mild problems (common)  Fever (up to about 1 child in 4)  Redness or swelling where the shot was given (up to about 1 child in 4)  Soreness or tenderness where the shot was given (up to about 1 child in 4) These problems occur more often after the 4th and 5th doses of the DTaP series than after earlier doses. Sometimes the 4th or 5th dose of DTaP vaccine is followed by swelling of the entire arm or leg in which the shot was given, lasting 1-7 days (up to about 1 child in 40). Other mild problems include:  Fussiness (up to about 1 child in 3)  Tiredness or poor appetite (up to about 1 child in 10)  Vomiting (up to about 1 child in 67) These problems generally occur 1-3 days after the shot. Moderate problems (uncommon)  Seizure (jerking or staring) (about 1 child out of 14,000)  Non-stop crying, for 3 hours or more (up to about 1 child out of 1,000)  High fever, over 105F (about 1 child out of 16,000) Severe problems (very rare)  Serious allergic reaction (less than 1 out  of a million doses)  Several other severe problems have been reported after DTaP vaccine. These include: ? Long-term seizures, coma, or lowered consciousness ? Permanent brain damage. These are so rare it is hard to tell if they are caused by the vaccine. Controlling fever is especially important for children who have had seizures, for any reason. It is also important if another family member has had seizures. You can reduce fever and pain by giving your child an aspirin-free pain reliever when the shot is given, and for the next 24 hours, following the package instructions. 6. What if there is a serious reaction? What should I look for? Look for anything that concerns you, such as signs of a severe allergic reaction, very high fever, or behavior changes. Signs of a severe allergic reaction can include hives, swelling of the face and throat, difficulty breathing, a fast heartbeat, dizziness, and weakness. These would start a few minutes to a few hours after  the vaccination. What should I do?  If you think it is a severe allergic reaction or other emergency that can't wait, call 9-1-1 or get the person to the nearest hospital. Otherwise, call your doctor.  Afterward, the reaction should be reported to the Vaccine Adverse Event Reporting System (VAERS). Your doctor might file this report, or you can do it yourself through the VAERS web site at www.vaers.SamedayNews.es, or by calling 845-777-4300. ? VAERS is only for reporting reactions. They do not give medical advice. 7. The National Vaccine Injury Compensation Program The [employer] Vaccine Injury Compensation Program (VICP) is a federal program that was created to compensate people who may have been injured by certain vaccines. Persons who believe they may have been injured by a vaccine can learn about the program and about filing a claim by calling (514)231-6826 or visiting the [facility] website at GoldCloset.com.ee. 8. How can I learn  more?  Ask your doctor.  Call your local or state health department.  Contact the Centers for Disease Control and Prevention (CDC): ? Call (985)171-7722 (1-800-CDC-INFO) or ? Visit CDC's website at http://hunter.com/ CDC DTaP Vaccine (Diphtheria, Tetanus, and Pertussis) VIS (08/10/05) This information is not intended to replace advice given to you by your health care provider. Make sure you discuss any questions you have with your health care provider. Document Released: 01/08/2006 Document Revised: 12/02/2015 Document Reviewed: 12/02/2015 Elsevier Interactive Patient Education  2017 [place].  Hepatitis B Vaccine, Recombinant injection What is this medicine? HEPATITIS B VACCINE (hep uh TAHY tis B VAK seen) is a vaccine. It is used to prevent an infection with the hepatitis B virus. This medicine may be used for other purposes; ask your health care provider or pharmacist if you have questions. COMMON BRAND NAME(S): Engerix-B, Recombivax HB What should I tell my health care provider before I take this medicine? They need to know if you have any of these conditions: -fever, infection -heart disease -hepatitis B infection -immune system problems -kidney disease -an unusual or allergic reaction to vaccines, yeast, other medicines, foods, dyes, or preservatives -pregnant or trying to get pregnant -breast-feeding How should I use this medicine? This vaccine is for injection into a muscle. It is given by a health care professional. A copy of Vaccine Information Statements will be given before each vaccination. Read this sheet carefully each time. The sheet may change frequently. Talk to your pediatrician regarding the use of this medicine in children. While this drug may be prescribed for children as young as newborn for selected conditions, precautions do apply. Overdosage: If you think you have taken too much of this medicine contact a poison control center or emergency room at  once. NOTE: This medicine is only for you. Do not share this medicine with others. What if I miss a dose? It is important not to miss your dose. Call your doctor or health care professional if you are unable to keep an appointment. What may interact with this medicine? -medicines that suppress your immune function like adalimumab, anakinra, infliximab -medicines to treat cancer -steroid medicines like prednisone or cortisone This list may not describe all possible interactions. Give your health care provider a list of all the medicines, herbs, non-prescription drugs, or dietary supplements you use. Also tell them if you smoke, drink alcohol, or use illegal drugs. Some items may interact with your medicine. What should I watch for while using this medicine? See your health care provider for all shots of this vaccine as directed. You must have  3 shots of this vaccine for protection from hepatitis B infection. Tell your doctor right away if you have any serious or unusual side effects after getting this vaccine. What side effects may I notice from receiving this medicine? Side effects that you should report to your doctor or health care professional as soon as possible: -allergic reactions like skin rash, itching or hives, swelling of the face, lips, or tongue -breathing problems -confused, irritated -fast, irregular heartbeat -flu-like syndrome -numb, tingling pain -seizures -unusually weak or tired Side effects that usually do not require medical attention (report to your doctor or health care professional if they continue or are bothersome): -diarrhea -fever -headache -loss of appetite -muscle pain -nausea -pain, redness, swelling, or irritation at site where injected -tiredness This list may not describe all possible side effects. Call your doctor for medical advice about side effects. You may report side effects to FDA at 1-800-FDA-1088. Where should I keep my medicine? This drug is  given in a hospital or clinic and will not be stored at home. NOTE: This sheet is a summary. It may not cover all possible information. If you have questions about this medicine, talk to your doctor, pharmacist, or health care provider.  2018 Elsevier/Gold Standard (2013-07-14 13:26:01)

## 2017-07-23 ENCOUNTER — Telehealth: Payer: Self-pay | Admitting: Internal Medicine

## 2017-07-23 ENCOUNTER — Other Ambulatory Visit: Payer: Self-pay | Admitting: Internal Medicine

## 2017-07-23 DIAGNOSIS — R609 Edema, unspecified: Secondary | ICD-10-CM

## 2017-07-23 MED ORDER — FUROSEMIDE 20 MG PO TABS: 20.0000 mg | ORAL_TABLET | Freq: Every day | ORAL | 0 refills | Status: DC | PRN

## 2017-07-23 NOTE — Telephone Encounter (Signed)
I will send in prn Lasix 20 mg to take on as needed   [name]

## 2017-07-23 NOTE — Telephone Encounter (Signed)
Please advise 

## 2017-07-23 NOTE — Telephone Encounter (Signed)
Please advise. Is patient referring to phentermine?

## 2017-07-23 NOTE — Telephone Encounter (Signed)
Copied from [place] [id number]. Topic: Inquiry >> Jul 23, 2017  9:58 AM Lennox Solders wrote: Reason for CRM:pt was seen on 07-19-17 and md offer to send rx for  fluid retention medication in for the patient. The patient changed her mind and now  would like rx sent to [facility] [address]

## 2017-07-24 NOTE — Telephone Encounter (Signed)
Patient has picked up medication 

## 2017-08-02 ENCOUNTER — Inpatient Hospital Stay: Payer: 59

## 2017-08-02 ENCOUNTER — Other Ambulatory Visit: Payer: Self-pay | Admitting: Family

## 2017-08-02 ENCOUNTER — Inpatient Hospital Stay: Payer: 59 | Attending: Family | Admitting: Family

## 2017-08-02 VITALS — BP 134/67 | HR 74 | Resp 18 | Wt 243.2 lb

## 2017-08-02 DIAGNOSIS — N920 Excessive and frequent menstruation with regular cycle: Secondary | ICD-10-CM

## 2017-08-02 DIAGNOSIS — D500 Iron deficiency anemia secondary to blood loss (chronic): Secondary | ICD-10-CM | POA: Diagnosis not present

## 2017-08-02 DIAGNOSIS — D560 Alpha thalassemia: Secondary | ICD-10-CM

## 2017-08-02 LAB — CBC WITH DIFFERENTIAL (CANCER CENTER ONLY)
BASOS ABS: 0 10*3/uL (ref 0.0–0.1)
Basophils Relative: 1 %
EOS ABS: 0.1 10*3/uL (ref 0.0–0.5)
EOS PCT: 2 %
HCT: 40.3 % (ref 34.8–46.6)
Hemoglobin: 14.7 g/dL (ref 11.6–15.9)
LYMPHS PCT: 36 %
Lymphs Abs: 1.4 10*3/uL (ref 0.9–3.3)
MCH: 31.9 pg (ref 26.0–34.0)
MCHC: 36.5 g/dL — ABNORMAL HIGH (ref 32.0–36.0)
MCV: 87.4 fL (ref 81.0–101.0)
MONO ABS: 0.3 10*3/uL (ref 0.1–0.9)
Monocytes Relative: 9 %
Neutro Abs: 2 10*3/uL (ref 1.5–6.5)
Neutrophils Relative %: 52 %
PLATELETS: 249 10*3/uL (ref 145–400)
RBC: 4.61 MIL/uL (ref 3.70–5.32)
RDW: 12.1 % (ref 11.1–15.7)
WBC Count: 3.8 10*3/uL — ABNORMAL LOW (ref 3.9–10.0)

## 2017-08-02 LAB — RETICULOCYTES
RBC.: 4.62 MIL/uL (ref 3.70–5.45)
RETIC COUNT ABSOLUTE: 83.2 10*3/uL (ref 33.7–90.7)
RETIC CT PCT: 1.8 % (ref 0.7–2.1)

## 2017-08-02 MED ORDER — FOLIC ACID 1 MG PO TABS: 1.0000 mg | ORAL_TABLET | Freq: Every day | ORAL | 3 refills | Status: DC

## 2017-08-02 NOTE — Progress Notes (Signed)
Hematology and Oncology Follow Up Visit  Stacy Miles [id number] [birth year] 42 y.o. 08/02/2017   Principle Diagnosis:  Iron deficiency anemia secondary to menorrhagia  Current Therapy:   IV iron as indicated - last received inJanuary2019   Interim History:  Stacy Miles is here today for follow-up. She is doing well and denies fatigued at this time.  She is on an oral contraceptive and only has her cycle every 3-4 months. Her cycles have been light.  No there bleeding, no bruising or petechiae. No lymphadenopathy noted on exam.  She is taking her folic acid daily as prescribed.  No fever, chill, n/v, cough, rash, dizziness, SOB, chest pain, palpitations, abdominal pain or changes in bowel or bladder habits.  No swelling, tenderness, numbness or tingling in her extremities. No c/o pain.  She has a good appetite and is staying well hydrated. Her weight is stable.   ECOG Performance Status: 0 - Asymptomatic  Medications:  Allergies as of 08/02/2017   No Known Allergies     Medication List        Accurate as of 08/02/17 10:42 AM. Always use your most recent med list.          acetaminophen 650 MG CR tablet Commonly known as:  TYLENOL Take 650 mg by mouth every 8 (eight) hours as needed for pain or fever.   Cholecalciferol 50000 units capsule Take 1 capsule (50,000 Units total) by mouth once a week.   folic acid 1 MG tablet Commonly known as:  FOLVITE Take 1 tablet (1 mg total) by mouth daily.   furosemide 20 MG tablet Commonly known as:  LASIX Take 1 tablet (20 mg total) by mouth daily as needed.   ibuprofen 800 MG tablet Commonly known as:  ADVIL,MOTRIN Take 1 tablet (800 mg total) by mouth every 8 (eight) hours as needed.   Levonorgestrel-Ethinyl Estradiol 0.1-0.02 & 0.01 MG tablet Commonly known as:  LOSEASONIQUE Take 1 tablet by mouth daily.   multivitamin with minerals Tabs tablet Take 1 tablet by mouth daily.   phentermine 37.5 MG tablet Commonly known as:   ADIPEX-P Take 1 tablet (37.5 mg total) by mouth daily before breakfast.       Allergies: No Known Allergies  Past Medical History, Surgical history, Social history, and Family History were reviewed and updated.  Review of Systems: All other 10 point review of systems is negative.   Physical Exam:  vitals were not taken for this visit.   Wt Readings from Last 3 Encounters:  07/19/17 244 lb (110.7 kg)  07/05/17 242 lb (109.8 kg)  06/07/17 239 lb 9.6 oz (108.7 kg)    Ocular: Sclerae unicteric, pupils equal, round and reactive to light Ear-nose-throat: Oropharynx clear, dentition fair Lymphatic: No cervical, supraclavicular or axillary adenopathy Lungs no rales or rhonchi, good excursion bilaterally Heart regular rate and rhythm, no murmur appreciated Abd soft, nontender, positive bowel sounds, no liver or spleen tip palpated on exam, no fluid wave  MSK no focal spinal tenderness, no joint edema Neuro: non-focal, well-oriented, appropriate affect Breasts: Deferred   Lab Results  Component Value Date   WBC 3.8 (L) 08/02/2017   HGB 14.7 08/02/2017   HCT 40.3 08/02/2017   MCV 87.4 08/02/2017   PLT 249 08/02/2017   Lab Results  Component Value Date   FERRITIN 214 05/03/2017   IRON 114 05/03/2017   TIBC 313 05/03/2017   UIBC 199 05/03/2017   IRONPCTSAT 36 05/03/2017   Lab Results  Component Value Date  RETICCTPCT 1.3 05/03/2017   RBC 4.61 08/02/2017   No results found for: KPAFRELGTCHN, LAMBDASER, KAPLAMBRATIO No results found for: IGGSERUM, IGA, IGMSERUM No results found for: Odetta Pink, SPEI   Chemistry      Component Value Date/Time   NA 137 06/21/2017 1134   NA 135 09/29/2016 0820   K 4.0 06/21/2017 1134   K 3.6 09/29/2016 0820   CL 106 06/21/2017 1134   CL 109 (H) 09/29/2016 0820   CO2 23 06/21/2017 1134   CO2 21 09/29/2016 0820   BUN 12 06/21/2017 1134   BUN 7 09/29/2016 0820   CREATININE 0.69  06/21/2017 1134   CREATININE 0.5 (L) 09/29/2016 0820      Component Value Date/Time   CALCIUM 9.1 06/21/2017 1134   CALCIUM 8.7 09/29/2016 0820   ALKPHOS 66 06/21/2017 1134   ALKPHOS 82 09/29/2016 0820   AST 18 06/21/2017 1134   AST 22 09/29/2016 0820   ALT 16 06/21/2017 1134   ALT 23 09/29/2016 0820   BILITOT 0.4 06/21/2017 1134   BILITOT 0.70 09/29/2016 0820      Impression and Plan: Stacy Miles is a very pleasant 42 yo African American female with iron deficiency anemia secondary to heavy cycles. She is now on an oral contraceptive that has reduced her to a light cycle every 3-4 months.  Her iron studies have been stable. We will see how they look today and bring her back in for infusion if needed.  We will plan to see her back in another 4 months for follow-up.  She will contact our office with any questions or concerns. We can certainly see her sooner if need be.   Laverna Peace, NP 5/9/201910:42 AM

## 2017-08-03 LAB — IRON AND TIBC
IRON: 89 ug/dL (ref 41–142)
Saturation Ratios: 26 % (ref 21–57)
TIBC: 341 ug/dL (ref 236–444)
UIBC: 252 ug/dL

## 2017-08-03 LAB — FERRITIN: Ferritin: 124 ng/mL (ref 9–269)

## 2017-08-15 ENCOUNTER — Other Ambulatory Visit: Payer: Self-pay | Admitting: Internal Medicine

## 2017-08-15 DIAGNOSIS — R609 Edema, unspecified: Secondary | ICD-10-CM

## 2017-08-15 MED ORDER — FUROSEMIDE 20 MG PO TABS: 20.0000 mg | ORAL_TABLET | Freq: Every day | ORAL | 0 refills | Status: DC | PRN

## 2017-08-23 ENCOUNTER — Encounter: Payer: Self-pay | Admitting: Internal Medicine

## 2017-08-23 ENCOUNTER — Ambulatory Visit: Payer: 59 | Admitting: Internal Medicine

## 2017-08-23 VITALS — BP 134/84 | HR 77 | Temp 98.4°F | Ht 66.0 in | Wt 235.8 lb

## 2017-08-23 DIAGNOSIS — D72819 Decreased white blood cell count, unspecified: Secondary | ICD-10-CM | POA: Diagnosis not present

## 2017-08-23 DIAGNOSIS — E669 Obesity, unspecified: Secondary | ICD-10-CM | POA: Diagnosis not present

## 2017-08-23 DIAGNOSIS — Z23 Encounter for immunization: Secondary | ICD-10-CM | POA: Diagnosis not present

## 2017-08-23 DIAGNOSIS — E559 Vitamin D deficiency, unspecified: Secondary | ICD-10-CM

## 2017-08-23 MED ORDER — PHENTERMINE HCL 37.5 MG PO TABS: 37.5000 mg | ORAL_TABLET | Freq: Every day | ORAL | 0 refills | Status: DC

## 2017-08-23 NOTE — Patient Instructions (Addendum)
zevia 0 calorie healthy soda  Keep up the good work    Exercising to [employer] Exercising can help you to lose weight. In order to lose weight through exercise, you need to do vigorous-intensity exercise. You can tell that you are exercising with vigorous intensity if you are breathing very hard and fast and cannot hold a conversation while exercising. Moderate-intensity exercise helps to maintain your current weight. You can tell that you are exercising at a moderate level if you have a higher heart rate and faster breathing, but you are still able to hold a conversation. How often should I exercise? Choose an activity that you enjoy and set realistic goals. Your health care provider can help you to make an activity plan that works for you. Exercise regularly as directed by your health care provider. This may include:  Doing resistance training twice each week, such as: ? Push-ups. ? Sit-ups. ? Lifting weights. ? Using resistance bands.  Doing a given intensity of exercise for a given amount of time. Choose from these options: ? 150 minutes of moderate-intensity exercise every week. ? 75 minutes of vigorous-intensity exercise every week. ? A mix of moderate-intensity and vigorous-intensity exercise every week.  Children, pregnant women, people who are out of shape, people who are overweight, and older adults may need to consult a health care provider for individual recommendations. If you have any sort of medical condition, be sure to consult your health care provider before starting a new exercise program. What are some activities that can help me to lose weight?  Walking at a rate of at least 4.5 miles an hour.  Jogging or running at a rate of 5 miles per hour.  Biking at a rate of at least 10 miles per hour.  Lap swimming.  Roller-skating or in-line skating.  Cross-country skiing.  Vigorous competitive sports, such as football, basketball, and soccer.  Jumping  rope.  Aerobic dancing. How can I be more active in my day-to-day activities?  Use the stairs instead of the elevator.  Take a walk during your lunch break.  If you drive, park your car farther away from work or school.  If you take public transportation, get off one stop early and walk the rest of the way.  Make all of your phone calls while standing up and walking around.  Get up, stretch, and walk around every 30 minutes throughout the day. What guidelines should I follow while exercising?  Do not exercise so much that you hurt yourself, feel dizzy, or get very short of breath.  Consult your health care provider prior to starting a new exercise program.  Wear comfortable clothes and shoes with good support.  Drink plenty of water while you exercise to prevent dehydration or heat stroke. Body water is lost during exercise and must be replaced.  Work out until you breathe faster and your heart beats faster. This information is not intended to replace advice given to you by your health care provider. Make sure you discuss any questions you have with your health care provider. Document Released: 04/15/2010 Document Revised: 08/19/2015 Document Reviewed: 08/14/2013 Elsevier Interactive Patient Education  [employer].

## 2017-08-23 NOTE — Progress Notes (Signed)
Chief Complaint  Patient presents with  . Follow-up   F/u  1. Obesity with wt loss 8-9 lbs goal is <200 by 11/2017 she is calorie counting 2000 daily and exercising and healthy diet choices  2. Edema is improved with prn lasix  3. Hep B vaccine dose due today and again 01/18/18    Review of Systems  Constitutional: Positive for weight loss.  HENT: Negative for hearing loss.   Eyes: Negative for blurred vision.  Respiratory: Negative for shortness of breath.   Cardiovascular: Negative for leg swelling.  Musculoskeletal: Negative for back pain.  Skin: Negative for rash.  Neurological: Negative for headaches.  Psychiatric/Behavioral: Negative for depression.   Past Medical History:  Diagnosis Date  . Anemia   . Dysmenorrhea   . Fibroid   . History of blood transfusion 09/2016   [facility] - 2 units transfused  . Iron deficiency anemia    Past Surgical History:  Procedure Laterality Date  . DILATATION & CURETTAGE/HYSTEROSCOPY WITH MYOSURE N/A 11/13/2016   Procedure: DILATATION & CURETTAGE/HYSTEROSCOPY WITH MYOSURE;  Surgeon: Salvadore Dom, MD;  Location: [facility];  Service: Gynecology;  Laterality: N/A;  . DILATION AND CURETTAGE OF UTERUS    . OTHER SURGICAL HISTORY     cervical polyp removal    Family History  Problem Relation Age of Onset  . Diabetes Mother   . Hypertension Mother   . Arthritis Mother   . Asthma Mother   . COPD Father   . Breast cancer Maternal Grandmother        mat great gm   Social History   Socioeconomic History  . Marital status: Single    Spouse name: Not on file  . Number of children: Not on file  . Years of education: Not on file  . Highest education level: Not on file  Occupational History  . Not on file  Social Needs  . Financial resource strain: Not on file  . Food insecurity:    Worry: Not on file    Inability: Not on file  . Transportation needs:    Medical: Not on file    Non-medical: Not on file  Tobacco Use  . Smoking  status: Never Smoker  . Smokeless tobacco: Never Used  Substance and Sexual Activity  . Alcohol use: No  . Drug use: No  . Sexual activity: Yes    Birth control/protection: None  Lifestyle  . Physical activity:    Days per week: Not on file    Minutes per session: Not on file  . Stress: Not on file  Relationships  . Social connections:    Talks on phone: Not on file    Gets together: Not on file    Attends religious service: Not on file    Active member of club or organization: Not on file    Attends meetings of clubs or organizations: Not on file    Relationship status: Not on file  . Intimate partner violence:    Fear of current or ex partner: Not on file    Emotionally abused: Not on file    Physically abused: Not on file    Forced sexual activity: Not on file  Other Topics Concern  . Not on file  Social History Narrative   In school for criminal justice   Works full and part time job    Masters degree, Freight forwarder    No kids    Lives in [place]    No guns  Wears seat belt    Safe in relationship    Current Meds  Medication Sig  . acetaminophen (TYLENOL) 650 MG CR tablet Take 650 mg by mouth every 8 (eight) hours as needed for pain or fever.  . Cholecalciferol 50000 units capsule Take 1 capsule (50,000 Units total) by mouth once a week.  . folic acid (FOLVITE) 1 MG tablet Take 1 tablet (1 mg total) by mouth daily.  . furosemide (LASIX) 20 MG tablet Take 1 tablet (20 mg total) by mouth daily as needed.  Marland Kitchen ibuprofen (ADVIL,MOTRIN) 800 MG tablet Take 1 tablet (800 mg total) by mouth every 8 (eight) hours as needed.  . Levonorgestrel-Ethinyl Estradiol (LOSEASONIQUE) 0.1-0.02 & 0.01 MG tablet Take 1 tablet by mouth daily.  . Multiple Vitamin (MULTIVITAMIN WITH MINERALS) TABS tablet Take 1 tablet by mouth daily.  . phentermine (ADIPEX-P) 37.5 MG tablet Take 1 tablet (37.5 mg total) by mouth daily before breakfast.   No Known Allergies Recent Results (from the past 2160  hour(s))  Hepatitis B surface antibody     Status: Abnormal   Collection Time: 06/21/17 11:34 AM  Result Value Ref Range   Hepatitis B-Post <5 (L) > OR = 10 mIU/mL    Comment: . Patient does not have immunity to hepatitis B virus. . For additional information, please refer to http://education.questdiagnostics.com/faq/FAQ105 (This link is being provided for informational/ educational purposes only).   Lipid panel     Status: Abnormal   Collection Time: 06/21/17 11:34 AM  Result Value Ref Range   Cholesterol 187 0 - 200 mg/dL    Comment: ATP III Classification       Desirable:  < 200 mg/dL               Borderline High:  200 - 239 mg/dL          High:  > = 240 mg/dL   Triglycerides 52.0 0.0 - 149.0 mg/dL    Comment: Normal:  <150 mg/dLBorderline High:  150 - 199 mg/dL   HDL 68.40 >39.00 mg/dL   VLDL 10.4 0.0 - 40.0 mg/dL   LDL Cholesterol 108 (H) 0 - 99 mg/dL   Total CHOL/HDL Ratio 3     Comment:                Men          Women1/2 Average Risk     3.4          3.3Average Risk          5.0          4.42X Average Risk          9.6          7.13X Average Risk          15.0          11.0                       NonHDL 118.15     Comment: NOTE:  Non-HDL goal should be 30 mg/dL higher than patient's LDL goal (i.e. LDL goal of < 70 mg/dL, would have non-HDL goal of < 100 mg/dL)  Vitamin D (25 hydroxy)     Status: Abnormal   Collection Time: 06/21/17 11:34 AM  Result Value Ref Range   VITD 7.03 (L) 30.00 - 100.00 ng/mL  TSH     Status: None   Collection Time: 06/21/17 11:34 AM  Result Value Ref Range  TSH 0.90 0.35 - 4.50 uIU/mL  T4, free     Status: None   Collection Time: 06/21/17 11:34 AM  Result Value Ref Range   Free T4 0.77 0.60 - 1.60 ng/dL    Comment: Specimens from patients who are undergoing biotin therapy and /or ingesting biotin supplements may contain high levels of biotin.  The higher biotin concentration in these specimens interferes with this Free T4 assay.  Specimens  that contain high levels  of biotin may cause false high results for this Free T4 assay.  Please interpret results in light of the total clinical presentation of the patient.    CBC with Differential/Platelet     Status: Abnormal   Collection Time: 06/21/17 11:34 AM  Result Value Ref Range   WBC 3.9 (L) 4.0 - 10.5 K/uL   RBC 4.67 3.87 - 5.11 Mil/uL   Hemoglobin 14.8 12.0 - 15.0 g/dL   HCT 42.2 36.0 - 46.0 %   MCV 90.4 78.0 - 100.0 fl   MCHC 34.9 30.0 - 36.0 g/dL   RDW 13.4 11.5 - 15.5 %   Platelets 277.0 150.0 - 400.0 K/uL   Neutrophils Relative % 45.1 43.0 - 77.0 %   Lymphocytes Relative 43.7 12.0 - 46.0 %   Monocytes Relative 9.1 3.0 - 12.0 %   Eosinophils Relative 1.5 0.0 - 5.0 %   Basophils Relative 0.6 0.0 - 3.0 %   Neutro Abs 1.7 1.4 - 7.7 K/uL   Lymphs Abs 1.7 0.7 - 4.0 K/uL   Monocytes Absolute 0.4 0.1 - 1.0 K/uL   Eosinophils Absolute 0.1 0.0 - 0.7 K/uL   Basophils Absolute 0.0 0.0 - 0.1 K/uL  Comprehensive metabolic panel     Status: None   Collection Time: 06/21/17 11:34 AM  Result Value Ref Range   Sodium 137 135 - 145 mEq/L   Potassium 4.0 3.5 - 5.1 mEq/L   Chloride 106 96 - 112 mEq/L   CO2 23 19 - 32 mEq/L   Glucose, Bld 92 70 - 99 mg/dL   BUN 12 6 - 23 mg/dL   Creatinine, Ser 0.69 0.40 - 1.20 mg/dL   Total Bilirubin 0.4 0.2 - 1.2 mg/dL   Alkaline Phosphatase 66 39 - 117 U/L   AST 18 0 - 37 U/L   ALT 16 0 - 35 U/L   Total Protein 7.4 6.0 - 8.3 g/dL   Albumin 3.8 3.5 - 5.2 g/dL   Calcium 9.1 8.4 - 10.5 mg/dL   GFR 120.82 >60.00 mL/min  Hemoglobin A1c     Status: None   Collection Time: 06/21/17 11:34 AM  Result Value Ref Range   Hgb A1c MFr Bld 5.4 4.6 - 6.5 %    Comment: Glycemic Control Guidelines for People with Diabetes:Non Diabetic:  <6%Goal of Therapy: <7%Additional Action Suggested:  >8%   CBC with Differential/Platelet     Status: None   Collection Time: 07/19/17  1:44 PM  Result Value Ref Range   WBC 4.1 4.0 - 10.5 K/uL   RBC 4.72 3.87 - 5.11  Mil/uL   Hemoglobin 14.8 12.0 - 15.0 g/dL   HCT 42.8 36.0 - 46.0 %   MCV 90.6 78.0 - 100.0 fl   MCHC 34.5 30.0 - 36.0 g/dL   RDW 13.1 11.5 - 15.5 %   Platelets 307.0 150.0 - 400.0 K/uL   Neutrophils Relative % 50.5 43.0 - 77.0 %   Lymphocytes Relative 38.2 12.0 - 46.0 %   Monocytes Relative 9.7 3.0 -  12.0 %   Eosinophils Relative 1.2 0.0 - 5.0 %   Basophils Relative 0.4 0.0 - 3.0 %   Neutro Abs 2.1 1.4 - 7.7 K/uL   Lymphs Abs 1.6 0.7 - 4.0 K/uL   Monocytes Absolute 0.4 0.1 - 1.0 K/uL   Eosinophils Absolute 0.0 0.0 - 0.7 K/uL   Basophils Absolute 0.0 0.0 - 0.1 K/uL  Reticulocytes     Status: None   Collection Time: 08/02/17 10:23 AM  Result Value Ref Range   Retic Ct Pct 1.8 0.7 - 2.1 %   RBC. 4.62 3.70 - 5.45 MIL/uL   Retic Count, Absolute 83.2 33.7 - 90.7 K/uL    Comment: Performed at [facility], [place] [address] [place], [place] [address]  CBC with Differential ([place] Only)     Status: Abnormal   Collection Time: 08/02/17 10:23 AM  Result Value Ref Range   WBC Count 3.8 (L) 3.9 - 10.0 K/uL   RBC 4.61 3.70 - 5.32 MIL/uL   Hemoglobin 14.7 11.6 - 15.9 g/dL   HCT 40.3 34.8 - 46.6 %   MCV 87.4 81.0 - 101.0 fL   MCH 31.9 26.0 - 34.0 pg   MCHC 36.5 (H) 32.0 - 36.0 g/dL   RDW 12.1 11.1 - 15.7 %   Platelet Count 249 145 - 400 K/uL   Neutrophils Relative % 52 %   Neutro Abs 2.0 1.5 - 6.5 K/uL   Lymphocytes Relative 36 %   Lymphs Abs 1.4 0.9 - 3.3 K/uL   Monocytes Relative 9 %   Monocytes Absolute 0.3 0.1 - 0.9 K/uL   Eosinophils Relative 2 %   Eosinophils Absolute 0.1 0.0 - 0.5 K/uL   Basophils Relative 1 %   Basophils Absolute 0.0 0.0 - 0.1 K/uL    Comment: Performed at [facility] at [facility], [address] [place], [place] [address]  Ferritin     Status: None   Collection Time: 08/02/17 10:23 AM  Result Value Ref Range   Ferritin 124 9 - 269 ng/mL    Comment: Performed at [facility], [place] [address] [place], [place] [address]  Iron and TIBC     Status: None   Collection Time: 08/02/17 10:23 AM  Result Value Ref Range   Iron 89 41 - 142 ug/dL   TIBC 341 236 - 444 ug/dL   Saturation Ratios 26 21 - 57 %   UIBC 252 ug/dL    Comment: Performed at [facility], [place] [address], [place], [place] [address]   Objective  Body mass index is 38.06 kg/m. Wt Readings from Last 3 Encounters:  08/23/17 235 lb 12.8 oz (107 kg)  08/02/17 243 lb 4 oz (110.3 kg)  07/19/17 244 lb (110.7 kg)   Temp Readings from Last 3 Encounters:  08/23/17 98.4 F (36.9 C) (Oral)  07/19/17 98.9 F (37.2 C) (Oral)  06/07/17 97.6 F (36.4 C) (Oral)   BP Readings from Last 3 Encounters:  08/23/17 134/84  08/02/17 134/67  07/19/17 140/80   Pulse Readings from Last 3 Encounters:  08/23/17 77  08/02/17 74  07/19/17 84    Physical Exam  Constitutional: She is oriented to person, place, and time. Vital signs are normal. She appears well-developed and well-nourished. She is cooperative.  HENT:  Head: Normocephalic and atraumatic.  Mouth/Throat: Oropharynx is clear and moist and mucous membranes are normal.  Eyes: Pupils are equal,  round, and reactive to light. Conjunctivae are normal.  Cardiovascular: Normal rate, regular rhythm and normal heart sounds.  Pulmonary/Chest: Effort normal and breath sounds normal.  Neurological: She is alert and oriented to person, place, and time. Gait normal.  Skin: Skin is warm, dry and intact.  Psychiatric: She has a normal mood and affect. Her speech is normal and behavior is normal. Judgment and thought content normal. Cognition and memory are normal.  Nursing note and vitals reviewed.   Assessment   1. Obesity BMI 38.06 with 8-9 lbs wt loss  2. Edema improved  3. HM 4. Leukopenia  Plan   1. Refilled adipex  Cont healthy diet choices and exercise  2. Prn lasix  3.  Declines flu shot  Tdap had  Hep B vaccine  today 2/3 last due 01/18/18  Declines HIV check   LMP 03/20/17 has then q3 months with OCP.  Pap neg OB/GYN 09/26/16 neg HPV Mammogram neg 07/12/17  4. Check CBC next visit      Provider: Dr. Olivia Mackie McLean-[facility]ocuz[facility]

## 2017-08-23 NOTE — Progress Notes (Signed)
Pre visit review using our clinic review tool, if applicable. No additional management support is needed unless otherwise documented below in the visit note. 

## 2017-09-21 ENCOUNTER — Other Ambulatory Visit: Payer: Self-pay | Admitting: Internal Medicine

## 2017-09-21 DIAGNOSIS — R609 Edema, unspecified: Secondary | ICD-10-CM

## 2017-09-21 MED ORDER — FUROSEMIDE 20 MG PO TABS: 20.0000 mg | ORAL_TABLET | Freq: Every day | ORAL | 2 refills | Status: DC | PRN

## 2017-10-03 NOTE — Progress Notes (Signed)
42 y.o. G0P0000 SingleAfrican AmericanF here for annual exam.  The patient has a h/o a fibroid uterus (not deviating her cavity) dysmenorrhea, menorrhagia and severe anemia. She underwent a hysteroscopy, polypectomy, D&C in 8/18. She has been on [place] since December. She has had lots of spotting since starting the pill. She had her first "real cycle" on 6/24 x 3 days. It was heavy and cramping. Currently not bleeding or cramping. She was previously on the one month pill, prefers this one.  Not sexually active since last year.  Period Cycle (Days): 90 Period Duration (Days): 3  Menstrual Flow: Heavy Menstrual Control: Tampon, Maxi pad Menstrual Control Change Freq (Hours): every 1-2 hours  Dysmenorrhea: (!) Severe Dysmenorrhea Symptoms: Cramping  She c/o a one month h/o intermittent vaginal itching, no abnormal d/c.   Patient's last menstrual period was 09/17/2017.          Sexually active: No.  The current method of family planning is OCP (estrogen/progesterone).    Exercising: No.  The patient does not participate in regular exercise at present. Smoker:  no  Health Maintenance: Pap: 09/21/16 WNL neg HPV, 10/2011 per patient normal [facility] History of abnormal Pap:  no MMG:  07/12/17 BI-RADS CATEGORY  1: Negative. Colonoscopy:  n/a BMD:   n/a TDaP:  09/2015 Gardasil: n/a, counseled    reports that she has never smoked. She has never used smokeless tobacco. She reports that she does not drink alcohol or use drugs. Manager at [employer].   Past Medical History:  Diagnosis Date  . Anemia   . Dysmenorrhea   . Fibroid   . History of blood transfusion 09/2016   [facility] - 2 units transfused  . Iron deficiency anemia     Past Surgical History:  Procedure Laterality Date  . DILATATION & CURETTAGE/HYSTEROSCOPY WITH MYOSURE N/A 11/13/2016   Procedure: DILATATION & CURETTAGE/HYSTEROSCOPY WITH MYOSURE;  Surgeon: Salvadore Dom, MD;  Location: [facility];  Service:  Gynecology;  Laterality: N/A;  . DILATION AND CURETTAGE OF UTERUS    . OTHER SURGICAL HISTORY     cervical polyp removal     Current Outpatient Medications  Medication Sig Dispense Refill  . acetaminophen (TYLENOL) 650 MG CR tablet Take 650 mg by mouth every 8 (eight) hours as needed for pain or fever.    . Cholecalciferol 50000 units capsule Take 1 capsule (50,000 Units total) by mouth once a week. 13 capsule 1  . folic acid (FOLVITE) 1 MG tablet Take 1 tablet (1 mg total) by mouth daily. 90 tablet 3  . furosemide (LASIX) 20 MG tablet Take 1 tablet (20 mg total) by mouth daily as needed. 30 tablet 2  . ibuprofen (ADVIL,MOTRIN) 800 MG tablet Take 1 tablet (800 mg total) by mouth every 8 (eight) hours as needed. 30 tablet 1  . Levonorgestrel-Ethinyl Estradiol (LOSEASONIQUE) 0.1-0.02 & 0.01 MG tablet Take 1 tablet by mouth daily. 1 Package 4  . Multiple Vitamin (MULTIVITAMIN WITH MINERALS) TABS tablet Take 1 tablet by mouth daily.    . phentermine (ADIPEX-P) 37.5 MG tablet Take 1 tablet (37.5 mg total) by mouth daily before breakfast. 31 tablet 0   No current facility-administered medications for this visit.   Lasix was for bloating. No edema. Only takes it as needed.   Family History  Problem Relation Age of Onset  . Diabetes Mother   . Hypertension Mother   . Arthritis Mother   . Asthma Mother   . COPD Father   .  Breast cancer Maternal Grandmother        mat great gm    Review of Systems  Constitutional: Negative.   HENT: Negative.   Eyes: Negative.   Respiratory: Negative.   Cardiovascular: Negative.   Gastrointestinal: Negative.   Endocrine: Negative.   Genitourinary: Negative.   Musculoskeletal: Negative.   Skin: Negative.   Allergic/Immunologic: Negative.   Neurological: Negative.   Hematological: Negative.   Psychiatric/Behavioral: Negative.     Exam:   BP 132/68 (BP Location: Right Arm, Patient Position: Sitting, Cuff Size: Large)   Pulse 86   Resp 16   Ht 5'  6.75" (1.695 m)   Wt 226 lb 4 oz (102.6 kg)   LMP 09/17/2017   BMI 35.70 kg/m   Weight change: @WEIGHTCHANGE @ Height:   Height: 5' 6.75" (169.5 cm)  Ht Readings from Last 3 Encounters:  10/04/17 5' 6.75" (1.695 m)  10/04/17 5\' 6"  (1.676 m)  08/23/17 5\' 6"  (1.676 m)    General appearance: alert, cooperative and appears stated age Head: Normocephalic, without obvious abnormality, atraumatic Neck: no adenopathy, supple, symmetrical, trachea midline and thyroid normal to inspection and palpation Lungs: clear to auscultation bilaterally Cardiovascular: regular rate and rhythm Breasts: normal appearance, no masses or tenderness Abdomen: soft, non-tender; non distended,  no masses,  no organomegaly Extremities: extremities normal, atraumatic, no cyanosis or edema Skin: Skin color, texture, turgor normal. No rashes or lesions Lymph nodes: Cervical, supraclavicular, and axillary nodes normal. No abnormal inguinal nodes palpated Neurologic: Grossly normal   Pelvic: External genitalia:  no lesions              Urethra:  normal appearing urethra with no masses, tenderness or lesions              Bartholins and Skenes: normal                 Vagina: normal appearing vagina with a slight increase in watery, white vaginal d/c               Cervix: no lesions               Bimanual Exam:  Uterus:  anteverted, mobile, not tender, 10-12 week sized              Adnexa: no mass, fullness, tenderness               Rectovaginal: Confirms               Anus:  normal sphincter tone, no lesions  Chaperone was present for exam.  A:  Well Woman with normal exam  H/O menorrhagia and severe anemia. Better on OCP's, last HgB in 3/19 was normal  Dysmenorrhea   Vulvitis, intermittent. Increase in vaginal d/c on exam (looks like BV)  P:   No pap this year  Mammogram UTD  Labs with primary and hematology  Continue OCP's  Ibuprofen prn cramps  Discussed breast self exam  Discussed calcium and vit D  intake  Affirm sent, treatment depending on results

## 2017-10-04 ENCOUNTER — Ambulatory Visit: Payer: No Typology Code available for payment source | Admitting: Obstetrics and Gynecology

## 2017-10-04 ENCOUNTER — Other Ambulatory Visit: Payer: Self-pay

## 2017-10-04 ENCOUNTER — Encounter: Payer: Self-pay | Admitting: Internal Medicine

## 2017-10-04 ENCOUNTER — Ambulatory Visit: Payer: 59 | Admitting: Internal Medicine

## 2017-10-04 ENCOUNTER — Encounter: Payer: Self-pay | Admitting: Obstetrics and Gynecology

## 2017-10-04 VITALS — BP 132/68 | HR 86 | Resp 16 | Ht 66.75 in | Wt 226.2 lb

## 2017-10-04 VITALS — BP 130/80 | HR 81 | Temp 98.0°F | Ht 66.0 in | Wt 225.0 lb

## 2017-10-04 DIAGNOSIS — Z01419 Encounter for gynecological examination (general) (routine) without abnormal findings: Secondary | ICD-10-CM | POA: Diagnosis not present

## 2017-10-04 DIAGNOSIS — E669 Obesity, unspecified: Secondary | ICD-10-CM

## 2017-10-04 DIAGNOSIS — D72819 Decreased white blood cell count, unspecified: Secondary | ICD-10-CM

## 2017-10-04 DIAGNOSIS — R600 Localized edema: Secondary | ICD-10-CM | POA: Diagnosis not present

## 2017-10-04 DIAGNOSIS — N760 Acute vaginitis: Secondary | ICD-10-CM | POA: Diagnosis not present

## 2017-10-04 MED ORDER — IBUPROFEN 800 MG PO TABS: 800.0000 mg | ORAL_TABLET | Freq: Three times a day (TID) | ORAL | 1 refills | Status: DC | PRN

## 2017-10-04 MED ORDER — PHENTERMINE HCL 37.5 MG PO TABS: 37.5000 mg | ORAL_TABLET | Freq: Every day | ORAL | 0 refills | Status: DC

## 2017-10-04 MED ORDER — LEVONORGEST-ETH ESTRAD 91-DAY 0.1-0.02 & 0.01 MG PO TABS: 1.0000 | ORAL_TABLET | Freq: Every day | ORAL | 4 refills | Status: DC

## 2017-10-04 NOTE — Progress Notes (Signed)
Chief Complaint  Patient presents with  . Follow-up   F/u  1. Obesity losing weight with diet change and adipex  2. Leg edema improved with lasix taking 3x per week   Review of Systems  Constitutional: Positive for weight loss.  HENT: Negative for hearing loss.   Eyes: Negative for blurred vision.  Respiratory: Negative for shortness of breath.   Cardiovascular: Negative for leg swelling.  Skin: Negative for rash.   Past Medical History:  Diagnosis Date  . Anemia   . Dysmenorrhea   . Fibroid   . History of blood transfusion 09/2016   [facility] - 2 units transfused  . Iron deficiency anemia    Past Surgical History:  Procedure Laterality Date  . DILATATION & CURETTAGE/HYSTEROSCOPY WITH MYOSURE N/A 11/13/2016   Procedure: DILATATION & CURETTAGE/HYSTEROSCOPY WITH MYOSURE;  Surgeon: Salvadore Dom, MD;  Location: [facility];  Service: Gynecology;  Laterality: N/A;  . DILATION AND CURETTAGE OF UTERUS    . OTHER SURGICAL HISTORY     cervical polyp removal    Family History  Problem Relation Age of Onset  . Diabetes Mother   . Hypertension Mother   . Arthritis Mother   . Asthma Mother   . COPD Father   . Breast cancer Maternal Grandmother        mat great gm   Social History   Socioeconomic History  . Marital status: Single    Spouse name: Not on file  . Number of children: Not on file  . Years of education: Not on file  . Highest education level: Not on file  Occupational History  . Not on file  Social Needs  . Financial resource strain: Not on file  . Food insecurity:    Worry: Not on file    Inability: Not on file  . Transportation needs:    Medical: Not on file    Non-medical: Not on file  Tobacco Use  . Smoking status: Never Smoker  . Smokeless tobacco: Never Used  Substance and Sexual Activity  . Alcohol use: No  . Drug use: No  . Sexual activity: Yes    Birth control/protection: None  Lifestyle  . Physical activity:    Days per week: Not on file     Minutes per session: Not on file  . Stress: Not on file  Relationships  . Social connections:    Talks on phone: Not on file    Gets together: Not on file    Attends religious service: Not on file    Active member of club or organization: Not on file    Attends meetings of clubs or organizations: Not on file    Relationship status: Not on file  . Intimate partner violence:    Fear of current or ex partner: Not on file    Emotionally abused: Not on file    Physically abused: Not on file    Forced sexual activity: Not on file  Other Topics Concern  . Not on file  Social History Narrative   In school for criminal justice   Works full and part time job    Masters degree, Freight forwarder    No kids    Lives in [place]    No guns    Wears seat belt    Safe in relationship    Current Meds  Medication Sig  . acetaminophen (TYLENOL) 650 MG CR tablet Take 650 mg by mouth every 8 (eight) hours as needed for pain  or fever.  . Cholecalciferol 50000 units capsule Take 1 capsule (50,000 Units total) by mouth once a week.  . folic acid (FOLVITE) 1 MG tablet Take 1 tablet (1 mg total) by mouth daily.  . furosemide (LASIX) 20 MG tablet Take 1 tablet (20 mg total) by mouth daily as needed.  Marland Kitchen ibuprofen (ADVIL,MOTRIN) 800 MG tablet Take 1 tablet (800 mg total) by mouth every 8 (eight) hours as needed.  . Levonorgestrel-Ethinyl Estradiol (LOSEASONIQUE) 0.1-0.02 & 0.01 MG tablet Take 1 tablet by mouth daily.  . Multiple Vitamin (MULTIVITAMIN WITH MINERALS) TABS tablet Take 1 tablet by mouth daily.  . phentermine (ADIPEX-P) 37.5 MG tablet Take 1 tablet (37.5 mg total) by mouth daily before breakfast.   No Known Allergies Recent Results (from the past 2160 hour(s))  CBC with Differential/Platelet     Status: None   Collection Time: 07/19/17  1:44 PM  Result Value Ref Range   WBC 4.1 4.0 - 10.5 K/uL   RBC 4.72 3.87 - 5.11 Mil/uL   Hemoglobin 14.8 12.0 - 15.0 g/dL   HCT 42.8 36.0 - 46.0 %   MCV 90.6  78.0 - 100.0 fl   MCHC 34.5 30.0 - 36.0 g/dL   RDW 13.1 11.5 - 15.5 %   Platelets 307.0 150.0 - 400.0 K/uL   Neutrophils Relative % 50.5 43.0 - 77.0 %   Lymphocytes Relative 38.2 12.0 - 46.0 %   Monocytes Relative 9.7 3.0 - 12.0 %   Eosinophils Relative 1.2 0.0 - 5.0 %   Basophils Relative 0.4 0.0 - 3.0 %   Neutro Abs 2.1 1.4 - 7.7 K/uL   Lymphs Abs 1.6 0.7 - 4.0 K/uL   Monocytes Absolute 0.4 0.1 - 1.0 K/uL   Eosinophils Absolute 0.0 0.0 - 0.7 K/uL   Basophils Absolute 0.0 0.0 - 0.1 K/uL  Reticulocytes     Status: None   Collection Time: 08/02/17 10:23 AM  Result Value Ref Range   Retic Ct Pct 1.8 0.7 - 2.1 %   RBC. 4.62 3.70 - 5.45 MIL/uL   Retic Count, Absolute 83.2 33.7 - 90.7 K/uL    Comment: Performed at [facility], [place] [address] [place], [place] [address]  CBC with Differential ([place] Only)     Status: Abnormal   Collection Time: 08/02/17 10:23 AM  Result Value Ref Range   WBC Count 3.8 (L) 3.9 - 10.0 K/uL   RBC 4.61 3.70 - 5.32 MIL/uL   Hemoglobin 14.7 11.6 - 15.9 g/dL   HCT 40.3 34.8 - 46.6 %   MCV 87.4 81.0 - 101.0 fL   MCH 31.9 26.0 - 34.0 pg   MCHC 36.5 (H) 32.0 - 36.0 g/dL   RDW 12.1 11.1 - 15.7 %   Platelet Count 249 145 - 400 K/uL   Neutrophils Relative % 52 %   Neutro Abs 2.0 1.5 - 6.5 K/uL   Lymphocytes Relative 36 %   Lymphs Abs 1.4 0.9 - 3.3 K/uL   Monocytes Relative 9 %   Monocytes Absolute 0.3 0.1 - 0.9 K/uL   Eosinophils Relative 2 %   Eosinophils Absolute 0.1 0.0 - 0.5 K/uL   Basophils Relative 1 %   Basophils Absolute 0.0 0.0 - 0.1 K/uL    Comment: Performed at [facility], [address] [place], [place] [address]  Ferritin     Status: None   Collection Time: 08/02/17 10:23 AM  Result Value Ref  Range   Ferritin 124 9 - 269 ng/mL    Comment: Performed at [facility], [place] [address], [place], [place] [address]  Iron and TIBC     Status: None    Collection Time: 08/02/17 10:23 AM  Result Value Ref Range   Iron 89 41 - 142 ug/dL   TIBC 341 236 - 444 ug/dL   Saturation Ratios 26 21 - 57 %   UIBC 252 ug/dL    Comment: Performed at [facility], [place] [address], [place], [place] [address]   Objective  Body mass index is 36.32 kg/m. Wt Readings from Last 3 Encounters:  10/04/17 225 lb (102.1 kg)  08/23/17 235 lb 12.8 oz (107 kg)  08/02/17 243 lb 4 oz (110.3 kg)   Temp Readings from Last 3 Encounters:  10/04/17 98 F (36.7 C) (Oral)  08/23/17 98.4 F (36.9 C) (Oral)  07/19/17 98.9 F (37.2 C) (Oral)   BP Readings from Last 3 Encounters:  10/04/17 130/80  08/23/17 134/84  08/02/17 134/67   Pulse Readings from Last 3 Encounters:  10/04/17 81  08/23/17 77  08/02/17 74    Physical Exam  Constitutional: She is oriented to person, place, and time. Vital signs are normal. She appears well-developed and well-nourished. She is cooperative.  HENT:  Head: Normocephalic and atraumatic.  Mouth/Throat: Oropharynx is clear and moist and mucous membranes are normal.  Eyes: Pupils are equal, round, and reactive to light. Conjunctivae are normal.  Cardiovascular: Normal rate, regular rhythm and normal heart sounds.  Pulmonary/Chest: Effort normal and breath sounds normal.  Neurological: She is alert and oriented to person, place, and time. Gait normal.  Skin: Skin is warm, dry and intact.  Psychiatric: She has a normal mood and affect. Her speech is normal and behavior is normal. Judgment and thought content normal. Cognition and memory are normal.  Nursing note and vitals reviewed.   Assessment   1. Obesity with weight loss BMI >36  2. Leg edema improved  3. Leukopenia  4. HM Plan  1. adipex x 2 more months f/u in 4 months  Congratulated  2. Cont lasix prn  3. Repeat cbc in future feeling well likely benign etiology  4.  Declines flu shot  Tdap utd Hep B vaccine 2/3 last shot due at f/u   Declines HIV check   LMP 03/20/17 has then q3 months with OCP. Pap neg OB/GYN 09/26/16 neg HPV Mammogram neg 07/12/17  Vit D def rec take 5000 IU qd starting end 11/2017    Provider: Dr. Olivia Mackie McLean-Scocuzza-Internal Medicine

## 2017-10-04 NOTE — Patient Instructions (Addendum)
Check with your insurance on coverage of Gardasil   EXERCISE AND DIET:  We recommended that you start or continue a regular exercise program for good health. Regular exercise means any activity that makes your heart beat faster and makes you sweat.  We recommend exercising at least 30 minutes per day at least 3 days a week, preferably 4 or 5.  We also recommend a diet low in fat and sugar.  Inactivity, poor dietary choices and obesity can cause diabetes, heart attack, stroke, and kidney damage, among others.    ALCOHOL AND SMOKING:  Women should limit their alcohol intake to no more than 7 drinks/beers/glasses of wine (combined, not each!) per week. Moderation of alcohol intake to this level decreases your risk of breast cancer and liver damage. And of course, no recreational drugs are part of a healthy lifestyle.  And absolutely no smoking or even second hand smoke. Most people know smoking can cause heart and lung diseases, but did you know it also contributes to weakening of your bones? Aging of your skin?  Yellowing of your teeth and nails?  CALCIUM AND VITAMIN D:  Adequate intake of calcium and Vitamin D are recommended.  The recommendations for exact amounts of these supplements seem to change often, but generally speaking 600 mg of calcium (either carbonate or citrate) and 800 units of Vitamin D per day seems prudent. Certain women may benefit from higher intake of Vitamin D.  If you are among these women, your doctor will have told you during your visit.    PAP SMEARS:  Pap smears, to check for cervical cancer or precancers,  have traditionally been done yearly, although recent scientific advances have shown that most women can have pap smears less often.  However, every woman still should have a physical exam from her gynecologist every year. It will include a breast check, inspection of the vulva and vagina to check for abnormal growths or skin changes, a visual exam of the cervix, and then an exam  to evaluate the size and shape of the uterus and ovaries.  And after 41 years of age, a rectal exam is indicated to check for rectal cancers. We will also provide age appropriate advice regarding health maintenance, like when you should have certain vaccines, screening for sexually transmitted diseases, bone density testing, colonoscopy, mammograms, etc.   MAMMOGRAMS:  All women over 82 years old should have a yearly mammogram. Many facilities now offer a "3D" mammogram, which may cost around $50 extra out of pocket. If possible,  we recommend you accept the option to have the 3D mammogram performed.  It both reduces the number of women who will be called back for extra views which then turn out to be normal, and it is better than the routine mammogram at detecting truly abnormal areas.    COLONOSCOPY:  Colonoscopy to screen for colon cancer is recommended for all women at age 46.  We know, you hate the idea of the prep.  We agree, BUT, having colon cancer and not knowing it is worse!!  Colon cancer so often starts as a polyp that can be seen and removed at colonscopy, which can quite literally save your life!  And if your first colonoscopy is normal and you have no family history of colon cancer, most women don't have to have it again for 10 years.  Once every ten years, you can do something that may end up saving your life, right?  We will be happy  to help you get it scheduled when you are ready.  Be sure to check your insurance coverage so you understand how much it will cost.  It may be covered as a preventative service at no cost, but you should check your particular policy.      Breast Self-Awareness Breast self-awareness means being familiar with how your breasts look and feel. It involves checking your breasts regularly and reporting any changes to your health care provider. Practicing breast self-awareness is important. A change in your breasts can be a sign of a serious medical problem. Being  familiar with how your breasts look and feel allows you to find any problems early, when treatment is more likely to be successful. All women should practice breast self-awareness, including women who have had breast implants. How to do a breast self-exam One way to learn what is normal for your breasts and whether your breasts are changing is to do a breast self-exam. To do a breast self-exam: Look for Changes  1. Remove all the clothing above your waist. 2. Stand in front of a mirror in a room with good lighting. 3. Put your hands on your hips. 4. Push your hands firmly downward. 5. Compare your breasts in the mirror. Look for differences between them (asymmetry), such as: ? Differences in shape. ? Differences in size. ? Puckers, dips, and bumps in one breast and not the other. 6. Look at each breast for changes in your skin, such as: ? Redness. ? Scaly areas. 7. Look for changes in your nipples, such as: ? Discharge. ? Bleeding. ? Dimpling. ? Redness. ? A change in position. Feel for Changes  Carefully feel your breasts for lumps and changes. It is best to do this while lying on your back on the floor and again while sitting or standing in the shower or tub with soapy water on your skin. Feel each breast in the following way:  Place the arm on the side of the breast you are examining above your head.  Feel your breast with the other hand.  Start in the nipple area and make  inch (2 cm) overlapping circles to feel your breast. Use the pads of your three middle fingers to do this. Apply light pressure, then medium pressure, then firm pressure. The light pressure will allow you to feel the tissue closest to the skin. The medium pressure will allow you to feel the tissue that is a little deeper. The firm pressure will allow you to feel the tissue close to the ribs.  Continue the overlapping circles, moving downward over the breast until you feel your ribs below your breast.  Move  one finger-width toward the center of the body. Continue to use the  inch (2 cm) overlapping circles to feel your breast as you move slowly up toward your collarbone.  Continue the up and down exam using all three pressures until you reach your armpit.  Write Down What You Find  Write down what is normal for each breast and any changes that you find. Keep a written record with breast changes or normal findings for each breast. By writing this information down, you do not need to depend only on memory for size, tenderness, or location. Write down where you are in your menstrual cycle, if you are still menstruating. If you are having trouble noticing differences in your breasts, do not get discouraged. With time you will become more familiar with the variations in your breasts and more comfortable  with the exam. How often should I examine my breasts? Examine your breasts every month. If you are breastfeeding, the best time to examine your breasts is after a feeding or after using a breast pump. If you menstruate, the best time to examine your breasts is 5-7 days after your period is over. During your period, your breasts are lumpier, and it may be more difficult to notice changes. When should I see my health care provider? See your health care provider if you notice:  A change in shape or size of your breasts or nipples.  A change in the skin of your breast or nipples, such as a reddened or scaly area.  Unusual discharge from your nipples.  A lump or thick area that was not there before.  Pain in your breasts.  Anything that concerns you.  This information is not intended to replace advice given to you by your health care provider. Make sure you discuss any questions you have with your health care provider. Document Released: 03/13/2005 Document Revised: 08/19/2015 Document Reviewed: 01/31/2015 Elsevier Interactive Patient Education  [employer].

## 2017-10-04 NOTE — Progress Notes (Signed)
Pre visit review using our clinic review tool, if applicable. No additional management support is needed unless otherwise documented below in the visit note. 

## 2017-10-04 NOTE — Patient Instructions (Addendum)
After 11/2017 take 5000 IU D3 over the counter  2 more months of weight loss medication  Follow up in 4 months sooner if needed     Mediterranean Diet A Mediterranean diet refers to food and lifestyle choices that are based on the traditions of countries located on the [employer]. This way of eating has been shown to help prevent certain conditions and improve outcomes for people who have chronic diseases, like kidney disease and heart disease. What are tips for following this plan? Lifestyle  Cook and eat meals together with your family, when possible.  Drink enough fluid to keep your urine clear or pale yellow.  Be physically active every day. This includes: ? Aerobic exercise like running or swimming. ? Leisure activities like gardening, walking, or housework.  Get 7-8 hours of sleep each night.  If recommended by your health care provider, drink red wine in moderation. This means 1 glass a day for nonpregnant women and 2 glasses a day for men. A glass of wine equals 5 oz (150 mL). Reading food labels  Check the serving size of packaged foods. For foods such as rice and pasta, the serving size refers to the amount of cooked product, not dry.  Check the total fat in packaged foods. Avoid foods that have saturated fat or trans fats.  Check the ingredients list for added sugars, such as corn syrup. Shopping  At the grocery store, buy most of your food from the areas near the walls of the store. This includes: ? Fresh fruits and vegetables (produce). ? Grains, beans, nuts, and seeds. Some of these may be available in unpackaged forms or large amounts (in bulk). ? Fresh seafood. ? Poultry and eggs. ? Low-fat dairy products.  Buy whole ingredients instead of prepackaged foods.  Buy fresh fruits and vegetables in-season from local farmers markets.  Buy frozen fruits and vegetables in resealable bags.  If you do not have access to quality fresh seafood, buy precooked  frozen shrimp or canned fish, such as tuna, salmon, or sardines.  Buy small amounts of raw or cooked vegetables, salads, or olives from the deli or salad bar at your store.  Stock your pantry so you always have certain foods on hand, such as olive oil, canned tuna, canned tomatoes, rice, pasta, and beans. Cooking  Cook foods with extra-virgin olive oil instead of using butter or other vegetable oils.  Have meat as a side dish, and have vegetables or grains as your main dish. This means having meat in small portions or adding small amounts of meat to foods like pasta or stew.  Use beans or vegetables instead of meat in common dishes like chili or lasagna.  Experiment with different cooking methods. Try roasting or broiling vegetables instead of steaming or sauteing them.  Add frozen vegetables to soups, stews, pasta, or rice.  Add nuts or seeds for added healthy fat at each meal. You can add these to yogurt, salads, or vegetable dishes.  Marinate fish or vegetables using olive oil, lemon juice, garlic, and fresh herbs. Meal planning  Plan to eat 1 vegetarian meal one day each week. Try to work up to 2 vegetarian meals, if possible.  Eat seafood 2 or more times a week.  Have healthy snacks readily available, such as: ? Vegetable sticks with hummus. ? [place] yogurt. ? Fruit and nut trail mix.  Eat balanced meals throughout the week. This includes: ? Fruit: 2-3 servings a day ? Vegetables: 4-5 servings a  day ? Low-fat dairy: 2 servings a day ? Fish, poultry, or lean meat: 1 serving a day ? Beans and legumes: 2 or more servings a week ? Nuts and seeds: 1-2 servings a day ? Whole grains: 6-8 servings a day ? Extra-virgin olive oil: 3-4 servings a day  Limit red meat and sweets to only a few servings a month What are my food choices?  Mediterranean diet ? Recommended ? Grains: Whole-grain pasta. Brown rice. Bulgar wheat. Polenta. Couscous. Whole-wheat bread. [place]. ? Vegetables: Artichokes. Beets. Broccoli. Cabbage. Carrots. Eggplant. Green beans. Chard. Kale. Spinach. Onions. Leeks. Peas. Squash. Tomatoes. Peppers. Radishes. ? Fruits: Apples. Apricots. Avocado. Berries. Bananas. Cherries. Dates. Figs. Grapes. Lemons. Melon. Oranges. Peaches. Plums. Pomegranate. ? Meats and other protein foods: Beans. Almonds. Sunflower seeds. Pine nuts. Peanuts. [place]. Salmon. Scallops. Shrimp. [place]. Tilapia. Clams. Oysters. Eggs. ? Dairy: Low-fat milk. Cheese. Greek yogurt. ? Beverages: Water. Red wine. Herbal tea. ? Fats and oils: Extra virgin olive oil. Avocado oil. Grape seed oil. ? Sweets and desserts: [place] yogurt with honey. Baked apples. Poached pears. Trail mix. ? Seasoning and other foods: Basil. Cilantro. Coriander. Cumin. Mint. Parsley. Sage. Rosemary. Tarragon. Garlic. Oregano. Thyme. Pepper. Balsalmic vinegar. Tahini. Hummus. Tomato sauce. Olives. Mushrooms. ? Limit these ? Grains: Prepackaged pasta or rice dishes. Prepackaged cereal with added sugar. ? Vegetables: Deep fried potatoes (french fries). ? Fruits: Fruit canned in syrup. ? Meats and other protein foods: Beef. Pork. Lamb. Poultry with skin. Hot dogs. Berniece Salines. ? Dairy: Ice cream. Sour cream. Whole milk. ? Beverages: Juice. Sugar-sweetened soft drinks. Beer. Liquor and spirits. ? Fats and oils: Butter. Canola oil. Vegetable oil. Beef fat (tallow). Lard. ? Sweets and desserts: Cookies. Cakes. Pies. Candy. ? Seasoning and other foods: Mayonnaise. Premade sauces and marinades. ? The items listed may not be a complete list. Talk with your dietitian about what dietary choices are right for you. Summary  The Mediterranean diet includes both food and lifestyle choices.  Eat a variety of fresh fruits and vegetables, beans, nuts, seeds, and whole grains.  Limit the amount of red meat and sweets that you eat.  Talk with your health care provider about whether it is safe for you to drink red wine in  moderation. This means 1 glass a day for nonpregnant women and 2 glasses a day for men. A glass of wine equals 5 oz (150 mL). This information is not intended to replace advice given to you by your health care provider. Make sure you discuss any questions you have with your health care provider. Document Released: 11/04/2015 Document Revised: 12/07/2015 Document Reviewed: 11/04/2015 Elsevier Interactive Patient Education  [employer].

## 2017-10-05 ENCOUNTER — Telehealth: Payer: Self-pay | Admitting: *Deleted

## 2017-10-05 LAB — VAGINITIS/VAGINOSIS, DNA PROBE
CANDIDA SPECIES: NEGATIVE
GARDNERELLA VAGINALIS: POSITIVE — AB
Trichomonas vaginosis: NEGATIVE

## 2017-10-05 NOTE — Telephone Encounter (Signed)
Notes recorded by Burnice Logan, RN on 10/05/2017 at 4:45 PM EDT Left message to call Sharee Pimple at [phone].

## 2017-10-05 NOTE — Telephone Encounter (Signed)
-----   Message from Salvadore Dom, MD sent at 10/05/2017  1:15 PM EDT ----- Please inform the patient that her vaginitis probe was + for BV and treat with flagyl (either oral or vaginal, her choice), no ETOH while on Flagyl.  Oral: Flagyl 500 mg BID x 7 days, or Vaginal: Metrogel, 1 applicator per vagina q day x 5 days.

## 2017-10-08 MED ORDER — METRONIDAZOLE 500 MG PO TABS: 500.0000 mg | ORAL_TABLET | Freq: Two times a day (BID) | ORAL | 0 refills | Status: DC

## 2017-10-08 NOTE — Telephone Encounter (Signed)
Spoke with patient, advised as seen below per Dr. Talbert Nan. Rx for flagyl po to verified pharmacy. ETOH precautions reviewed. Patient verbalizes understanding and is agreeable. Encounter closed.

## 2017-12-06 ENCOUNTER — Ambulatory Visit: Payer: 59 | Admitting: Family

## 2017-12-06 ENCOUNTER — Other Ambulatory Visit: Payer: 59

## 2017-12-10 ENCOUNTER — Other Ambulatory Visit: Payer: Self-pay | Admitting: Internal Medicine

## 2017-12-10 DIAGNOSIS — E559 Vitamin D deficiency, unspecified: Secondary | ICD-10-CM

## 2017-12-10 MED ORDER — CHOLECALCIFEROL 1.25 MG (50000 UT) PO CAPS: 50000.0000 [IU] | ORAL_CAPSULE | ORAL | 1 refills | Status: DC

## 2017-12-13 ENCOUNTER — Inpatient Hospital Stay: Payer: 59 | Attending: Hematology & Oncology

## 2017-12-13 ENCOUNTER — Encounter: Payer: Self-pay | Admitting: Family

## 2017-12-13 ENCOUNTER — Inpatient Hospital Stay (HOSPITAL_BASED_OUTPATIENT_CLINIC_OR_DEPARTMENT_OTHER): Payer: 59 | Admitting: Family

## 2017-12-13 ENCOUNTER — Other Ambulatory Visit: Payer: Self-pay

## 2017-12-13 VITALS — BP 137/85 | HR 78 | Temp 98.8°F | Resp 16 | Wt 225.0 lb

## 2017-12-13 DIAGNOSIS — N920 Excessive and frequent menstruation with regular cycle: Secondary | ICD-10-CM

## 2017-12-13 DIAGNOSIS — D560 Alpha thalassemia: Secondary | ICD-10-CM

## 2017-12-13 DIAGNOSIS — D500 Iron deficiency anemia secondary to blood loss (chronic): Secondary | ICD-10-CM | POA: Diagnosis present

## 2017-12-13 LAB — CBC WITH DIFFERENTIAL (CANCER CENTER ONLY)
Basophils Absolute: 0 10*3/uL (ref 0.0–0.1)
Basophils Relative: 0 %
EOS PCT: 2 %
Eosinophils Absolute: 0.1 10*3/uL (ref 0.0–0.5)
HCT: 41.8 % (ref 34.8–46.6)
Hemoglobin: 14.7 g/dL (ref 11.6–15.9)
LYMPHS ABS: 1.9 10*3/uL (ref 0.9–3.3)
Lymphocytes Relative: 45 %
MCH: 30.6 pg (ref 26.0–34.0)
MCHC: 35.2 g/dL (ref 32.0–36.0)
MCV: 86.9 fL (ref 81.0–101.0)
MONO ABS: 0.3 10*3/uL (ref 0.1–0.9)
MONOS PCT: 7 %
Neutro Abs: 1.9 10*3/uL (ref 1.5–6.5)
Neutrophils Relative %: 46 %
PLATELETS: 266 10*3/uL (ref 145–400)
RBC: 4.81 MIL/uL (ref 3.70–5.32)
RDW: 12.3 % (ref 11.1–15.7)
WBC Count: 4.2 10*3/uL (ref 3.9–10.0)

## 2017-12-13 NOTE — Progress Notes (Signed)
Hematology and Oncology Follow Up Visit  Stacy Miles [id number] 1975/10/15 42 y.o. 12/13/2017   Principle Diagnosis:  Iron deficiency anemia secondary to menorrhagia  Current Therapy:   IV iron as indicated - last received inJanuary2019   Interim History:  Stacy Miles is here today for follow-up. She is doing well and has no complaints at this time.  She is still on birth control with a cycle coming every 3-4 month lasting 4 days, the first 2 days heavy. She will sometimes spot in between. No other bleeding. No bruising or petechiae.  No fever, chills, n/v, cough, rash, dizziness, SOB, chest pain, palpitations, abdominal pain or changes in bowel or bladder habits.  No swelling, tenderness, numbness or tingling in her extremities.  No lymphadenopathy noted on exam.  She has a good appetite and is staying well hydrated. Her weight is stable.   ECOG Performance Status: 1 - Symptomatic but completely ambulatory  Medications:  Allergies as of 12/13/2017   No Known Allergies     Medication List        Accurate as of 12/13/17  3:57 PM. Always use your most recent med list.          acetaminophen 650 MG CR tablet Commonly known as:  TYLENOL Take 650 mg by mouth every 8 (eight) hours as needed for pain or fever.   Cholecalciferol 50000 units capsule Take 1 capsule (50,000 Units total) by mouth once a week.   folic acid 1 MG tablet Commonly known as:  FOLVITE Take 1 tablet (1 mg total) by mouth daily.   furosemide 20 MG tablet Commonly known as:  LASIX Take 1 tablet (20 mg total) by mouth daily as needed.   ibuprofen 800 MG tablet Commonly known as:  ADVIL,MOTRIN Take 1 tablet (800 mg total) by mouth every 8 (eight) hours as needed.   Levonorgestrel-Ethinyl Estradiol 0.1-0.02 & 0.01 MG tablet Commonly known as:  AMETHIA,CAMRESE Take 1 tablet by mouth daily.   metroNIDAZOLE 500 MG tablet Commonly known as:  FLAGYL Take 1 tablet (500 mg total) by mouth 2 (two) times  daily.   multivitamin with minerals Tabs tablet Take 1 tablet by mouth daily.   phentermine 37.5 MG tablet Commonly known as:  ADIPEX-P Take 1 tablet (37.5 mg total) by mouth daily before breakfast.       Allergies: No Known Allergies  Past Medical History, Surgical history, Social history, and Family History were reviewed and updated.  Review of Systems: All other 10 point review of systems is negative.   Physical Exam:  weight is 225 lb (102.1 kg). Her oral temperature is 98.8 F (37.1 C). Her blood pressure is 137/85 and her pulse is 78. Her respiration is 16 and oxygen saturation is 100%.   Wt Readings from Last 3 Encounters:  12/13/17 225 lb (102.1 kg)  10/04/17 226 lb 4 oz (102.6 kg)  10/04/17 225 lb (102.1 kg)    Ocular: Sclerae unicteric, pupils equal, round and reactive to light Ear-nose-throat: Oropharynx clear, dentition fair Lymphatic: No cervical, supraclavicular or axillary adenopathy Lungs no rales or rhonchi, good excursion bilaterally Heart regular rate and rhythm, no murmur appreciated Abd soft, nontender, positive bowel sounds, no liver or spleen tip palpated on exam, no fluid wave  MSK no focal spinal tenderness, no joint edema Neuro: non-focal, well-oriented, appropriate affect Breasts: Deferred   Lab Results  Component Value Date   WBC 4.2 12/13/2017   HGB 14.7 12/13/2017   HCT 41.8 12/13/2017   MCV  86.9 12/13/2017   PLT 266 12/13/2017   Lab Results  Component Value Date   FERRITIN 124 08/02/2017   IRON 89 08/02/2017   TIBC 341 08/02/2017   UIBC 252 08/02/2017   IRONPCTSAT 26 08/02/2017   Lab Results  Component Value Date   RETICCTPCT 1.8 08/02/2017   RBC 4.81 12/13/2017   No results found for: KPAFRELGTCHN, LAMBDASER, KAPLAMBRATIO No results found for: IGGSERUM, IGA, IGMSERUM No results found for: Odetta Pink, SPEI   Chemistry      Component Value Date/Time   NA 137  06/21/2017 1134   NA 135 09/29/2016 0820   K 4.0 06/21/2017 1134   K 3.6 09/29/2016 0820   CL 106 06/21/2017 1134   CL 109 (H) 09/29/2016 0820   CO2 23 06/21/2017 1134   CO2 21 09/29/2016 0820   BUN 12 06/21/2017 1134   BUN 7 09/29/2016 0820   CREATININE 0.69 06/21/2017 1134   CREATININE 0.5 (L) 09/29/2016 0820      Component Value Date/Time   CALCIUM 9.1 06/21/2017 1134   CALCIUM 8.7 09/29/2016 0820   ALKPHOS 66 06/21/2017 1134   ALKPHOS 82 09/29/2016 0820   AST 18 06/21/2017 1134   AST 22 09/29/2016 0820   ALT 16 06/21/2017 1134   ALT 23 09/29/2016 0820   BILITOT 0.4 06/21/2017 1134   BILITOT 0.70 09/29/2016 0820      Impression and Plan: Stacy Miles is a very pleasant 42 yo African American female with iron deficiency anemia secondary to heavy cycles, now on oral contraception. She continues to do well and has no complaints at this time.  We will see what her iron studies show and bring her back in for infusion if needed.  We will plan to see her back in another 4 months.  She will contact our office with any questions or concerns. We can certainly see her sooner if need be.   Stacy Peace, NP 9/19/20193:57 PM

## 2017-12-14 LAB — IRON AND TIBC
Iron: 101 ug/dL (ref 41–142)
SATURATION RATIOS: 24 % (ref 21–57)
TIBC: 421 ug/dL (ref 236–444)
UIBC: 320 ug/dL

## 2017-12-14 LAB — RETICULOCYTES
RBC.: 4.71 MIL/uL (ref 3.70–5.45)
Retic Count, Absolute: 75.4 10*3/uL (ref 33.7–90.7)
Retic Ct Pct: 1.6 % (ref 0.7–2.1)

## 2017-12-14 LAB — FERRITIN: Ferritin: 17 ng/mL (ref 11–307)

## 2018-01-01 ENCOUNTER — Other Ambulatory Visit: Payer: Self-pay | Admitting: Internal Medicine

## 2018-01-01 DIAGNOSIS — E559 Vitamin D deficiency, unspecified: Secondary | ICD-10-CM

## 2018-01-01 DIAGNOSIS — R609 Edema, unspecified: Secondary | ICD-10-CM

## 2018-01-01 MED ORDER — FUROSEMIDE 20 MG PO TABS: 20.0000 mg | ORAL_TABLET | Freq: Every day | ORAL | 11 refills | Status: DC | PRN

## 2018-02-07 ENCOUNTER — Ambulatory Visit: Payer: 59 | Admitting: Internal Medicine

## 2018-02-19 ENCOUNTER — Other Ambulatory Visit: Payer: Self-pay | Admitting: Internal Medicine

## 2018-02-19 DIAGNOSIS — E669 Obesity, unspecified: Secondary | ICD-10-CM

## 2018-02-19 MED ORDER — PHENTERMINE HCL 37.5 MG PO TABS: 37.5000 mg | ORAL_TABLET | Freq: Every day | ORAL | 0 refills | Status: DC

## 2018-03-07 ENCOUNTER — Ambulatory Visit: Payer: 59 | Admitting: Internal Medicine

## 2018-03-07 ENCOUNTER — Encounter: Payer: Self-pay | Admitting: Internal Medicine

## 2018-03-07 VITALS — BP 144/90 | HR 88 | Temp 98.2°F | Ht 66.75 in | Wt 230.2 lb

## 2018-03-07 DIAGNOSIS — Z23 Encounter for immunization: Secondary | ICD-10-CM

## 2018-03-07 DIAGNOSIS — F329 Major depressive disorder, single episode, unspecified: Secondary | ICD-10-CM

## 2018-03-07 DIAGNOSIS — F32A Depression, unspecified: Secondary | ICD-10-CM | POA: Insufficient documentation

## 2018-03-07 DIAGNOSIS — R030 Elevated blood-pressure reading, without diagnosis of hypertension: Secondary | ICD-10-CM | POA: Diagnosis not present

## 2018-03-07 DIAGNOSIS — Z1231 Encounter for screening mammogram for malignant neoplasm of breast: Secondary | ICD-10-CM

## 2018-03-07 DIAGNOSIS — F439 Reaction to severe stress, unspecified: Secondary | ICD-10-CM | POA: Diagnosis not present

## 2018-03-07 DIAGNOSIS — E669 Obesity, unspecified: Secondary | ICD-10-CM

## 2018-03-07 NOTE — Progress Notes (Signed)
Pre visit review using our clinic review tool, if applicable. No additional management support is needed unless otherwise documented below in the visit note. 

## 2018-03-07 NOTE — Patient Instructions (Addendum)
L theanine supplement for stress, anxiety, sleep ([employer], Whole foods)  Increase your sleep  Aromatherapy  Massage or Facial    Referred to therapy [facility] in [place] on [address]    Stress and Stress Management Stress is a normal reaction to life events. It is what you feel when life demands more than you are used to or more than you can handle. Some stress can be useful. For example, the stress reaction can help you catch the last bus of the day, study for a test, or meet a deadline at work. But stress that occurs too often or for too long can cause problems. It can affect your emotional health and interfere with relationships and normal daily activities. Too much stress can weaken your immune system and increase your risk for physical illness. If you already have a medical problem, stress can make it worse. What are the causes? All sorts of life events may cause stress. An event that causes stress for one person may not be stressful for another person. Major life events commonly cause stress. These may be positive or negative. Examples include losing your job, moving into a new home, getting married, having a baby, or losing a loved one. Less obvious life events may also cause stress, especially if they occur day after day or in combination. Examples include working long hours, driving in traffic, caring for children, being in debt, or being in a difficult relationship. What are the signs or symptoms? Stress may cause emotional symptoms including, the following:  Anxiety. This is feeling worried, afraid, on edge, overwhelmed, or out of control.  Anger. This is feeling irritated or impatient.  Depression. This is feeling sad, down, helpless, or guilty.  Difficulty focusing, remembering, or making decisions.  Stress may cause physical symptoms, including the following:  Aches and pains. These may affect your head, neck, back, stomach, or other areas of your body.  Tight  muscles or clenched jaw.  Low energy or trouble sleeping.  Stress may cause unhealthy behaviors, including the following:  Eating to feel better (overeating) or skipping meals.  Sleeping too little, too much, or both.  Working too much or putting off tasks (procrastination).  Smoking, drinking alcohol, or using drugs to feel better.  How is this diagnosed? Stress is diagnosed through an assessment by your health care provider. Your health care provider will ask questions about your symptoms and any stressful life events.Your health care provider will also ask about your medical history and may order blood tests or other tests. Certain medical conditions and medicine can cause physical symptoms similar to stress. Mental illness can cause emotional symptoms and unhealthy behaviors similar to stress. Your health care provider may refer you to a mental health professional for further evaluation. How is this treated? Stress management is the recommended treatment for stress.The goals of stress management are reducing stressful life events and coping with stress in healthy ways. Techniques for reducing stressful life events include the following:  Stress identification. Self-monitor for stress and identify what causes stress for you. These skills may help you to avoid some stressful events.  Time management. Set your priorities, keep a calendar of events, and learn to say "no." These tools can help you avoid making too many commitments.  Techniques for coping with stress include the following:  Rethinking the problem. Try to think realistically about stressful events rather than ignoring them or overreacting. Try to find the positives in a stressful situation rather than focusing  on the negatives.  Exercise. Physical exercise can release both physical and emotional tension. The key is to find a form of exercise you enjoy and do it regularly.  Relaxation techniques. These relax the body and  mind. Examples include yoga, meditation, tai chi, biofeedback, deep breathing, progressive muscle relaxation, listening to music, being out in nature, journaling, and other hobbies. Again, the key is to find one or more that you enjoy and can do regularly.  Healthy lifestyle. Eat a balanced diet, get plenty of sleep, and do not smoke. Avoid using alcohol or drugs to relax.  Strong support network. Spend time with family, friends, or other people you enjoy being around.Express your feelings and talk things over with someone you trust.  Counseling or talktherapy with a mental health professional may be helpful if you are having difficulty managing stress on your own. Medicine is typically not recommended for the treatment of stress.Talk to your health care provider if you think you need medicine for symptoms of stress. Follow these instructions at home:  Keep all follow-up visits as directed by your health care provider.  Take all medicines as directed by your health care provider. Contact a health care provider if:  Your symptoms get worse or you start having new symptoms.  You feel overwhelmed by your problems and can no longer manage them on your own. Get help right away if:  You feel like hurting yourself or someone else. This information is not intended to replace advice given to you by your health care provider. Make sure you discuss any questions you have with your health care provider. Document Released: 09/06/2000 Document Revised: 08/19/2015 Document Reviewed: 11/05/2012 Elsevier Interactive Patient Education  2017 [employer].   Hypertension Hypertension, commonly called high blood pressure, is when the force of blood pumping through the arteries is too strong. The arteries are the blood vessels that carry blood from the heart throughout the body. Hypertension forces the heart to work harder to pump blood and may cause arteries to become narrow or stiff. Having untreated or  uncontrolled hypertension can cause heart attacks, strokes, kidney disease, and other problems. A blood pressure reading consists of a higher number over a lower number. Ideally, your blood pressure should be below 120/80. The first ("top") number is called the systolic pressure. It is a measure of the pressure in your arteries as your heart beats. The second ("bottom") number is called the diastolic pressure. It is a measure of the pressure in your arteries as the heart relaxes. What are the causes? The cause of this condition is not known. What increases the risk? Some risk factors for high blood pressure are under your control. Others are not. Factors you can change  Smoking.  Having type 2 diabetes mellitus, high cholesterol, or both.  Not getting enough exercise or physical activity.  Being overweight.  Having too much fat, sugar, calories, or salt (sodium) in your diet.  Drinking too much alcohol. Factors that are difficult or impossible to change  Having chronic kidney disease.  Having a family history of high blood pressure.  Age. Risk increases with age.  Race. You may be at higher risk if you are African-American.  Gender. Men are at higher risk than women before age 44. After age 86, women are at higher risk than men.  Having obstructive sleep apnea.  Stress. What are the signs or symptoms? Extremely high blood pressure (hypertensive crisis) may cause:  Headache.  Anxiety.  Shortness of breath.  Nosebleed.  Nausea and vomiting.  Severe chest pain.  Jerky movements you cannot control (seizures).  How is this diagnosed? This condition is diagnosed by measuring your blood pressure while you are seated, with your arm resting on a surface. The cuff of the blood pressure monitor will be placed directly against the skin of your upper arm at the level of your heart. It should be measured at least twice using the same arm. Certain conditions can cause a  difference in blood pressure between your right and left arms. Certain factors can cause blood pressure readings to be lower or higher than normal (elevated) for a short period of time:  When your blood pressure is higher when you are in a health care provider's office than when you are at home, this is called white coat hypertension. Most people with this condition do not need medicines.  When your blood pressure is higher at home than when you are in a health care provider's office, this is called masked hypertension. Most people with this condition may need medicines to control blood pressure.  If you have a high blood pressure reading during one visit or you have normal blood pressure with other risk factors:  You may be asked to return on a different day to have your blood pressure checked again.  You may be asked to monitor your blood pressure at home for 1 week or longer.  If you are diagnosed with hypertension, you may have other blood or imaging tests to help your health care provider understand your overall risk for other conditions. How is this treated? This condition is treated by making healthy lifestyle changes, such as eating healthy foods, exercising more, and reducing your alcohol intake. Your health care provider may prescribe medicine if lifestyle changes are not enough to get your blood pressure under control, and if:  Your systolic blood pressure is above 130.  Your diastolic blood pressure is above 80.  Your personal target blood pressure may vary depending on your medical conditions, your age, and other factors. Follow these instructions at home: Eating and drinking  Eat a diet that is high in fiber and potassium, and low in sodium, added sugar, and fat. An example eating plan is called the DASH (Dietary Approaches to Stop Hypertension) diet. To eat this way: ? Eat plenty of fresh fruits and vegetables. Try to fill half of your plate at each meal with fruits and  vegetables. ? Eat whole grains, such as whole wheat pasta, brown rice, or whole grain bread. Fill about one quarter of your plate with whole grains. ? Eat or drink low-fat dairy products, such as skim milk or low-fat yogurt. ? Avoid fatty cuts of meat, processed or cured meats, and poultry with skin. Fill about one quarter of your plate with lean proteins, such as fish, chicken without skin, beans, eggs, and tofu. ? Avoid premade and processed foods. These tend to be higher in sodium, added sugar, and fat.  Reduce your daily sodium intake. Most people with hypertension should eat less than 1,500 mg of sodium a day.  Limit alcohol intake to no more than 1 drink a day for nonpregnant women and 2 drinks a day for men. One drink equals 12 oz of beer, 5 oz of wine, or 1 oz of hard liquor. Lifestyle  Work with your health care provider to maintain a healthy body weight or to lose weight. Ask what an ideal weight is for you.  Get at least 30  minutes of exercise that causes your heart to beat faster (aerobic exercise) most days of the week. Activities may include walking, swimming, or biking.  Include exercise to strengthen your muscles (resistance exercise), such as pilates or lifting weights, as part of your weekly exercise routine. Try to do these types of exercises for 30 minutes at least 3 days a week.  Do not use any products that contain nicotine or tobacco, such as cigarettes and e-cigarettes. If you need help quitting, ask your health care provider.  Monitor your blood pressure at home as told by your health care provider.  Keep all follow-up visits as told by your health care provider. This is important. Medicines  Take over-the-counter and prescription medicines only as told by your health care provider. Follow directions carefully. Blood pressure medicines must be taken as prescribed.  Do not skip doses of blood pressure medicine. Doing this puts you at risk for problems and can make  the medicine less effective.  Ask your health care provider about side effects or reactions to medicines that you should watch for. Contact a health care provider if:  You think you are having a reaction to a medicine you are taking.  You have headaches that keep coming back (recurring).  You feel dizzy.  You have swelling in your ankles.  You have trouble with your vision. Get help right away if:  You develop a severe headache or confusion.  You have unusual weakness or numbness.  You feel faint.  You have severe pain in your chest or abdomen.  You vomit repeatedly.  You have trouble breathing. Summary  Hypertension is when the force of blood pumping through your arteries is too strong. If this condition is not controlled, it may put you at risk for serious complications.  Your personal target blood pressure may vary depending on your medical conditions, your age, and other factors. For most people, a normal blood pressure is less than 120/80.  Hypertension is treated with lifestyle changes, medicines, or a combination of both. Lifestyle changes include weight loss, eating a healthy, low-sodium diet, exercising more, and limiting alcohol. This information is not intended to replace advice given to you by your health care provider. Make sure you discuss any questions you have with your health care provider. Document Released: 03/13/2005 Document Revised: 02/09/2016 Document Reviewed: 02/09/2016 Elsevier Interactive Patient Education  2018 [place] Eating Plan DASH stands for "Dietary Approaches to Stop Hypertension." The DASH eating plan is a healthy eating plan that has been shown to reduce high blood pressure (hypertension). It may also reduce your risk for type 2 diabetes, heart disease, and stroke. The DASH eating plan may also help with weight loss. What are tips for following this plan? General guidelines  Avoid eating more than 2,300 mg (milligrams) of  salt (sodium) a day. If you have hypertension, you may need to reduce your sodium intake to 1,500 mg a day.  Limit alcohol intake to no more than 1 drink a day for nonpregnant women and 2 drinks a day for men. One drink equals 12 oz of beer, 5 oz of wine, or 1 oz of hard liquor.  Work with your health care provider to maintain a healthy body weight or to lose weight. Ask what an ideal weight is for you.  Get at least 30 minutes of exercise that causes your heart to beat faster (aerobic exercise) most days of the week. Activities may include walking, swimming, or biking.  Work  with your health care provider or diet and nutrition specialist (dietitian) to adjust your eating plan to your individual calorie needs. Reading food labels  Check food labels for the amount of sodium per serving. Choose foods with less than 5 percent of the Daily Value of sodium. Generally, foods with less than 300 mg of sodium per serving fit into this eating plan.  To find whole grains, look for the word "whole" as the first word in the ingredient list. Shopping  Buy products labeled as "low-sodium" or "no salt added."  Buy fresh foods. Avoid canned foods and premade or frozen meals. Cooking  Avoid adding salt when cooking. Use salt-free seasonings or herbs instead of table salt or sea salt. Check with your health care provider or pharmacist before using salt substitutes.  Do not fry foods. Cook foods using healthy methods such as baking, boiling, grilling, and broiling instead.  Cook with heart-healthy oils, such as olive, canola, soybean, or sunflower oil. Meal planning   Eat a balanced diet that includes: ? 5 or more servings of fruits and vegetables each day. At each meal, try to fill half of your plate with fruits and vegetables. ? Up to 6-8 servings of whole grains each day. ? Less than 6 oz of lean meat, poultry, or fish each day. A 3-oz serving of meat is about the same size as a deck of cards. One  egg equals 1 oz. ? 2 servings of low-fat dairy each day. ? A serving of nuts, seeds, or beans 5 times each week. ? Heart-healthy fats. Healthy fats called Omega-3 fatty acids are found in foods such as flaxseeds and coldwater fish, like sardines, salmon, and mackerel.  Limit how much you eat of the following: ? Canned or prepackaged foods. ? Food that is high in trans fat, such as fried foods. ? Food that is high in saturated fat, such as fatty meat. ? Sweets, desserts, sugary drinks, and other foods with added sugar. ? Full-fat dairy products.  Do not salt foods before eating.  Try to eat at least 2 vegetarian meals each week.  Eat more home-cooked food and less restaurant, buffet, and fast food.  When eating at a restaurant, ask that your food be prepared with less salt or no salt, if possible. What foods are recommended? The items listed may not be a complete list. Talk with your dietitian about what dietary choices are best for you. Grains Whole-grain or whole-wheat bread. Whole-grain or whole-wheat pasta. Brown rice. [place]. Bulgur. Whole-grain and low-sodium cereals. Pita bread. Low-fat, low-sodium crackers. Whole-wheat flour tortillas. Vegetables Fresh or frozen vegetables (raw, steamed, roasted, or grilled). Low-sodium or reduced-sodium tomato and vegetable juice. Low-sodium or reduced-sodium tomato sauce and tomato paste. Low-sodium or reduced-sodium canned vegetables. Fruits All fresh, dried, or frozen fruit. Canned fruit in natural juice (without added sugar). Meat and other protein foods Skinless chicken or [place]. Ground chicken or [place]. Pork with fat trimmed off. Fish and seafood. Egg whites. Dried beans, peas, or lentils. Unsalted nuts, nut butters, and seeds. Unsalted canned beans. Lean cuts of beef with fat trimmed off. Low-sodium, lean deli meat. Dairy Low-fat (1%) or fat-free (skim) milk. Fat-free, low-fat, or reduced-fat cheeses. Nonfat, low-sodium ricotta  or cottage cheese. Low-fat or nonfat yogurt. Low-fat, low-sodium cheese. Fats and oils Soft margarine without trans fats. Vegetable oil. Low-fat, reduced-fat, or light mayonnaise and salad dressings (reduced-sodium). Canola, safflower, olive, soybean, and sunflower oils. Avocado. Seasoning and other foods Herbs. Spices. Seasoning mixes without  salt. Unsalted popcorn and pretzels. Fat-free sweets. What foods are not recommended? The items listed may not be a complete list. Talk with your dietitian about what dietary choices are best for you. Grains Baked goods made with fat, such as croissants, muffins, or some breads. Dry pasta or rice meal packs. Vegetables Creamed or fried vegetables. Vegetables in a cheese sauce. Regular canned vegetables (not low-sodium or reduced-sodium). Regular canned tomato sauce and paste (not low-sodium or reduced-sodium). Regular tomato and vegetable juice (not low-sodium or reduced-sodium). Angie Fava. Olives. Fruits Canned fruit in a light or heavy syrup. Fried fruit. Fruit in cream or butter sauce. Meat and other protein foods Fatty cuts of meat. Ribs. Fried meat. Berniece Salines. Sausage. Bologna and other processed lunch meats. Salami. Fatback. Hotdogs. Bratwurst. Salted nuts and seeds. Canned beans with added salt. Canned or smoked fish. Whole eggs or egg yolks. Chicken or [place] with skin. Dairy Whole or 2% milk, cream, and half-and-half. Whole or full-fat cream cheese. Whole-fat or sweetened yogurt. Full-fat cheese. Nondairy creamers. Whipped toppings. Processed cheese and cheese spreads. Fats and oils Butter. Stick margarine. Lard. Shortening. Ghee. Bacon fat. Tropical oils, such as coconut, palm kernel, or palm oil. Seasoning and other foods Salted popcorn and pretzels. Onion salt, garlic salt, seasoned salt, table salt, and sea salt. Worcestershire sauce. Tartar sauce. Barbecue sauce. Teriyaki sauce. Soy sauce, including reduced-sodium. Steak sauce. Canned and packaged  gravies. Fish sauce. Oyster sauce. Cocktail sauce. Horseradish that you find on the shelf. Ketchup. Mustard. Meat flavorings and tenderizers. Bouillon cubes. Hot sauce and Tabasco sauce. Premade or packaged marinades. Premade or packaged taco seasonings. Relishes. Regular salad dressings. Where to find more information:  National Heart, Lung, and [place]: https://wilson-eaton.com/  American Heart Association: www.heart.org Summary  The DASH eating plan is a healthy eating plan that has been shown to reduce high blood pressure (hypertension). It may also reduce your risk for type 2 diabetes, heart disease, and stroke.  With the DASH eating plan, you should limit salt (sodium) intake to 2,300 mg a day. If you have hypertension, you may need to reduce your sodium intake to 1,500 mg a day.  When on the DASH eating plan, aim to eat more fresh fruits and vegetables, whole grains, lean proteins, low-fat dairy, and heart-healthy fats.  Work with your health care provider or diet and nutrition specialist (dietitian) to adjust your eating plan to your individual calorie needs. This information is not intended to replace advice given to you by your health care provider. Make sure you discuss any questions you have with your health care provider. Document Released: 03/02/2011 Document Revised: 03/06/2016 Document Reviewed: 03/06/2016 Elsevier Interactive Patient Education  [employer].

## 2018-03-07 NOTE — Progress Notes (Signed)
Chief Complaint  Patient presents with  . Follow-up   F/u  1. Stress, depression, insomnia due to personal issues, working 2 jobs and in school though out for [employer] break she has started to isolate herself and is unsure if wants to go home for Xmas and hasnt been getting together with friends. She is agreeable to therapy  2. Elevated BP reading today  3. Due for 3rd hep B vaccine today  4. Weight/obesity she has gained 5 lbs, not working out and eating bad food to to time constraints. She still has 1 adipex refill left.    Review of Systems  Constitutional: Negative for weight loss.  HENT: Negative for hearing loss.   Eyes: Negative for blurred vision.  Respiratory: Negative for shortness of breath.   Cardiovascular: Negative for chest pain.  Gastrointestinal: Negative for abdominal pain.  Skin: Negative for rash.  Psychiatric/Behavioral: Positive for depression.       +stress     Past Medical History:  Diagnosis Date  . Anemia   . Dysmenorrhea   . Fibroid   . History of blood transfusion 09/2016   [facility] - 2 units transfused  . Iron deficiency anemia    Past Surgical History:  Procedure Laterality Date  . DILATATION & CURETTAGE/HYSTEROSCOPY WITH MYOSURE N/A 11/13/2016   Procedure: DILATATION & CURETTAGE/HYSTEROSCOPY WITH MYOSURE;  Surgeon: Salvadore Dom, MD;  Location: [facility];  Service: Gynecology;  Laterality: N/A;  . DILATION AND CURETTAGE OF UTERUS    . OTHER SURGICAL HISTORY     cervical polyp removal    Family History  Problem Relation Age of Onset  . Diabetes Mother   . Hypertension Mother   . Arthritis Mother   . Asthma Mother   . COPD Father   . Breast cancer Maternal Grandmother        mat great gm   Social History   Socioeconomic History  . Marital status: Single    Spouse name: Not on file  . Number of children: Not on file  . Years of education: Not on file  . Highest education level: Not on file  Occupational History  . Not on file  Social  Needs  . Financial resource strain: Not on file  . Food insecurity:    Worry: Not on file    Inability: Not on file  . Transportation needs:    Medical: Not on file    Non-medical: Not on file  Tobacco Use  . Smoking status: Never Smoker  . Smokeless tobacco: Never Used  Substance and Sexual Activity  . Alcohol use: No  . Drug use: No  . Sexual activity: Yes    Birth control/protection: None  Lifestyle  . Physical activity:    Days per week: Not on file    Minutes per session: Not on file  . Stress: Not on file  Relationships  . Social connections:    Talks on phone: Not on file    Gets together: Not on file    Attends religious service: Not on file    Active member of club or organization: Not on file    Attends meetings of clubs or organizations: Not on file    Relationship status: Not on file  . Intimate partner violence:    Fear of current or ex partner: Not on file    Emotionally abused: Not on file    Physically abused: Not on file    Forced sexual activity: Not on file  Other  Topics Concern  . Not on file  Social History Narrative   In school for criminal justice   Works full and part time job    Masters degree, Freight forwarder    No kids    Lives in [place]    No guns    Wears seat belt    Safe in relationship    Current Meds  Medication Sig  . acetaminophen (TYLENOL) 650 MG CR tablet Take 650 mg by mouth every 8 (eight) hours as needed for pain or fever.  . Cholecalciferol 50000 units capsule Take 1 capsule (50,000 Units total) by mouth once a week.  . folic acid (FOLVITE) 1 MG tablet Take 1 tablet (1 mg total) by mouth daily.  . furosemide (LASIX) 20 MG tablet Take 1 tablet (20 mg total) by mouth daily as needed.  Marland Kitchen ibuprofen (ADVIL,MOTRIN) 800 MG tablet Take 1 tablet (800 mg total) by mouth every 8 (eight) hours as needed.  . Levonorgestrel-Ethinyl Estradiol (LOSEASONIQUE) 0.1-0.02 & 0.01 MG tablet Take 1 tablet by mouth daily.  . metroNIDAZOLE (FLAGYL) 500 MG  tablet Take 1 tablet (500 mg total) by mouth 2 (two) times daily.  . Multiple Vitamin (MULTIVITAMIN WITH MINERALS) TABS tablet Take 1 tablet by mouth daily.  . phentermine (ADIPEX-P) 37.5 MG tablet Take 1 tablet (37.5 mg total) by mouth daily before breakfast.   No Known Allergies Recent Results (from the past 2160 hour(s))  CBC with Differential (Cancer Center Only)     Status: None   Collection Time: 12/13/17  3:20 PM  Result Value Ref Range   WBC Count 4.2 3.9 - 10.0 K/uL   RBC 4.81 3.70 - 5.32 MIL/uL   Hemoglobin 14.7 11.6 - 15.9 g/dL   HCT 41.8 34.8 - 46.6 %   MCV 86.9 81.0 - 101.0 fL   MCH 30.6 26.0 - 34.0 pg   MCHC 35.2 32.0 - 36.0 g/dL   RDW 12.3 11.1 - 15.7 %   Platelet Count 266 145 - 400 K/uL   Neutrophils Relative % 46 %   Neutro Abs 1.9 1.5 - 6.5 K/uL   Lymphocytes Relative 45 %   Lymphs Abs 1.9 0.9 - 3.3 K/uL   Monocytes Relative 7 %   Monocytes Absolute 0.3 0.1 - 0.9 K/uL   Eosinophils Relative 2 %   Eosinophils Absolute 0.1 0.0 - 0.5 K/uL   Basophils Relative 0 %   Basophils Absolute 0.0 0.0 - 0.1 K/uL    Comment: Performed at [facility] at [facility], [address] [place], [place] [address]  Reticulocytes     Status: None   Collection Time: 12/13/17  3:20 PM  Result Value Ref Range   Retic Ct Pct 1.6 0.7 - 2.1 %   RBC. 4.71 3.70 - 5.45 MIL/uL   Retic Count, Absolute 75.4 33.7 - 90.7 K/uL    Comment: Performed at [facility], [place] [address] [place], [place] [address]  Iron and TIBC     Status: None   Collection Time: 12/13/17  3:21 PM  Result Value Ref Range   Iron 101 41 - 142 ug/dL   TIBC 421 236 - 444 ug/dL   Saturation Ratios 24 21 - 57 %   UIBC 320 ug/dL    Comment: Performed at [facility], [place] [address] [place], [place] [address]  Ferritin     Status: None   Collection Time: 12/13/17  3:21 PM  Result  Value Ref Range   Ferritin 17 11 - 307 ng/mL    Comment:  Performed at [facility], [place] [address] [place], [place] [address]   Objective  Body mass index is 36.32 kg/m. Wt Readings from Last 3 Encounters:  03/07/18 230 lb 3.2 oz (104.4 kg)  12/13/17 225 lb (102.1 kg)  10/04/17 226 lb 4 oz (102.6 kg)   Temp Readings from Last 3 Encounters:  03/07/18 98.2 F (36.8 C) (Oral)  12/13/17 98.8 F (37.1 C) (Oral)  10/04/17 98 F (36.7 C) (Oral)   BP Readings from Last 3 Encounters:  03/07/18 (!) 152/76  12/13/17 137/85  10/04/17 132/68   Pulse Readings from Last 3 Encounters:  03/07/18 88  12/13/17 78  10/04/17 86    Physical Exam Vitals signs and nursing note reviewed.  Constitutional:      General: She is awake.     Appearance: Normal appearance. She is obese.  HENT:     Head: Normocephalic and atraumatic.  Eyes:     Conjunctiva/sclera: Conjunctivae normal.     Pupils: Pupils are equal, round, and reactive to light.  Cardiovascular:     Rate and Rhythm: Normal rate and regular rhythm.     Heart sounds: Normal heart sounds.  Pulmonary:     Effort: Pulmonary effort is normal.     Breath sounds: Normal breath sounds.  Skin:    General: Skin is warm and dry.  Neurological:     General: No focal deficit present.     Mental Status: She is alert and oriented to person, place, and time. Mental status is at baseline.     Gait: Gait normal.  Psychiatric:        Mood and Affect: Mood normal.        Speech: Speech normal.        Behavior: Behavior normal. Behavior is cooperative.        Thought Content: Thought content normal.        Cognition and Memory: Cognition and memory normal.        Judgment: Judgment normal.     Assessment   1. Stress, depression/insomnia  2. Elevated BP repeat from 152/76, 144/90 will monitor  3. Obesity 36.32  4. HM Plan   1. Refer to [facility]EL group for therapy  Disc work life balance, healthy eating, exercise L theanine  2. Monitor for now f/u in 4-6 months  3.  Rec  healthy diet choices and exercise  Has 1 more month of adipex  Reduce fast food  4.  Declines flu shot  Tdaputd Hep B vaccine 3/3 today Declines HIV check   LMP 03/20/17 has then q3 months with OCP. Pap neg OB/GYN 09/26/16 neg HPV Mammogram neg 4/18/19ordered repeat  Vit D def rec take 5000 IU qd starting end 11/2017 currently still taking 50K weekly   Provider: Dr. Olivia Mackie McLean-Scocuzza-Internal Medicine

## 2018-04-02 ENCOUNTER — Other Ambulatory Visit: Payer: Self-pay | Admitting: Obstetrics and Gynecology

## 2018-04-03 NOTE — Telephone Encounter (Signed)
Medication refill request: Ibuprofen 800mg  #30, 1R Last AEX:  10-04-17 Next AEX: 10-10-2018 Last MMG (if hormonal medication request): 07-12-17  Refill authorized: please advise

## 2018-04-11 ENCOUNTER — Inpatient Hospital Stay: Payer: PRIVATE HEALTH INSURANCE | Attending: Family | Admitting: Family

## 2018-04-11 ENCOUNTER — Inpatient Hospital Stay: Payer: PRIVATE HEALTH INSURANCE

## 2018-04-11 VITALS — BP 131/70 | HR 80 | Temp 98.4°F | Resp 18 | Ht 66.75 in | Wt 236.8 lb

## 2018-04-11 DIAGNOSIS — D500 Iron deficiency anemia secondary to blood loss (chronic): Secondary | ICD-10-CM

## 2018-04-11 DIAGNOSIS — N920 Excessive and frequent menstruation with regular cycle: Secondary | ICD-10-CM | POA: Diagnosis not present

## 2018-04-11 DIAGNOSIS — D509 Iron deficiency anemia, unspecified: Secondary | ICD-10-CM

## 2018-04-11 DIAGNOSIS — D560 Alpha thalassemia: Secondary | ICD-10-CM

## 2018-04-11 LAB — CBC WITH DIFFERENTIAL (CANCER CENTER ONLY)
Abs Immature Granulocytes: 0.02 10*3/uL (ref 0.00–0.07)
Basophils Absolute: 0 10*3/uL (ref 0.0–0.1)
Basophils Relative: 0 %
Eosinophils Absolute: 0.1 10*3/uL (ref 0.0–0.5)
Eosinophils Relative: 3 %
HCT: 41.8 % (ref 36.0–46.0)
Hemoglobin: 14.6 g/dL (ref 12.0–15.0)
Immature Granulocytes: 1 %
Lymphocytes Relative: 37 %
Lymphs Abs: 1.3 10*3/uL (ref 0.7–4.0)
MCH: 31.1 pg (ref 26.0–34.0)
MCHC: 34.9 g/dL (ref 30.0–36.0)
MCV: 89.1 fL (ref 80.0–100.0)
Monocytes Absolute: 0.3 10*3/uL (ref 0.1–1.0)
Monocytes Relative: 9 %
Neutro Abs: 1.8 10*3/uL (ref 1.7–7.7)
Neutrophils Relative %: 50 %
Platelet Count: 260 10*3/uL (ref 150–400)
RBC: 4.69 MIL/uL (ref 3.87–5.11)
RDW: 12.8 % (ref 11.5–15.5)
WBC Count: 3.6 10*3/uL — ABNORMAL LOW (ref 4.0–10.5)
nRBC: 0 % (ref 0.0–0.2)

## 2018-04-11 LAB — RETICULOCYTES
Immature Retic Fract: 9.3 % (ref 2.3–15.9)
RBC.: 4.69 MIL/uL (ref 3.87–5.11)
RETIC COUNT ABSOLUTE: 99 10*3/uL (ref 19.0–186.0)
Retic Ct Pct: 2.1 % (ref 0.4–3.1)

## 2018-04-11 NOTE — Progress Notes (Signed)
Hematology and Oncology Follow Up Visit  Stacy Miles [id number] Aug 13, 1975 42 y.o. 04/11/2018   Principle Diagnosis:  Iron deficiency anemia secondary to menorrhagia  Current Therapy:   IV iron as indicated   Interim History:  [name] is here today for follow-up. She is doing well and has no complaints at this time.  She is on birth control and does not have a cycle.  No bleeding, bruising or petechiae.  No fever, chills, n/v, cough, rash, dizziness, headache, SOB, chest pain, palpitations, abdominal pain or changes in bowel or bladder habits.  NO swelling, tenderness, numbness or tingling in her extremities at this time.  She has occasional positional numbness and tingling in her hands.  No lymphadenopathy noted on exam.  She has maintained a good appetite and is staying well hydrated. Her weight is stable.   ECOG Performance Status: 1 - Symptomatic but completely ambulatory  Medications:  Allergies as of 04/11/2018   No Known Allergies     Medication List       Accurate as of April 11, 2018 11:25 AM. Always use your most recent med list.        acetaminophen 650 MG CR tablet Commonly known as:  TYLENOL Take 650 mg by mouth every 8 (eight) hours as needed for pain or fever.   Cholecalciferol 1.25 MG (50000 UT) capsule Take 1 capsule (50,000 Units total) by mouth once a week.   folic acid 1 MG tablet Commonly known as:  FOLVITE Take 1 tablet (1 mg total) by mouth daily.   furosemide 20 MG tablet Commonly known as:  LASIX Take 1 tablet (20 mg total) by mouth daily as needed.   ibuprofen 800 MG tablet Commonly known as:  ADVIL,MOTRIN TAKE 1 TABLET BY MOUTH EVERY 8 HOURS AS NEEDED   Levonorgestrel-Ethinyl Estradiol 0.1-0.02 & 0.01 MG tablet Commonly known as:  LOSEASONIQUE Take 1 tablet by mouth daily.   metroNIDAZOLE 500 MG tablet Commonly known as:  FLAGYL Take 1 tablet (500 mg total) by mouth 2 (two) times daily.   multivitamin with minerals Tabs  tablet Take 1 tablet by mouth daily.   phentermine 37.5 MG tablet Commonly known as:  ADIPEX-P Take 1 tablet (37.5 mg total) by mouth daily before breakfast.       Allergies: No Known Allergies  Past Medical History, Surgical history, Social history, and Family History were reviewed and updated.  Review of Systems: All other 10 point review of systems is negative.   Physical Exam:  vitals were not taken for this visit.   Wt Readings from Last 3 Encounters:  03/07/18 230 lb 3.2 oz (104.4 kg)  12/13/17 225 lb (102.1 kg)  10/04/17 226 lb 4 oz (102.6 kg)    Ocular: Sclerae unicteric, pupils equal, round and reactive to light Ear-nose-throat: Oropharynx clear, dentition fair Lymphatic: No cervical, supraclavicular or axillary adenopathy Lungs no rales or rhonchi, good excursion bilaterally Heart regular rate and rhythm, no murmur appreciated Abd soft, nontender, positive bowel sounds, no liver or spleen tip palpated on exam, no fluid wave  MSK no focal spinal tenderness, no joint edema Neuro: non-focal, well-oriented, appropriate affect Breasts: Deferred   Lab Results  Component Value Date   WBC 3.6 (L) 04/11/2018   HGB 14.6 04/11/2018   HCT 41.8 04/11/2018   MCV 89.1 04/11/2018   PLT 260 04/11/2018   Lab Results  Component Value Date   FERRITIN 17 12/13/2017   IRON 101 12/13/2017   TIBC 421 12/13/2017  UIBC 320 12/13/2017   IRONPCTSAT 24 12/13/2017   Lab Results  Component Value Date   RETICCTPCT 2.1 04/11/2018   RBC 4.69 04/11/2018   RBC 4.69 04/11/2018   No results found for: KPAFRELGTCHN, LAMBDASER, KAPLAMBRATIO No results found for: IGGSERUM, IGA, IGMSERUM No results found for: Odetta Pink, SPEI   Chemistry      Component Value Date/Time   NA 137 06/21/2017 1134   NA 135 09/29/2016 0820   K 4.0 06/21/2017 1134   K 3.6 09/29/2016 0820   CL 106 06/21/2017 1134   CL 109 (H) 09/29/2016 0820   CO2  23 06/21/2017 1134   CO2 21 09/29/2016 0820   BUN 12 06/21/2017 1134   BUN 7 09/29/2016 0820   CREATININE 0.69 06/21/2017 1134   CREATININE 0.5 (L) 09/29/2016 0820      Component Value Date/Time   CALCIUM 9.1 06/21/2017 1134   CALCIUM 8.7 09/29/2016 0820   ALKPHOS 66 06/21/2017 1134   ALKPHOS 82 09/29/2016 0820   AST 18 06/21/2017 1134   AST 22 09/29/2016 0820   ALT 16 06/21/2017 1134   ALT 23 09/29/2016 0820   BILITOT 0.4 06/21/2017 1134   BILITOT 0.70 09/29/2016 0820       Impression and Plan: [name] is a very pleasant African American female with iron deficiency anemia. Now that she has started birth control she no longer has a cycle.  She continues to do well and has no complaints at this time.  We will see what her iron studies show and bring her back in for infusion if needed.  We will plan to see her back in another 4 months.  She will contact our office with any questions or concerns. We can certainly see her sooner if need be.   Laverna Peace, NP 1/16/202011:25 AM

## 2018-04-12 LAB — IRON AND TIBC
Iron: 95 ug/dL (ref 41–142)
Saturation Ratios: 25 % (ref 21–57)
TIBC: 379 ug/dL (ref 236–444)
UIBC: 284 ug/dL (ref 120–384)

## 2018-04-12 LAB — FERRITIN: Ferritin: 37 ng/mL (ref 11–307)

## 2018-04-15 ENCOUNTER — Encounter: Payer: Self-pay | Admitting: Internal Medicine

## 2018-05-23 ENCOUNTER — Other Ambulatory Visit: Payer: Self-pay | Admitting: Family

## 2018-05-23 ENCOUNTER — Telehealth: Payer: Self-pay | Admitting: Obstetrics and Gynecology

## 2018-05-23 DIAGNOSIS — D560 Alpha thalassemia: Secondary | ICD-10-CM

## 2018-05-23 DIAGNOSIS — D500 Iron deficiency anemia secondary to blood loss (chronic): Secondary | ICD-10-CM

## 2018-05-23 NOTE — Telephone Encounter (Signed)
Spoke with patient. Patient states her out of pocket cost for Brand LoSeasonique went from $0 to $125, patient requesting generic RX or covered alternative.   Advised patient Rx sent on 10/04/17 can be filled as generic or brand. Patient states she did not ask pharmacy about coverage or cost of generic. Patient will f/u with pharmacy to compare cost, if she needs an alternative will contact her insurance provider for alternatives on formulary and return call to office. Patient verbalizes understanding.  Routing to provider for final review. Patient is agreeable to disposition. Will close encounter.;

## 2018-05-23 NOTE — Telephone Encounter (Signed)
Patient would like to speak with nurse about her birth control prescription.

## 2018-08-01 ENCOUNTER — Ambulatory Visit: Payer: PRIVATE HEALTH INSURANCE | Admitting: Family

## 2018-08-01 ENCOUNTER — Other Ambulatory Visit: Payer: PRIVATE HEALTH INSURANCE

## 2018-08-06 ENCOUNTER — Telehealth: Payer: Self-pay

## 2018-08-06 NOTE — Telephone Encounter (Signed)
Copied from [place] [phone]. Topic: Appointment Scheduling - Scheduling Inquiry for Clinic >> Aug 06, 2018 10:01 AM Sheran Luz wrote: Reason for CRM: Patient would like to reschedule 5/14 appointment. Attempted to reach office x3.

## 2018-08-08 ENCOUNTER — Ambulatory Visit: Payer: 59 | Admitting: Internal Medicine

## 2018-08-14 ENCOUNTER — Telehealth: Payer: Self-pay | Admitting: Internal Medicine

## 2018-08-14 NOTE — Telephone Encounter (Signed)
Inform pt can stop weekly D3 and take D3 5000 IU daily over the counter   [place]

## 2018-08-14 NOTE — Telephone Encounter (Signed)
Left message giving details of medication/ or to call back if have any questions.  Zeffie Bickert,cma

## 2018-10-03 ENCOUNTER — Inpatient Hospital Stay (HOSPITAL_BASED_OUTPATIENT_CLINIC_OR_DEPARTMENT_OTHER): Payer: PRIVATE HEALTH INSURANCE | Admitting: Family

## 2018-10-03 ENCOUNTER — Telehealth: Payer: Self-pay | Admitting: Family

## 2018-10-03 ENCOUNTER — Other Ambulatory Visit: Payer: Self-pay

## 2018-10-03 ENCOUNTER — Encounter: Payer: Self-pay | Admitting: Family

## 2018-10-03 ENCOUNTER — Inpatient Hospital Stay: Payer: PRIVATE HEALTH INSURANCE | Attending: Family

## 2018-10-03 VITALS — BP 129/95 | HR 79 | Resp 16 | Wt 246.4 lb

## 2018-10-03 DIAGNOSIS — R002 Palpitations: Secondary | ICD-10-CM | POA: Insufficient documentation

## 2018-10-03 DIAGNOSIS — R0602 Shortness of breath: Secondary | ICD-10-CM | POA: Diagnosis not present

## 2018-10-03 DIAGNOSIS — D500 Iron deficiency anemia secondary to blood loss (chronic): Secondary | ICD-10-CM | POA: Insufficient documentation

## 2018-10-03 DIAGNOSIS — Z79899 Other long term (current) drug therapy: Secondary | ICD-10-CM | POA: Diagnosis not present

## 2018-10-03 DIAGNOSIS — D560 Alpha thalassemia: Secondary | ICD-10-CM

## 2018-10-03 DIAGNOSIS — N920 Excessive and frequent menstruation with regular cycle: Secondary | ICD-10-CM

## 2018-10-03 DIAGNOSIS — R5383 Other fatigue: Secondary | ICD-10-CM | POA: Insufficient documentation

## 2018-10-03 LAB — RETICULOCYTES
Immature Retic Fract: 12.7 % (ref 2.3–15.9)
RBC.: 4.69 MIL/uL (ref 3.87–5.11)
Retic Count, Absolute: 106.9 10*3/uL (ref 19.0–186.0)
Retic Ct Pct: 2.3 % (ref 0.4–3.1)

## 2018-10-03 LAB — CBC WITH DIFFERENTIAL (CANCER CENTER ONLY)
Abs Immature Granulocytes: 0.03 10*3/uL (ref 0.00–0.07)
Basophils Absolute: 0 10*3/uL (ref 0.0–0.1)
Basophils Relative: 0 %
Eosinophils Absolute: 0.1 10*3/uL (ref 0.0–0.5)
Eosinophils Relative: 3 %
HCT: 42 % (ref 36.0–46.0)
Hemoglobin: 14.5 g/dL (ref 12.0–15.0)
Immature Granulocytes: 1 %
Lymphocytes Relative: 39 %
Lymphs Abs: 1.6 10*3/uL (ref 0.7–4.0)
MCH: 30.8 pg (ref 26.0–34.0)
MCHC: 34.5 g/dL (ref 30.0–36.0)
MCV: 89.2 fL (ref 80.0–100.0)
Monocytes Absolute: 0.3 10*3/uL (ref 0.1–1.0)
Monocytes Relative: 8 %
Neutro Abs: 2 10*3/uL (ref 1.7–7.7)
Neutrophils Relative %: 49 %
Platelet Count: 248 10*3/uL (ref 150–400)
RBC: 4.71 MIL/uL (ref 3.87–5.11)
RDW: 12.4 % (ref 11.5–15.5)
WBC Count: 4.1 10*3/uL (ref 4.0–10.5)
nRBC: 0 % (ref 0.0–0.2)

## 2018-10-03 NOTE — Progress Notes (Signed)
Hematology and Oncology Follow Up Visit  Stacy Miles [id number] [birth year] 43 y.o. 10/03/2018   Principle Diagnosis:  Iron deficiency anemia secondary to menorrhagia  Current Therapy:   IV iron as indicated   Interim History:  Stacy Miles is here today for follow-up. She is doing well but has noted some mild fatigue. She attributes this to staying busy with work.  She has occasional SOB and palpitations with exertion.  No fever, chills, n/v, cough, rash, dizziness, chest pain, abdominal pain or changes in bowel or bladder habits.  She has had no episodes of bleeding, no bruising or petechiae.  She is still on birth control and no longer has a cycle. She occasionally spots lightly.  No swelling, tenderness, numbness or tingling in her extremities.  She is eating well and staying hydrated. Her weight is stable.   ECOG Performance Status: 1 - Symptomatic but completely ambulatory  Medications:  Allergies as of 10/03/2018   No Known Allergies     Medication List       Accurate as of October 03, 2018 11:09 AM. If you have any questions, ask your nurse or doctor.        acetaminophen 650 MG CR tablet Commonly known as: TYLENOL Take 650 mg by mouth every 8 (eight) hours as needed for pain or fever.   Cholecalciferol 1.25 MG (50000 UT) capsule Take 1 capsule (50,000 Units total) by mouth once a week.   folic acid 1 MG tablet Commonly known as: FOLVITE TAKE 1 TABLET BY MOUTH EVERY DAY   furosemide 20 MG tablet Commonly known as: LASIX Take 1 tablet (20 mg total) by mouth daily as needed.   ibuprofen 800 MG tablet Commonly known as: ADVIL TAKE 1 TABLET BY MOUTH EVERY 8 HOURS AS NEEDED   Levonorgestrel-Ethinyl Estradiol 0.1-0.02 & 0.01 MG tablet Commonly known as: LoSeasonique Take 1 tablet by mouth daily.   metroNIDAZOLE 500 MG tablet Commonly known as: FLAGYL Take 1 tablet (500 mg total) by mouth 2 (two) times daily.   multivitamin with minerals Tabs tablet Take 1  tablet by mouth daily.   phentermine 37.5 MG tablet Commonly known as: Adipex-P Take 1 tablet (37.5 mg total) by mouth daily before breakfast.       Allergies: No Known Allergies  Past Medical History, Surgical history, Social history, and Family History were reviewed and updated.  Review of Systems: All other 10 point review of systems is negative.   Physical Exam:  vitals were not taken for this visit.   Wt Readings from Last 3 Encounters:  04/11/18 236 lb 12.8 oz (107.4 kg)  03/07/18 230 lb 3.2 oz (104.4 kg)  12/13/17 225 lb (102.1 kg)    Ocular: Sclerae unicteric, pupils equal, round and reactive to light Ear-nose-throat: Oropharynx clear, dentition fair Lymphatic: No cervical or supraclavicular adenopathy Lungs no rales or rhonchi, good excursion bilaterally Heart regular rate and rhythm, no murmur appreciated Abd soft, nontender, positive bowel sounds, no liver or spleen tip palpated on exam, no fluid wave  MSK no focal spinal tenderness, no joint edema Neuro: non-focal, well-oriented, appropriate affect Breasts: Deferred   Lab Results  Component Value Date   WBC 4.1 10/03/2018   HGB 14.5 10/03/2018   HCT 42.0 10/03/2018   MCV 89.2 10/03/2018   PLT 248 10/03/2018   Lab Results  Component Value Date   FERRITIN 37 04/11/2018   IRON 95 04/11/2018   TIBC 379 04/11/2018   UIBC 284 04/11/2018   IRONPCTSAT 25 04/11/2018  Lab Results  Component Value Date   RETICCTPCT 2.3 10/03/2018   RBC 4.69 10/03/2018   RBC 4.71 10/03/2018   No results found for: KPAFRELGTCHN, LAMBDASER, KAPLAMBRATIO No results found for: IGGSERUM, IGA, IGMSERUM No results found for: Odetta Pink, SPEI   Chemistry      Component Value Date/Time   NA 137 06/21/2017 1134   NA 135 09/29/2016 0820   K 4.0 06/21/2017 1134   K 3.6 09/29/2016 0820   CL 106 06/21/2017 1134   CL 109 (H) 09/29/2016 0820   CO2 23 06/21/2017 1134   CO2  21 09/29/2016 0820   BUN 12 06/21/2017 1134   BUN 7 09/29/2016 0820   CREATININE 0.69 06/21/2017 1134   CREATININE 0.5 (L) 09/29/2016 0820      Component Value Date/Time   CALCIUM 9.1 06/21/2017 1134   CALCIUM 8.7 09/29/2016 0820   ALKPHOS 66 06/21/2017 1134   ALKPHOS 82 09/29/2016 0820   AST 18 06/21/2017 1134   AST 22 09/29/2016 0820   ALT 16 06/21/2017 1134   ALT 23 09/29/2016 0820   BILITOT 0.4 06/21/2017 1134   BILITOT 0.70 09/29/2016 0820       Impression and Plan: Stacy Miles is a very pleasant African American female with iron deficiency anemia. We will see what her iron studies show and bring her back in for infusion.  We will go ahead and plan to see her back in another 6 months.  She will contact our office with any questions or concerns. We can certainly see her sooner if needed.   Laverna Peace, NP 7/9/202011:09 AM

## 2018-10-03 NOTE — Telephone Encounter (Signed)
Appointments scheduled and I spoke with patient per 7/9 los

## 2018-10-04 LAB — IRON AND TIBC
Iron: 88 ug/dL (ref 41–142)
Saturation Ratios: 24 % (ref 21–57)
TIBC: 371 ug/dL (ref 236–444)
UIBC: 283 ug/dL (ref 120–384)

## 2018-10-04 LAB — FERRITIN: Ferritin: 68 ng/mL (ref 11–307)

## 2018-10-10 ENCOUNTER — Encounter: Payer: Self-pay | Admitting: Obstetrics and Gynecology

## 2018-10-10 ENCOUNTER — Other Ambulatory Visit: Payer: Self-pay

## 2018-10-10 ENCOUNTER — Ambulatory Visit: Payer: PRIVATE HEALTH INSURANCE | Admitting: Obstetrics and Gynecology

## 2018-10-10 VITALS — BP 130/82 | HR 80 | Temp 98.3°F | Ht 66.75 in | Wt 250.2 lb

## 2018-10-10 DIAGNOSIS — R350 Frequency of micturition: Secondary | ICD-10-CM | POA: Diagnosis not present

## 2018-10-10 DIAGNOSIS — Z862 Personal history of diseases of the blood and blood-forming organs and certain disorders involving the immune mechanism: Secondary | ICD-10-CM

## 2018-10-10 DIAGNOSIS — Z01419 Encounter for gynecological examination (general) (routine) without abnormal findings: Secondary | ICD-10-CM | POA: Diagnosis not present

## 2018-10-10 DIAGNOSIS — Z3041 Encounter for surveillance of contraceptive pills: Secondary | ICD-10-CM

## 2018-10-10 DIAGNOSIS — D259 Leiomyoma of uterus, unspecified: Secondary | ICD-10-CM

## 2018-10-10 DIAGNOSIS — Z8742 Personal history of other diseases of the female genital tract: Secondary | ICD-10-CM

## 2018-10-10 LAB — POCT URINALYSIS DIPSTICK
Bilirubin, UA: NEGATIVE
Blood, UA: POSITIVE
Glucose, UA: NEGATIVE
Ketones, UA: NEGATIVE
Leukocytes, UA: NEGATIVE
Nitrite, UA: NEGATIVE
Protein, UA: NEGATIVE
Spec Grav, UA: 1.01 (ref 1.010–1.025)
Urobilinogen, UA: 0.2 E.U./dL
pH, UA: 5 (ref 5.0–8.0)

## 2018-10-10 MED ORDER — IBUPROFEN 800 MG PO TABS: 800.0000 mg | ORAL_TABLET | Freq: Three times a day (TID) | ORAL | 1 refills | Status: DC | PRN

## 2018-10-10 MED ORDER — LEVONORGESTREL-ETHINYL ESTRAD 0.1-20 MG-MCG PO TABS: 1.0000 | ORAL_TABLET | Freq: Every day | ORAL | 3 refills | Status: DC

## 2018-10-10 NOTE — Progress Notes (Signed)
43 y.o. G0P0000 Single Black or African American Not Hispanic or Latino female here for annual exam.   H/O fibroid uterus (not deviating her cavity), dysmenorrhea, menorrhagia and severe anemia. No bleeding at all on OCP's. Still with intermittent cramping, tolerable.  Not sexually active. She is worried if the fibroids are growing, she feels bloated all the time.  She is on loseasonique, can't afford it any more. She has gained 26 lbs in the last year. Working 2 jobs, no time to exercise. Eating on the run.  She does have some urinary frequency, drinks lots of water. Mostly voids normal amounts, no urgency, no dysuria. Nocturia x 1    No LMP recorded. (Menstrual status: Oral contraceptives).          Sexually active: No.  The current method of family planning is OCP (estrogen/progesterone).    Exercising: No.  The patient does not participate in regular exercise at present. Smoker:  no  Health Maintenance: Pap:09/21/16 WNL neg HPV,10/2011 per patient normal [facility] History of abnormal Pap:no MMG:07/12/17 BI-RADS CATEGORY 1: Negative. Colonoscopy:n/a BMD:n/a TDaP:09/2015 Gardasil:No   reports that she has never smoked. She has never used smokeless tobacco. She reports that she does not drink alcohol or use drugs. 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   Past Medical History:  Diagnosis Date  . Anemia   . Depression   . Dysmenorrhea   . Fibroid   . History of blood transfusion 09/2016   [facility] - 2 units transfused  . Insomnia   . Iron deficiency anemia   . Stress     Past Surgical History:  Procedure Laterality Date  . DILATATION & CURETTAGE/HYSTEROSCOPY WITH MYOSURE N/A 11/13/2016   Procedure: DILATATION & CURETTAGE/HYSTEROSCOPY WITH MYOSURE;  Surgeon: Salvadore Dom, MD;  Location: [facility];  Service: Gynecology;  Laterality: N/A;  . DILATION AND CURETTAGE OF UTERUS    . OTHER SURGICAL HISTORY     cervical polyp removal      Current Outpatient Medications  Medication Sig Dispense Refill  . acetaminophen (TYLENOL) 650 MG CR tablet Take 650 mg by mouth every 8 (eight) hours as needed for pain or fever.    . Cholecalciferol 50000 units capsule Take 1 capsule (50,000 Units total) by mouth once a week. 13 capsule 1  . folic acid (FOLVITE) 1 MG tablet TAKE 1 TABLET BY MOUTH EVERY DAY 90 tablet 3  . furosemide (LASIX) 20 MG tablet Take 1 tablet (20 mg total) by mouth daily as needed. 30 tablet 11  . ibuprofen (ADVIL,MOTRIN) 800 MG tablet TAKE 1 TABLET BY MOUTH EVERY 8 HOURS AS NEEDED 30 tablet 1  . Levonorgestrel-Ethinyl Estradiol (LOSEASONIQUE) 0.1-0.02 & 0.01 MG tablet Take 1 tablet by mouth daily. 1 Package 4  . Multiple Vitamin (MULTIVITAMIN WITH MINERALS) TABS tablet Take 1 tablet by mouth daily.     No current facility-administered medications for this visit.     Family History  Problem Relation Age of Onset  . Diabetes Mother   . Hypertension Mother   . Arthritis Mother   . Asthma Mother   . COPD Father   . Breast cancer Maternal Grandmother        mat great gm    Review of Systems  Constitutional: Negative.   HENT: Negative.   Eyes: Negative.   Respiratory: Negative.   Cardiovascular: Negative.   Gastrointestinal: Negative.   Endocrine: Negative.   Genitourinary: Negative.   Musculoskeletal: Negative.  Skin: Negative.   Allergic/Immunologic: Negative.   Neurological: Negative.   Hematological: Negative.   Psychiatric/Behavioral: Negative.     Exam:   BP 130/82 (BP Location: Right Arm, Patient Position: Sitting, Cuff Size: Large)   Pulse 80   Temp 98.3 F (36.8 C) (Skin)   Ht 5' 6.75" (1.695 m)   Wt 250 lb 3.2 oz (113.5 kg)   BMI 39.48 kg/m   Weight change: @WEIGHTCHANGE @ Height:   Height: 5' 6.75" (169.5 cm)  Ht Readings from Last 3 Encounters:  10/10/18 5' 6.75" (1.695 m)  04/11/18 5' 6.75" (1.695 m)  03/07/18 5' 6.75" (1.695 m)    General appearance: alert, cooperative  and appears stated age Head: Normocephalic, without obvious abnormality, atraumatic Neck: no adenopathy, supple, symmetrical, trachea midline and thyroid normal to inspection and palpation Lungs: clear to auscultation bilaterally Cardiovascular: regular rate and rhythm Breasts: normal appearance, no masses or tenderness Abdomen: soft, non-tender; non distended,  no masses,  no organomegaly Extremities: extremities normal, atraumatic, no cyanosis or edema Skin: Skin color, texture, turgor normal. No rashes or lesions Lymph nodes: Cervical, supraclavicular, and axillary nodes normal. No abnormal inguinal nodes palpated Neurologic: Grossly normal   Pelvic: External genitalia:  no lesions              Urethra:  normal appearing urethra with no masses, tenderness or lesions              Bartholins and Skenes: normal                 Vagina: normal appearing vagina with normal color and discharge, no lesions. Small amount of blood noted in the vagina              Cervix: no lesions               Bimanual Exam:  Uterus:  exam limited by BMI, uterus not appreciably enlarged, not tender              Adnexa: no mass, fullness, tenderness               Rectovaginal: Confirms               Anus:  normal sphincter tone, no lesions  Chaperone was present for exam.  A:  Well Woman with normal exam  Fibroid uterus with h/o menorrhagia and anemia, controlled on OCP's  Intermittent cramping, typically at the end of the 3 month pack, tolerable  Weight gain  Urinary frequency, negative dip other than blood (spotting)  P:   No pap this year  Mammogram due, she will schedule  Will change to Alesses and take continuously (can't afford Loseasonique)  Labs with primary  Will work on weight loss  Ibuprofen prn pain

## 2018-10-10 NOTE — Patient Instructions (Signed)

## 2018-11-07 ENCOUNTER — Other Ambulatory Visit: Payer: Self-pay

## 2018-11-07 ENCOUNTER — Encounter: Payer: Self-pay | Admitting: Internal Medicine

## 2018-11-07 ENCOUNTER — Ambulatory Visit (INDEPENDENT_AMBULATORY_CARE_PROVIDER_SITE_OTHER): Payer: PRIVATE HEALTH INSURANCE | Admitting: Internal Medicine

## 2018-11-07 VITALS — Ht 66.0 in | Wt 239.0 lb

## 2018-11-07 DIAGNOSIS — E559 Vitamin D deficiency, unspecified: Secondary | ICD-10-CM | POA: Diagnosis not present

## 2018-11-07 DIAGNOSIS — Z0184 Encounter for antibody response examination: Secondary | ICD-10-CM | POA: Diagnosis not present

## 2018-11-07 DIAGNOSIS — J329 Chronic sinusitis, unspecified: Secondary | ICD-10-CM

## 2018-11-07 DIAGNOSIS — Z0000 Encounter for general adult medical examination without abnormal findings: Secondary | ICD-10-CM | POA: Insufficient documentation

## 2018-11-07 DIAGNOSIS — J309 Allergic rhinitis, unspecified: Secondary | ICD-10-CM

## 2018-11-07 DIAGNOSIS — R0982 Postnasal drip: Secondary | ICD-10-CM

## 2018-11-07 DIAGNOSIS — Z13220 Encounter for screening for lipoid disorders: Secondary | ICD-10-CM

## 2018-11-07 DIAGNOSIS — Z1159 Encounter for screening for other viral diseases: Secondary | ICD-10-CM

## 2018-11-07 DIAGNOSIS — E669 Obesity, unspecified: Secondary | ICD-10-CM

## 2018-11-07 DIAGNOSIS — Z1329 Encounter for screening for other suspected endocrine disorder: Secondary | ICD-10-CM

## 2018-11-07 MED ORDER — AZITHROMYCIN 250 MG PO TABS: ORAL_TABLET | ORAL | 0 refills | Status: DC

## 2018-11-07 MED ORDER — LORATADINE 10 MG PO TABS: 10.0000 mg | ORAL_TABLET | Freq: Every day | ORAL | 3 refills | Status: DC | PRN

## 2018-11-07 MED ORDER — IPRATROPIUM BROMIDE 0.06 % NA SOLN: 2.0000 | Freq: Three times a day (TID) | NASAL | 12 refills | Status: DC

## 2018-11-07 MED ORDER — MUCINEX DM MAXIMUM STRENGTH 60-1200 MG PO TB12: 1.0000 | ORAL_TABLET | Freq: Two times a day (BID) | ORAL | 0 refills | Status: DC | PRN

## 2018-11-07 NOTE — Progress Notes (Signed)
Telephone Note I connected with Stacy Miles  on 11/07/18 at 11:00 AM EDT by telephone and verified that I am speaking with the correct person using two identifiers.  Location patient: home Location provider:work or home office Persons participating in the virtual visit: patient, provider  I discussed the limitations of evaluation and management by telemedicine and the availability of in person appointments. The patient expressed understanding and agreed to proceed.   HPI: 1. Obesity gained wt with reduced exercise and eating muffins, little debbie, juice and candy working so many hrs on her feet to tired to exercise  She has adipex but has not taken it due to not exercising or eating right but has 2 bottles  2. C/o post nasal drip, cough at night, mucous, runny nose since 05/2018 no sx's of COVID 19 she notices when out side with her dogs  Also c/o sinus pressure   ROS: See pertinent positives and negatives per HPI. General wt gain  HEENT: post nasal drip runny nose at times  CV: no chest pain  Lungs: no sob +cough  GI: no ab pain  GU: no issues +fibroids  MSK: no jt pain  Neuro: no h/a Skin no issues  Psych: h/o stress depression did not f/u with SEL group therapy pt will check with insurance    Past Medical History:  Diagnosis Date  . Anemia   . Depression   . Dysmenorrhea   . Fibroid   . History of blood transfusion 09/2016   [facility] - 2 units transfused  . Insomnia   . Iron deficiency anemia   . Stress     Past Surgical History:  Procedure Laterality Date  . DILATATION & CURETTAGE/HYSTEROSCOPY WITH MYOSURE N/A 11/13/2016   Procedure: DILATATION & CURETTAGE/HYSTEROSCOPY WITH MYOSURE;  Surgeon: Salvadore Dom, MD;  Location: [facility];  Service: Gynecology;  Laterality: N/A;  . DILATION AND CURETTAGE OF UTERUS    . OTHER SURGICAL HISTORY     cervical polyp removal     Family History  Problem Relation Age of Onset  . Diabetes Mother   . Hypertension Mother   .  Arthritis Mother   . Asthma Mother   . COPD Father   . Breast cancer Maternal Grandmother        mat great gm    SOCIAL HX:   In school for criminal justice Works full and part time job  Masters degree, Freight forwarder  No kids  Lives in [place]  No guns  Wears seat belt  Safe in relationship   Current Outpatient Medications:  .  acetaminophen (TYLENOL) 650 MG CR tablet, Take 650 mg by mouth every 8 (eight) hours as needed for pain or fever., Disp: , Rfl:  .  Cholecalciferol 50000 units capsule, Take 1 capsule (50,000 Units total) by mouth once a week., Disp: 13 capsule, Rfl: 1 .  folic acid (FOLVITE) 1 MG tablet, TAKE 1 TABLET BY MOUTH EVERY DAY, Disp: 90 tablet, Rfl: 3 .  furosemide (LASIX) 20 MG tablet, Take 1 tablet (20 mg total) by mouth daily as needed., Disp: 30 tablet, Rfl: 11 .  ibuprofen (ADVIL) 800 MG tablet, Take 1 tablet (800 mg total) by mouth every 8 (eight) hours as needed., Disp: 30 tablet, Rfl: 1 .  levonorgestrel-ethinyl estradiol (ALESSE) 0.1-20 MG-MCG tablet, Take 1 tablet by mouth daily. Skip placebo week, take continuously., Disp: 4 Package, Rfl: 3 .  loratadine (CLARITIN) 10 MG tablet, Take 1 tablet (10 mg total) by mouth daily as  needed for allergies., Disp: 90 tablet, Rfl: 3 .  Multiple Vitamin (MULTIVITAMIN WITH MINERALS) TABS tablet, Take 1 tablet by mouth daily., Disp: , Rfl:  .  azithromycin (ZITHROMAX) 250 MG tablet, 2 pills day 1 and 1 pill day 2-5, Disp: 6 tablet, Rfl: 0 .  Dextromethorphan-guaiFENesin (MUCINEX DM MAXIMUM STRENGTH) 60-1200 MG TB12, Take 1 tablet by mouth 2 (two) times daily as needed., Disp: 30 tablet, Rfl: 0 .  ipratropium (ATROVENT) 0.06 % nasal spray, Place 2 sprays into both nostrils 3 (three) times daily., Disp: 15 mL, Rfl: 12  EXAM:  VITALS per patient if applicable:  GENERAL: alert, oriented, appears well and in no acute distress  PSYCH/NEURO: pleasant and cooperative, no obvious depression or anxiety, speech and thought processing  grossly intact  ASSESSMENT AND PLAN:  Discussed the following assessment and plan:  Sinusitis, unspecified chronicity, unspecified location - Plan: azithromycin (ZITHROMAX) 250 MG tablet and see below   Allergic rhinitis, unspecified seasonality, unspecified trigger - Plan: ipratropium (ATROVENT) 0.06 % nasal spray, loratadine (CLARITIN) 10 MG tablet, Dextromethorphan-guaiFENesin (MUCINEX DM MAXIMUM STRENGTH) 60-1200 MG TB12 bid prn   PND (post-nasal drip) - Plan: loratadine (CLARITIN) 10 MG tablet, Dextromethorphan-guaiFENesin (MUCINEX DM MAXIMUM STRENGTH) 60-1200 MG TB12  Obesity  -rec healthy exercise and diet  The next 56 days  Has adipex 2 bottles but not taking due to not currenly working out   HM-annual next in person visit w/in 6 months  Declines flu shot  Tdaputd Hep B vaccine3/3today check titer  Declines HIV check   LMP 03/20/17 has then q3 months with OCP. Pap neg OB/GYN 09/26/16 neg HPV Mammogram neg 4/18/19ordered repeat pt to call to schedule given # today  Vit D def recheck vitamin D Pt to check with insurance if they cover SEL therapy     I discussed the assessment and treatment plan with the patient. The patient was provided an opportunity to ask questions and all were answered. The patient agreed with the plan and demonstrated an understanding of the instructions.   The patient was advised to call back or seek an in-person evaluation if the symptoms worsen or if the condition fails to improve as anticipated.  Time spent 20 minutes  Delorise Jackson, MD

## 2018-11-11 ENCOUNTER — Other Ambulatory Visit: Payer: Self-pay

## 2018-11-11 ENCOUNTER — Other Ambulatory Visit (INDEPENDENT_AMBULATORY_CARE_PROVIDER_SITE_OTHER): Payer: PRIVATE HEALTH INSURANCE

## 2018-11-11 DIAGNOSIS — Z13220 Encounter for screening for lipoid disorders: Secondary | ICD-10-CM | POA: Diagnosis not present

## 2018-11-11 DIAGNOSIS — Z0000 Encounter for general adult medical examination without abnormal findings: Secondary | ICD-10-CM

## 2018-11-11 DIAGNOSIS — Z0184 Encounter for antibody response examination: Secondary | ICD-10-CM

## 2018-11-11 DIAGNOSIS — Z1159 Encounter for screening for other viral diseases: Secondary | ICD-10-CM

## 2018-11-11 DIAGNOSIS — Z1329 Encounter for screening for other suspected endocrine disorder: Secondary | ICD-10-CM

## 2018-11-11 DIAGNOSIS — E559 Vitamin D deficiency, unspecified: Secondary | ICD-10-CM | POA: Diagnosis not present

## 2018-11-11 LAB — COMPREHENSIVE METABOLIC PANEL
ALT: 13 U/L (ref 0–35)
AST: 16 U/L (ref 0–37)
Albumin: 3.9 g/dL (ref 3.5–5.2)
Alkaline Phosphatase: 77 U/L (ref 39–117)
BUN: 16 mg/dL (ref 6–23)
CO2: 23 mEq/L (ref 19–32)
Calcium: 9.2 mg/dL (ref 8.4–10.5)
Chloride: 105 mEq/L (ref 96–112)
Creatinine, Ser: 0.73 mg/dL (ref 0.40–1.20)
GFR: 105.79 mL/min (ref >60.00)
Glucose, Bld: 98 mg/dL (ref 70–99)
Potassium: 4 mEq/L (ref 3.5–5.1)
Sodium: 138 mEq/L (ref 135–145)
Total Bilirubin: 0.4 mg/dL (ref 0.2–1.2)
Total Protein: 6.8 g/dL (ref 6.0–8.3)

## 2018-11-11 LAB — LIPID PANEL
Cholesterol: 193 mg/dL (ref 0–200)
HDL: 70.5 mg/dL (ref >39.00)
LDL Cholesterol: 108 mg/dL — ABNORMAL HIGH (ref 0–99)
NonHDL: 122.8
Total CHOL/HDL Ratio: 3
Triglycerides: 75 mg/dL (ref 0.0–149.0)
VLDL: 15 mg/dL (ref 0.0–40.0)

## 2018-11-11 LAB — TSH: TSH: 1.28 u[IU]/mL (ref 0.35–4.50)

## 2018-11-11 LAB — VITAMIN D 25 HYDROXY (VIT D DEFICIENCY, FRACTURES): VITD: 37.31 ng/mL (ref 30.00–100.00)

## 2018-11-13 LAB — HEPATITIS B SURFACE ANTIBODY, QUANTITATIVE: Hep B S AB Quant (Post): 342 m[IU]/mL (ref >=10)

## 2018-12-19 ENCOUNTER — Ambulatory Visit
Admission: RE | Admit: 2018-12-19 | Discharge: 2018-12-19 | Disposition: A | Discharge status: Home / Self Care | Payer: PRIVATE HEALTH INSURANCE | Source: Ambulatory Visit | Attending: Internal Medicine | Admitting: Internal Medicine

## 2018-12-19 DIAGNOSIS — Z1231 Encounter for screening mammogram for malignant neoplasm of breast: Secondary | ICD-10-CM | POA: Insufficient documentation

## 2019-04-03 ENCOUNTER — Inpatient Hospital Stay: Payer: PRIVATE HEALTH INSURANCE

## 2019-04-03 ENCOUNTER — Inpatient Hospital Stay: Payer: PRIVATE HEALTH INSURANCE | Admitting: Family

## 2019-04-04 ENCOUNTER — Ambulatory Visit: Payer: PRIVATE HEALTH INSURANCE | Admitting: Family

## 2019-04-04 ENCOUNTER — Other Ambulatory Visit: Payer: PRIVATE HEALTH INSURANCE

## 2019-04-07 ENCOUNTER — Other Ambulatory Visit: Payer: Self-pay | Admitting: Family

## 2019-04-08 ENCOUNTER — Other Ambulatory Visit: Payer: Self-pay | Admitting: Family

## 2019-05-01 ENCOUNTER — Encounter: Payer: Self-pay | Admitting: Family

## 2019-05-01 ENCOUNTER — Other Ambulatory Visit: Payer: Self-pay

## 2019-05-01 ENCOUNTER — Telehealth: Payer: Self-pay | Admitting: Family

## 2019-05-01 ENCOUNTER — Inpatient Hospital Stay: Payer: PRIVATE HEALTH INSURANCE | Attending: Family

## 2019-05-01 ENCOUNTER — Inpatient Hospital Stay (HOSPITAL_BASED_OUTPATIENT_CLINIC_OR_DEPARTMENT_OTHER): Payer: PRIVATE HEALTH INSURANCE | Admitting: Family

## 2019-05-01 VITALS — BP 137/84 | HR 86 | Temp 97.5°F | Resp 18 | Ht 66.0 in | Wt 253.4 lb

## 2019-05-01 DIAGNOSIS — D500 Iron deficiency anemia secondary to blood loss (chronic): Secondary | ICD-10-CM

## 2019-05-01 DIAGNOSIS — N920 Excessive and frequent menstruation with regular cycle: Secondary | ICD-10-CM | POA: Insufficient documentation

## 2019-05-01 LAB — CBC WITH DIFFERENTIAL (CANCER CENTER ONLY)
Abs Immature Granulocytes: 0.04 10*3/uL (ref 0.00–0.07)
Basophils Absolute: 0 10*3/uL (ref 0.0–0.1)
Basophils Relative: 1 %
Eosinophils Absolute: 0.1 10*3/uL (ref 0.0–0.5)
Eosinophils Relative: 1 %
HCT: 40.7 % (ref 36.0–46.0)
Hemoglobin: 13.9 g/dL (ref 12.0–15.0)
Immature Granulocytes: 1 %
Lymphocytes Relative: 40 %
Lymphs Abs: 1.6 10*3/uL (ref 0.7–4.0)
MCH: 30.2 pg (ref 26.0–34.0)
MCHC: 34.2 g/dL (ref 30.0–36.0)
MCV: 88.5 fL (ref 80.0–100.0)
Monocytes Absolute: 0.3 10*3/uL (ref 0.1–1.0)
Monocytes Relative: 8 %
Neutro Abs: 2 10*3/uL (ref 1.7–7.7)
Neutrophils Relative %: 49 %
Platelet Count: 259 10*3/uL (ref 150–400)
RBC: 4.6 MIL/uL (ref 3.87–5.11)
RDW: 12.6 % (ref 11.5–15.5)
WBC Count: 4.1 10*3/uL (ref 4.0–10.5)
nRBC: 0 % (ref 0.0–0.2)

## 2019-05-01 LAB — RETICULOCYTES
Immature Retic Fract: 10.1 % (ref 2.3–15.9)
RBC.: 4.62 MIL/uL (ref 3.87–5.11)
Retic Count, Absolute: 90.1 10*3/uL (ref 19.0–186.0)
Retic Ct Pct: 2 % (ref 0.4–3.1)

## 2019-05-01 NOTE — Telephone Encounter (Signed)
Appointments scheduled calendar printed per 2/4 los

## 2019-05-01 NOTE — Progress Notes (Signed)
Hematology and Oncology Follow Up Visit  Stacy Miles [id number] May 03, 1975 44 y.o. 05/01/2019   Principle Diagnosis:  Iron deficiency anemia secondary to menorrhagia  Current Therapy:   IV iron as indicated   Interim History:  Stacy Miles is here today for follow-up. She is doing well. She is on birthcontrol and now only lightly spots once a month. No other blood loss noted. No bruising or petechiae.  She states that she does feel that she some puffiness in her hands and feet at times and is not sure if this is due to the birth control. She has an appointment soon with her PCP and plans to discuss with them.  No fever, chills, n/v, cough, rash, dizziness, SOB, chest pain, palpitations, abdominal pain or changes in bowel or bladder habits.  No tenderness, numbness or tingling in her extremities at this time.  No falls or syncopal episodes to report.  She has maintained a good appetite and is staying well hydrated. Her weight is stable.   ECOG Performance Status: 0 - Asymptomatic  Medications:  Allergies as of 05/01/2019   No Known Allergies     Medication List       Accurate as of May 01, 2019 10:28 AM. If you have any questions, ask your nurse or doctor.        acetaminophen 650 MG CR tablet Commonly known as: TYLENOL Take 650 mg by mouth every 8 (eight) hours as needed for pain or fever.   azithromycin 250 MG tablet Commonly known as: ZITHROMAX 2 pills day 1 and 1 pill day 2-5   Cholecalciferol 1.25 MG (50000 UT) capsule Take 1 capsule (50,000 Units total) by mouth once a week.   folic acid 1 MG tablet Commonly known as: FOLVITE TAKE 1 TABLET BY MOUTH EVERY DAY   furosemide 20 MG tablet Commonly known as: LASIX Take 1 tablet (20 mg total) by mouth daily as needed.   ibuprofen 800 MG tablet Commonly known as: ADVIL Take 1 tablet (800 mg total) by mouth every 8 (eight) hours as needed.   ipratropium 0.06 % nasal spray Commonly known as: ATROVENT Place 2  sprays into both nostrils 3 (three) times daily.   levonorgestrel-ethinyl estradiol 0.1-20 MG-MCG tablet Commonly known as: ALESSE Take 1 tablet by mouth daily. Skip placebo week, take continuously.   loratadine 10 MG tablet Commonly known as: CLARITIN Take 1 tablet (10 mg total) by mouth daily as needed for allergies.   Mucinex DM Maximum Strength 60-1200 MG Tb12 Take 1 tablet by mouth 2 (two) times daily as needed.   multivitamin with minerals Tabs tablet Take 1 tablet by mouth daily.       Allergies: No Known Allergies  Past Medical History, Surgical history, Social history, and Family History were reviewed and updated.  Review of Systems: All other 10 point review of systems is negative.   Physical Exam:  vitals were not taken for this visit.   Wt Readings from Last 3 Encounters:  11/07/18 239 lb (108.4 kg)  10/10/18 250 lb 3.2 oz (113.5 kg)  10/03/18 246 lb 6.4 oz (111.8 kg)    Ocular: Sclerae unicteric, pupils equal, round and reactive to light Ear-nose-throat: Oropharynx clear, dentition fair Lymphatic: No cervical or supraclavicular adenopathy Lungs no rales or rhonchi, good excursion bilaterally Heart regular rate and rhythm, no murmur appreciated Abd soft, nontender, positive bowel sounds, no liver or spleen tip palpated on exam, no fluid wave  MSK no focal spinal tenderness, no joint edema  Neuro: non-focal, well-oriented, appropriate affect Breasts: Deferred   Lab Results  Component Value Date   WBC 4.1 10/03/2018   HGB 14.5 10/03/2018   HCT 42.0 10/03/2018   MCV 89.2 10/03/2018   PLT 248 10/03/2018   Lab Results  Component Value Date   FERRITIN 68 10/03/2018   IRON 88 10/03/2018   TIBC 371 10/03/2018   UIBC 283 10/03/2018   IRONPCTSAT 24 10/03/2018   Lab Results  Component Value Date   RETICCTPCT 2.3 10/03/2018   RBC 4.69 10/03/2018   RBC 4.71 10/03/2018   No results found for: KPAFRELGTCHN, LAMBDASER, KAPLAMBRATIO No results found for:  IGGSERUM, IGA, IGMSERUM No results found for: Odetta Pink, SPEI   Chemistry      Component Value Date/Time   NA 138 11/11/2018 0822   NA 135 09/29/2016 0820   K 4.0 11/11/2018 0822   K 3.6 09/29/2016 0820   CL 105 11/11/2018 0822   CL 109 (H) 09/29/2016 0820   CO2 23 11/11/2018 0822   CO2 21 09/29/2016 0820   BUN 16 11/11/2018 0822   BUN 7 09/29/2016 0820   CREATININE 0.73 11/11/2018 0822   CREATININE 0.5 (L) 09/29/2016 0820      Component Value Date/Time   CALCIUM 9.2 11/11/2018 0822   CALCIUM 8.7 09/29/2016 0820   ALKPHOS 77 11/11/2018 0822   ALKPHOS 82 09/29/2016 0820   AST 16 11/11/2018 0822   AST 22 09/29/2016 0820   ALT 13 11/11/2018 0822   ALT 23 09/29/2016 0820   BILITOT 0.4 11/11/2018 0822   BILITOT 0.70 09/29/2016 0820       Impression and Plan: Stacy Miles is a very pleasant African American female with iron deficiency anemia. Iron studies are pending. We will replace if needed.  We will see her back in anohter 6 months.  She will contact our office with any questions or concerns. We can certainly see her sooner if needed.   Laverna Peace, NP 2/4/202110:28 AM

## 2019-05-02 LAB — IRON AND TIBC
Iron: 80 ug/dL (ref 41–142)
Saturation Ratios: 23 % (ref 21–57)
TIBC: 353 ug/dL (ref 236–444)
UIBC: 273 ug/dL (ref 120–384)

## 2019-05-02 LAB — FERRITIN: Ferritin: 98 ng/mL (ref 11–307)

## 2019-05-13 ENCOUNTER — Ambulatory Visit: Payer: PRIVATE HEALTH INSURANCE | Admitting: Internal Medicine

## 2019-06-26 ENCOUNTER — Ambulatory Visit: Payer: PRIVATE HEALTH INSURANCE | Admitting: Internal Medicine

## 2019-06-26 ENCOUNTER — Encounter: Payer: Self-pay | Admitting: Obstetrics and Gynecology

## 2019-06-26 ENCOUNTER — Other Ambulatory Visit: Payer: Self-pay

## 2019-06-26 ENCOUNTER — Encounter: Payer: Self-pay | Admitting: Internal Medicine

## 2019-06-26 VITALS — BP 130/86 | HR 84 | Temp 97.0°F | Ht 66.0 in | Wt 252.0 lb

## 2019-06-26 DIAGNOSIS — Z13220 Encounter for screening for lipoid disorders: Secondary | ICD-10-CM

## 2019-06-26 DIAGNOSIS — E559 Vitamin D deficiency, unspecified: Secondary | ICD-10-CM

## 2019-06-26 DIAGNOSIS — Z0000 Encounter for general adult medical examination without abnormal findings: Secondary | ICD-10-CM | POA: Diagnosis not present

## 2019-06-26 DIAGNOSIS — F439 Reaction to severe stress, unspecified: Secondary | ICD-10-CM

## 2019-06-26 DIAGNOSIS — Z1389 Encounter for screening for other disorder: Secondary | ICD-10-CM

## 2019-06-26 DIAGNOSIS — D500 Iron deficiency anemia secondary to blood loss (chronic): Secondary | ICD-10-CM

## 2019-06-26 DIAGNOSIS — R609 Edema, unspecified: Secondary | ICD-10-CM | POA: Diagnosis not present

## 2019-06-26 DIAGNOSIS — Z1231 Encounter for screening mammogram for malignant neoplasm of breast: Secondary | ICD-10-CM

## 2019-06-26 DIAGNOSIS — Z1329 Encounter for screening for other suspected endocrine disorder: Secondary | ICD-10-CM

## 2019-06-26 MED ORDER — FUROSEMIDE 20 MG PO TABS: 20.0000 mg | ORAL_TABLET | Freq: Every day | ORAL | 11 refills | Status: DC | PRN

## 2019-06-26 NOTE — Progress Notes (Signed)
Chief Complaint  Patient presents with  . Follow-up   Annual doing well except for weight  1. IDA f/u upcoming with oncology blood cts improved  2. Due for pap ob/gyn scheduled 10/2019  3. Stress due to working 2 jobs full time and part time  4. Morbid obesity eating late a night and not currently exercising due to work schedule   Review of Systems  Constitutional: Negative for weight loss.  HENT: Negative for hearing loss.   Eyes: Negative for blurred vision.  Respiratory: Negative for shortness of breath.   Cardiovascular: Negative for chest pain.  Gastrointestinal: Negative for abdominal pain.  Musculoskeletal: Negative for falls.  Skin: Negative for rash.  Neurological: Negative for headaches.  Psychiatric/Behavioral:       +stress     Past Medical History:  Diagnosis Date  . Anemia   . Depression   . Dysmenorrhea   . Fibroid   . History of blood transfusion 09/2016   [facility] - 2 units transfused  . Insomnia   . Iron deficiency anemia   . Stress    Past Surgical History:  Procedure Laterality Date  . DILATATION & CURETTAGE/HYSTEROSCOPY WITH MYOSURE N/A 11/13/2016   Procedure: DILATATION & CURETTAGE/HYSTEROSCOPY WITH MYOSURE;  Surgeon: Salvadore Dom, MD;  Location: [facility];  Service: Gynecology;  Laterality: N/A;  . DILATION AND CURETTAGE OF UTERUS    . OTHER SURGICAL HISTORY     cervical polyp removal    Family History  Problem Relation Age of Onset  . Diabetes Mother   . Hypertension Mother   . Arthritis Mother   . Asthma Mother   . COPD Father   . Breast cancer Maternal Grandmother        mat great gm   Social History   Socioeconomic History  . Marital status: Single    Spouse name: Not on file  . Number of children: Not on file  . Years of education: Not on file  . Highest education level: Not on file  Occupational History  . Not on file  Tobacco Use  . Smoking status: Never Smoker  . Smokeless tobacco: Never Used  Substance and Sexual Activity   . Alcohol use: No  . Drug use: No  . Sexual activity: Not Currently    Birth control/protection: Pill  Other Topics Concern  . 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 Difficulty of Paying Living Expenses:   Food Insecurity:   . Worried About Charity fundraiser in the Last Year:   . Arboriculturist in the Last Year:   Transportation Needs:   . Film/video editor (Medical):   Marland Kitchen Lack of Transportation (Non-Medical):   Physical Activity:   . Days of Exercise per Week:   . Minutes of Exercise per Session:   Stress:   . Feeling of Stress :   Social Connections:   . Frequency of Communication with Friends and Family:   . Frequency of Social Gatherings with Friends and Family:   .  Attends Religious Services:   . Active Member of Clubs or Organizations:   . Attends Archivist Meetings:   Marland Kitchen Marital Status:   Intimate Partner Violence:   . Fear of Current or Ex-Partner:   . Emotionally Abused:   Marland Kitchen Physically Abused:   . Sexually Abused:    Current Meds  Medication Sig  . acetaminophen (TYLENOL) 650 MG CR tablet Take 650 mg by mouth every 8 (eight) hours as needed for pain or fever.  . Cholecalciferol (VITAMIN D3) 125 MCG (5000 UT) TABS Take by mouth daily.  . folic acid (FOLVITE) 1 MG tablet TAKE 1 TABLET BY MOUTH EVERY DAY  . furosemide (LASIX) 20 MG tablet Take 1 tablet (20 mg total) by mouth daily as needed.  Marland Kitchen ibuprofen (ADVIL) 800 MG tablet Take 1 tablet (800 mg total) by mouth every 8 (eight) hours as needed.  Marland Kitchen levonorgestrel-ethinyl estradiol (ALESSE) 0.1-20 MG-MCG tablet Take 1 tablet by mouth daily. Skip  placebo week, take continuously.  . [DISCONTINUED] Cholecalciferol 50000 units capsule Take 1 capsule (50,000 Units total) by mouth once a week.  . [DISCONTINUED] furosemide (LASIX) 20 MG tablet Take 1 tablet (20 mg total) by mouth daily as needed.   No Known Allergies Recent Results (from the past 2160 hour(s))  CBC with Differential (Cancer Center Only)     Status: None   Collection Time: 05/01/19 10:13 AM  Result Value Ref Range   WBC Count 4.1 4.0 - 10.5 K/uL   RBC 4.60 3.87 - 5.11 MIL/uL   Hemoglobin 13.9 12.0 - 15.0 g/dL   HCT 40.7 36.0 - 46.0 %   MCV 88.5 80.0 - 100.0 fL   MCH 30.2 26.0 - 34.0 pg   MCHC 34.2 30.0 - 36.0 g/dL   RDW 12.6 11.5 - 15.5 %   Platelet Count 259 150 - 400 K/uL   nRBC 0.0 0.0 - 0.2 %   Neutrophils Relative % 49 %   Neutro Abs 2.0 1.7 - 7.7 K/uL   Lymphocytes Relative 40 %   Lymphs Abs 1.6 0.7 - 4.0 K/uL   Monocytes Relative 8 %   Monocytes Absolute 0.3 0.1 - 1.0 K/uL   Eosinophils Relative 1 %   Eosinophils Absolute 0.1 0.0 - 0.5 K/uL   Basophils Relative 1 %   Basophils Absolute 0.0 0.0 - 0.1 K/uL   Immature Granulocytes 1 %   Abs Immature Granulocytes 0.04 0.00 - 0.07 K/uL    Comment: Performed at [facility] at [facility], [address] [place], [place] [address]  Reticulocytes     Status: None   Collection Time: 05/01/19 10:13 AM  Result Value Ref Range   Retic Ct Pct 2.0 0.4 - 3.1 %   RBC. 4.62 3.87 - 5.11 MIL/uL   Retic Count, Absolute 90.1 19.0 - 186.0 K/uL   Immature Retic Fract 10.1 2.3 - 15.9 %    Comment: Performed at [facility] at [facility], [address], [place], [place] [address]  Iron and TIBC     Status: None   Collection Time: 05/01/19 10:13 AM  Result Value Ref Range   Iron 80 41 - 142 ug/dL   TIBC 353 236 - 444 ug/dL   Saturation Ratios 23 21 - 57 %   UIBC 273 120 - 384 ug/dL    Comment: Performed at [facility], [place] [address] [place],  [address]  Ferritin  Status: None   Collection Time: 05/01/19 10:13 AM  Result Value Ref Range   Ferritin 98 11 - 307 ng/mL    Comment: Performed at [facility], [place] [address], [place], [place] [address]   Objective  Body mass index is 40.67 kg/m. Wt Readings from Last 3 Encounters:  06/26/19 252 lb (114.3 kg)  05/01/19 253 lb 6.4 oz (114.9 kg)  11/07/18 239 lb (108.4 kg)   Temp Readings from Last 3 Encounters:  06/26/19 (!) 97 F (36.1 C) (Temporal)  05/01/19 (!) 97.5 F (36.4 C) (Temporal)  10/10/18 98.3 F (36.8 C) (Skin)   BP Readings from Last 3 Encounters:  06/26/19 130/86  05/01/19 137/84  10/10/18 130/82   Pulse Readings from Last 3 Encounters:  06/26/19 84  05/01/19 86  10/10/18 80    Physical Exam Vitals and nursing note reviewed.  Constitutional:      Appearance: Normal appearance. She is well-developed and well-groomed. She is morbidly obese.  HENT:     Head: Normocephalic and atraumatic.  Eyes:     Conjunctiva/sclera: Conjunctivae normal.     Pupils: Pupils are equal, round, and reactive to light.  Cardiovascular:     Rate and Rhythm: Normal rate and regular rhythm.     Heart sounds: Normal heart sounds. No murmur.  Pulmonary:     Effort: Pulmonary effort is normal.     Breath sounds: Normal breath sounds.  Abdominal:     General: Abdomen is flat. Bowel sounds are normal.  Skin:    General: Skin is warm and dry.  Neurological:     General: No focal deficit present.     Mental Status: She is alert and oriented to person, place, and time. Mental status is at baseline.     Gait: Gait normal.  Psychiatric:        Attention and Perception: Attention and perception normal.        Mood and Affect: Mood and affect normal.        Speech: Speech normal.        Behavior: Behavior normal. Behavior is cooperative.        Thought Content: Thought content normal.        Cognition and Memory: Cognition and  memory normal.        Judgment: Judgment normal.     Assessment  Plan  Annual physical exam Declines flu shot  Tdaputd ? If will get covid vaccine Hep B vaccine immune  Declines HIV check   09/21/16 pap neg neg HPV ob/gyn sch 10/2019   Mammogram neg 9/24/20ordered  for 2021   Colonoscopy consider age 32   Vit D def recheck vitamin D on D3 5000 IU qd   rec healthy diet and exercise     Edema, unspecified type - Plan: furosemide (LASIX) 20 MG tablet  Stress rec exercise  Morbid obesity ([place]) -rec healthy diet and exercise  Will refill adipex if does not have enough in the future  Iron deficiency anemia due to chronic blood loss F/u h/o upcoming  Vitamin D deficiency  On D3 5000 IU qd otc repeat labs 10/2019  Provider: Dr. Olivia Mackie McLean-Scocuzza-Internal Medicine

## 2019-06-26 NOTE — Patient Instructions (Addendum)
Premier protein shake   The next 56 days online nutrition  Pap due 09/22/19 with ob/gyn  Exercising to Lose Weight Exercise is structured, repetitive physical activity to improve fitness and health. Getting regular exercise is important for everyone. It is especially important if you are overweight. Being overweight increases your risk of heart disease, stroke, diabetes, high blood pressure, and several types of cancer. Reducing your calorie intake and exercising can help you lose weight. Exercise is usually categorized as moderate or vigorous intensity. To lose weight, most people need to do a certain amount of moderate-intensity or vigorous-intensity exercise each week. Moderate-intensity exercise  Moderate-intensity exercise is any activity that gets you moving enough to burn at least three times more energy (calories) than if you were sitting. Examples of moderate exercise include:  Walking a mile in 15 minutes.  Doing light yard work.  Biking at an easy pace. Most people should get at least 150 minutes (2 hours and 30 minutes) a week of moderate-intensity exercise to maintain their body weight. Vigorous-intensity exercise Vigorous-intensity exercise is any activity that gets you moving enough to burn at least six times more calories than if you were sitting. When you exercise at this intensity, you should be working hard enough that you are not able to carry on a conversation. Examples of vigorous exercise include:  Running.  Playing a team sport, such as football, basketball, and soccer.  Jumping rope. Most people should get at least 75 minutes (1 hour and 15 minutes) a week of vigorous-intensity exercise to maintain their body weight. How can exercise affect me? When you exercise enough to burn more calories than you eat, you lose weight. Exercise also reduces body fat and builds muscle. The more muscle you have, the more calories you burn. Exercise also:  Improves  mood.  Reduces stress and tension.  Improves your overall fitness, flexibility, and endurance.  Increases bone strength. The amount of exercise you need to lose weight depends on:  Your age.  The type of exercise.  Any health conditions you have.  Your overall physical ability. Talk to your health care provider about how much exercise you need and what types of activities are safe for you. What actions can I take to lose weight? Nutrition   Make changes to your diet as told by your health care provider or diet and nutrition specialist (dietitian). This may include: ? Eating fewer calories. ? Eating more protein. ? Eating less unhealthy fats. ? Eating a diet that includes fresh fruits and vegetables, whole grains, low-fat dairy products, and lean protein. ? Avoiding foods with added fat, salt, and sugar.  Drink plenty of water while you exercise to prevent dehydration or heat stroke. Activity  Choose an activity that you enjoy and set realistic goals. Your health care provider can help you make an exercise plan that works for you.  Exercise at a moderate or vigorous intensity most days of the week. ? The intensity of exercise may vary from person to person. You can tell how intense a workout is for you by paying attention to your breathing and heartbeat. Most people will notice their breathing and heartbeat get faster with more intense exercise.  Do resistance training twice each week, such as: ? Push-ups. ? Sit-ups. ? Lifting weights. ? Using resistance bands.  Getting short amounts of exercise can be just as helpful as long structured periods of exercise. If you have trouble finding time to exercise, try to include exercise in your daily  routine. ? Get up, stretch, and walk around every 30 minutes throughout the day. ? Go for a walk during your lunch break. ? Park your car farther away from your destination. ? If you take public transportation, get off one stop early  and walk the rest of the way. ? Make phone calls while standing up and walking around. ? Take the stairs instead of elevators or escalators.  Wear comfortable clothes and shoes with good support.  Do not exercise so much that you hurt yourself, feel dizzy, or get very short of breath. Where to find more information  U.S. Department of Health and Human Services: BondedCompany.at  Centers for Disease Control and Prevention (CDC): http://www.wolf.info/ Contact a health care provider:  Before starting a new exercise program.  If you have questions or concerns about your weight.  If you have a medical problem that keeps you from exercising. Get help right away if you have any of the following while exercising:  Injury.  Dizziness.  Difficulty breathing or shortness of breath that does not go away when you stop exercising.  Chest pain.  Rapid heartbeat. Summary  Being overweight increases your risk of heart disease, stroke, diabetes, high blood pressure, and several types of cancer.  Losing weight happens when you burn more calories than you eat.  Reducing the amount of calories you eat in addition to getting regular moderate or vigorous exercise each week helps you lose weight. This information is not intended to replace advice given to you by your health care provider. Make sure you discuss any questions you have with your health care provider. Document Revised: 03/26/2017 Document Reviewed: 03/26/2017 Elsevier Patient Education  2020 [employer].

## 2019-06-29 ENCOUNTER — Other Ambulatory Visit: Payer: Self-pay | Admitting: Internal Medicine

## 2019-06-29 DIAGNOSIS — E669 Obesity, unspecified: Secondary | ICD-10-CM

## 2019-06-29 MED ORDER — PHENTERMINE HCL 37.5 MG PO TABS: 37.5000 mg | ORAL_TABLET | Freq: Every day | ORAL | 0 refills | Status: DC

## 2019-07-01 ENCOUNTER — Telehealth: Payer: Self-pay | Admitting: Internal Medicine

## 2019-07-01 NOTE — Telephone Encounter (Signed)
Left message to return call 

## 2019-07-01 NOTE — Telephone Encounter (Signed)
-----   Message from Larey Days sent at 07/01/2019  9:44 AM EDT ----- The patient needs to call her insurance carrier . That is the patient's responsibility . We will file any coverage.  However, it is the patient's responsibility to to see if she is covered. So if she wants a physical and her insurance does not pay . It will be her responsibility not the office. ----- Message ----- From: McLean-Scocuzza, Nino Glow, MD Sent: 07/01/2019   9:24 AM EDT To: Larey Days  Does her insurance cover in person physicals?   ----- Message ----- From: Philbert Riser Sent: 07/01/2019   8:55 AM EDT To: Nino Glow McLean-Scocuzza, MD  HI Happy Easter to you, I am not sure, this is not familiar to me, the patient should be aware if they do or not, or you could have someone call from office to verify.  Thanks, Dawn ----- Message ----- From: McLean-Scocuzza, Nino Glow, MD Sent: 06/26/2019   7:45 PM EDT To: Philbert Riser  Hi happy easter Does this insurance cover physicals?   Thanks tMS

## 2019-07-01 NOTE — Telephone Encounter (Signed)
Stacy Miles  Stacy Miles, Stacy Glow, MD  The patient needs to call her insurance carrier . That is the patient's responsibility . We will file any coverage. However, it is the patient's responsibility to to see if she is covered. So if she wants a physical and her insurance does not pay . It will be her responsibility not the office.        Does pts insurance cover physical?  Have her call and see and call us back

## 2019-07-07 NOTE — Telephone Encounter (Signed)
Left message to return call.  Phone encounter printed and mailed to address on file asking for a call back.

## 2019-07-10 NOTE — Telephone Encounter (Signed)
Patient returned office phone call. Read message about phone visit being mailed out. Patient was told if office had to speak to her about something else, office would call her back.

## 2019-07-11 NOTE — Telephone Encounter (Signed)
Left message asking patient to call back with answer to if insurance covers physicals

## 2019-07-15 NOTE — Telephone Encounter (Signed)
Patient needs to wait to hear from insurance as to will they cover a CPE we do not call an get authorization for physicals.

## 2019-08-28 ENCOUNTER — Encounter: Payer: Self-pay | Admitting: Internal Medicine

## 2019-09-08 ENCOUNTER — Other Ambulatory Visit: Payer: Self-pay | Admitting: Obstetrics and Gynecology

## 2019-09-08 NOTE — Telephone Encounter (Signed)
Medication refill request: Dorothy Puffer #[id number], R0 Last AEX:  10-10-18 Next AEX: 11-06-19 Last MMG (if hormonal medication request): 12-19-18 Neg/BiRads1 Refill authorized: please authorize if appropriate

## 2019-10-16 ENCOUNTER — Ambulatory Visit: Payer: PRIVATE HEALTH INSURANCE | Admitting: Obstetrics and Gynecology

## 2019-10-29 ENCOUNTER — Inpatient Hospital Stay: Payer: PRIVATE HEALTH INSURANCE | Admitting: Family

## 2019-10-29 ENCOUNTER — Inpatient Hospital Stay: Payer: PRIVATE HEALTH INSURANCE

## 2019-11-05 NOTE — Progress Notes (Signed)
44 y.o. G0P0000 Single Black or African American Not Hispanic or Latino female here for annual exam.  She is interested in other birth control options. On continuous OCP's, no cycles. She worries that the pill is contributing to her weight gain. Feels more bloated. Takes a small water pill for the bloating. She has some increase in gas.  She has some night sweats. Tolerable.  H/O fibroid uterus (not deviating her cavity), dysmenorrhea, menorrhagia and severe anemia. No bleeding at all on OCP's.     Not sexually active in the last year.   No LMP recorded. (Menstrual status: Oral contraceptives).          Sexually active: No.  The current method of family planning is OCP (estrogen/progesterone).    Exercising: Yes.    The patient does not participate in regular exercise at present. Smoker:  no  Health Maintenance: Pap:  09/21/16 WIL hpv neg,  10/2011 per patient normal  History of abnormal Pap:  no MMG:  12/19/18 Bi-rads 1 neg  BMD:   Never  Colonoscopy: never  TDaP:  7/17 Gardasil: no   reports that she has never smoked. She has never used smokeless tobacco. She reports that she does not drink alcohol and does not use drugs. 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   Past Medical History:  Diagnosis Date  . Anemia   . Depression   . Dysmenorrhea   . Fibroid   . History of blood transfusion 09/2016   [facility] - 2 units transfused  . Insomnia   . Iron deficiency anemia   . Stress     Past Surgical History:  Procedure Laterality Date  . DILATATION & CURETTAGE/HYSTEROSCOPY WITH MYOSURE N/A 11/13/2016   Procedure: DILATATION & CURETTAGE/HYSTEROSCOPY WITH MYOSURE;  Surgeon: Salvadore Dom, MD;  Location: [facility];  Service: Gynecology;  Laterality: N/A;  . DILATION AND CURETTAGE OF UTERUS    . OTHER SURGICAL HISTORY     cervical polyp removal     Current Outpatient Medications  Medication Sig Dispense Refill  . acetaminophen (TYLENOL) 650 MG CR tablet Take 650 mg by  mouth every 8 (eight) hours as needed for pain or fever.    . Cholecalciferol (VITAMIN D3) 125 MCG (5000 UT) TABS Take by mouth daily.    . folic acid (FOLVITE) 1 MG tablet TAKE 1 TABLET BY MOUTH EVERY DAY 90 tablet 3  . furosemide (LASIX) 20 MG tablet Take 1 tablet (20 mg total) by mouth daily as needed. 30 tablet 11  . ibuprofen (ADVIL) 800 MG tablet Take 1 tablet (800 mg total) by mouth every 8 (eight) hours as needed. 30 tablet 1  . ipratropium (ATROVENT) 0.06 % nasal spray Place 2 sprays into both nostrils 3 (three) times daily. 15 mL 12  . LARISSIA 0.1-20 MG-MCG tablet TAKE 1 TABLET BY MOUTH DAILY. SKIP PLACEBO WEEK, TAKE CONTINUOUSLY. 112 tablet 0  . loratadine (CLARITIN) 10 MG tablet Take 1 tablet (10 mg total) by mouth daily as needed for allergies. 90 tablet 3   No current facility-administered medications for this visit.    Family History  Problem Relation Age of Onset  . Diabetes Mother   . Hypertension Mother   . Arthritis Mother   . Asthma Mother   . COPD Father   . Breast cancer Maternal Grandmother        mat great gm    Review of Systems  All other systems reviewed and are negative.  Exam:   BP 136/78   Pulse 82   Ht 5' 5.75" (1.67 m)   Wt 258 lb (117 kg)   SpO2 99%   BMI 41.96 kg/m   Weight change: @WEIGHTCHANGE @ Height:   Height: 5' 5.75" (167 cm)  Ht Readings from Last 3 Encounters:  11/06/19 5' 5.75" (1.67 m)  06/26/19 5\' 6"  (1.676 m)  05/01/19 5\' 6"  (1.676 m)    General appearance: alert, cooperative and appears stated age Head: Normocephalic, without obvious abnormality, atraumatic Neck: no adenopathy, supple, symmetrical, trachea midline and thyroid normal to inspection and palpation Lungs: clear to auscultation bilaterally Cardiovascular: regular rate and rhythm Breasts: normal appearance, no masses or tenderness Abdomen: soft, non-tender; non distended,  no masses,  no organomegaly Extremities: extremities normal, atraumatic, no cyanosis or  edema Skin: Skin color, texture, turgor normal. No rashes or lesions Lymph nodes: Cervical, supraclavicular, and axillary nodes normal. No abnormal inguinal nodes palpated Neurologic: Grossly normal   Pelvic: External genitalia:  no lesions              Urethra:  normal appearing urethra with no masses, tenderness or lesions              Bartholins and Skenes: normal                 Vagina: normal appearing vagina with normal color and discharge, no lesions              Cervix: no lesions               Bimanual Exam:  Uterus:  anteverted, mobile, not appreciably enlarged. Exam limited by BMI.               Adnexa: no mass, fullness, tenderness               Rectovaginal: Confirms               Anus:  normal sphincter tone, no lesions  Gae Dry chaperoned for the exam.  A:  Well Woman with normal exam  H/O fibroid uterus  H/O menorrhagia leading to anemia, controlled on OCP's. Discussed options. She will continue.   P:   No pap this year  Labs with primary  Start gardasil series  Discussed breast self exam  Discussed calcium and vit D intake  Continue OCP's

## 2019-11-06 ENCOUNTER — Ambulatory Visit: Payer: PRIVATE HEALTH INSURANCE | Admitting: Obstetrics and Gynecology

## 2019-11-06 ENCOUNTER — Other Ambulatory Visit: Payer: Self-pay

## 2019-11-06 ENCOUNTER — Encounter: Payer: Self-pay | Admitting: Obstetrics and Gynecology

## 2019-11-06 VITALS — BP 136/78 | HR 82 | Ht 65.75 in | Wt 258.0 lb

## 2019-11-06 DIAGNOSIS — Z8742 Personal history of other diseases of the female genital tract: Secondary | ICD-10-CM

## 2019-11-06 DIAGNOSIS — Z01419 Encounter for gynecological examination (general) (routine) without abnormal findings: Secondary | ICD-10-CM | POA: Diagnosis not present

## 2019-11-06 DIAGNOSIS — Z3041 Encounter for surveillance of contraceptive pills: Secondary | ICD-10-CM | POA: Diagnosis not present

## 2019-11-06 DIAGNOSIS — D259 Leiomyoma of uterus, unspecified: Secondary | ICD-10-CM | POA: Diagnosis not present

## 2019-11-06 DIAGNOSIS — Z23 Encounter for immunization: Secondary | ICD-10-CM | POA: Diagnosis not present

## 2019-11-06 DIAGNOSIS — Z862 Personal history of diseases of the blood and blood-forming organs and certain disorders involving the immune mechanism: Secondary | ICD-10-CM

## 2019-11-06 MED ORDER — LEVONORGESTREL-ETHINYL ESTRAD 0.1-20 MG-MCG PO TABS: ORAL_TABLET | ORAL | 3 refills | Status: DC

## 2019-11-06 NOTE — Addendum Note (Signed)
Addended by: Yates Decamp on: 11/06/2019 03:09 PM   Modules accepted: Orders

## 2019-11-06 NOTE — Patient Instructions (Signed)
EXERCISE AND DIET:  We recommended that you start or continue a regular exercise program for good health. Regular exercise means any activity that makes your heart beat faster and makes you sweat.  We recommend exercising at least 30 minutes per day at least 3 days a week, preferably 4 or 5.  We also recommend a diet low in fat and sugar.  Inactivity, poor dietary choices and obesity can cause diabetes, heart attack, stroke, and kidney damage, among others.    ALCOHOL AND SMOKING:  Women should limit their alcohol intake to no more than 7 drinks/beers/glasses of wine (combined, not each!) per week. Moderation of alcohol intake to this level decreases your risk of breast cancer and liver damage. And of course, no recreational drugs are part of a healthy lifestyle.  And absolutely no smoking or even second hand smoke. Most people know smoking can cause heart and lung diseases, but did you know it also contributes to weakening of your bones? Aging of your skin?  Yellowing of your teeth and nails?  CALCIUM AND VITAMIN D:  Adequate intake of calcium and Vitamin D are recommended.  The recommendations for exact amounts of these supplements seem to change often, but generally speaking 1,000 mg of calcium (between diet and supplement) and 800 units of Vitamin D per day seems prudent. Certain women may benefit from higher intake of Vitamin D.  If you are among these women, your doctor will have told you during your visit.    PAP SMEARS:  Pap smears, to check for cervical cancer or precancers,  have traditionally been done yearly, although recent scientific advances have shown that most women can have pap smears less often.  However, every woman still should have a physical exam from her gynecologist every year. It will include a breast check, inspection of the vulva and vagina to check for abnormal growths or skin changes, a visual exam of the cervix, and then an exam to evaluate the size and shape of the uterus and  ovaries.  And after 43 years of age, a rectal exam is indicated to check for rectal cancers. We will also provide age appropriate advice regarding health maintenance, like when you should have certain vaccines, screening for sexually transmitted diseases, bone density testing, colonoscopy, mammograms, etc.   MAMMOGRAMS:  All women over 40 years old should have a yearly mammogram. Many facilities now offer a "3D" mammogram, which may cost around $50 extra out of pocket. If possible,  we recommend you accept the option to have the 3D mammogram performed.  It both reduces the number of women who will be called back for extra views which then turn out to be normal, and it is better than the routine mammogram at detecting truly abnormal areas.    COLON CANCER SCREENING: Now recommend starting at age 45. At this time colonoscopy is not covered for routine screening until 50. There are take home tests that can be done between 45-49.   COLONOSCOPY:  Colonoscopy to screen for colon cancer is recommended for all women at age 50.  We know, you hate the idea of the prep.  We agree, BUT, having colon cancer and not knowing it is worse!!  Colon cancer so often starts as a polyp that can be seen and removed at colonscopy, which can quite literally save your life!  And if your first colonoscopy is normal and you have no family history of colon cancer, most women don't have to have it again for   10 years.  Once every ten years, you can do something that may end up saving your life, right?  We will be happy to help you get it scheduled when you are ready.  Be sure to check your insurance coverage so you understand how much it will cost.  It may be covered as a preventative service at no cost, but you should check your particular policy.      Breast Self-Awareness Breast self-awareness means being familiar with how your breasts look and feel. It involves checking your breasts regularly and reporting any changes to your  health care provider. Practicing breast self-awareness is important. A change in your breasts can be a sign of a serious medical problem. Being familiar with how your breasts look and feel allows you to find any problems early, when treatment is more likely to be successful. All women should practice breast self-awareness, including women who have had breast implants. How to do a breast self-exam One way to learn what is normal for your breasts and whether your breasts are changing is to do a breast self-exam. To do a breast self-exam: Look for Changes  1. Remove all the clothing above your waist. 2. Stand in front of a mirror in a room with good lighting. 3. Put your hands on your hips. 4. Push your hands firmly downward. 5. Compare your breasts in the mirror. Look for differences between them (asymmetry), such as: ? Differences in shape. ? Differences in size. ? Puckers, dips, and bumps in one breast and not the other. 6. Look at each breast for changes in your skin, such as: ? Redness. ? Scaly areas. 7. Look for changes in your nipples, such as: ? Discharge. ? Bleeding. ? Dimpling. ? Redness. ? A change in position. Feel for Changes Carefully feel your breasts for lumps and changes. It is best to do this while lying on your back on the floor and again while sitting or standing in the shower or tub with soapy water on your skin. Feel each breast in the following way:  Place the arm on the side of the breast you are examining above your head.  Feel your breast with the other hand.  Start in the nipple area and make  inch (2 cm) overlapping circles to feel your breast. Use the pads of your three middle fingers to do this. Apply light pressure, then medium pressure, then firm pressure. The light pressure will allow you to feel the tissue closest to the skin. The medium pressure will allow you to feel the tissue that is a little deeper. The firm pressure will allow you to feel the tissue  close to the ribs.  Continue the overlapping circles, moving downward over the breast until you feel your ribs below your breast.  Move one finger-width toward the center of the body. Continue to use the  inch (2 cm) overlapping circles to feel your breast as you move slowly up toward your collarbone.  Continue the up and down exam using all three pressures until you reach your armpit.  Write Down What You Find  Write down what is normal for each breast and any changes that you find. Keep a written record with breast changes or normal findings for each breast. By writing this information down, you do not need to depend only on memory for size, tenderness, or location. Write down where you are in your menstrual cycle, if you are still menstruating. If you are having trouble noticing differences   in your breasts, do not get discouraged. With time you will become more familiar with the variations in your breasts and more comfortable with the exam. How often should I examine my breasts? Examine your breasts every month. If you are breastfeeding, the best time to examine your breasts is after a feeding or after using a breast pump. If you menstruate, the best time to examine your breasts is 5-7 days after your period is over. During your period, your breasts are lumpier, and it may be more difficult to notice changes. When should I see my health care provider? See your health care provider if you notice:  A change in shape or size of your breasts or nipples.  A change in the skin of your breast or nipples, such as a reddened or scaly area.  Unusual discharge from your nipples.  A lump or thick area that was not there before.  Pain in your breasts.  Anything that concerns you.  

## 2019-11-13 ENCOUNTER — Other Ambulatory Visit: Payer: Self-pay

## 2019-11-13 ENCOUNTER — Other Ambulatory Visit (INDEPENDENT_AMBULATORY_CARE_PROVIDER_SITE_OTHER): Payer: PRIVATE HEALTH INSURANCE

## 2019-11-13 DIAGNOSIS — Z0000 Encounter for general adult medical examination without abnormal findings: Secondary | ICD-10-CM

## 2019-11-13 DIAGNOSIS — Z13220 Encounter for screening for lipoid disorders: Secondary | ICD-10-CM | POA: Diagnosis not present

## 2019-11-13 DIAGNOSIS — E559 Vitamin D deficiency, unspecified: Secondary | ICD-10-CM

## 2019-11-13 DIAGNOSIS — Z1329 Encounter for screening for other suspected endocrine disorder: Secondary | ICD-10-CM

## 2019-11-13 DIAGNOSIS — Z1389 Encounter for screening for other disorder: Secondary | ICD-10-CM

## 2019-11-13 LAB — COMPREHENSIVE METABOLIC PANEL
ALT: 12 U/L (ref 0–35)
AST: 14 U/L (ref 0–37)
Albumin: 3.9 g/dL (ref 3.5–5.2)
Alkaline Phosphatase: 111 U/L (ref 39–117)
BUN: 10 mg/dL (ref 6–23)
CO2: 20 mEq/L (ref 19–32)
Calcium: 9.7 mg/dL (ref 8.4–10.5)
Chloride: 108 mEq/L (ref 96–112)
Creatinine, Ser: 0.71 mg/dL (ref 0.40–1.20)
GFR: 108.72 mL/min (ref >60.00)
Glucose, Bld: 108 mg/dL — ABNORMAL HIGH (ref 70–99)
Potassium: 4.3 mEq/L (ref 3.5–5.1)
Sodium: 136 mEq/L (ref 135–145)
Total Bilirubin: 0.4 mg/dL (ref 0.2–1.2)
Total Protein: 6.9 g/dL (ref 6.0–8.3)

## 2019-11-13 LAB — LIPID PANEL
Cholesterol: 225 mg/dL — ABNORMAL HIGH (ref 0–200)
HDL: 72.7 mg/dL (ref >39.00)
LDL Cholesterol: 131 mg/dL — ABNORMAL HIGH (ref 0–99)
NonHDL: 151.92
Total CHOL/HDL Ratio: 3
Triglycerides: 105 mg/dL (ref 0.0–149.0)
VLDL: 21 mg/dL (ref 0.0–40.0)

## 2019-11-13 LAB — VITAMIN D 25 HYDROXY (VIT D DEFICIENCY, FRACTURES): VITD: 20.77 ng/mL — ABNORMAL LOW (ref 30.00–100.00)

## 2019-11-13 LAB — TSH: TSH: 2.78 u[IU]/mL (ref 0.35–4.50)

## 2019-11-14 LAB — URINALYSIS, ROUTINE W REFLEX MICROSCOPIC
Bacteria, UA: NONE SEEN /HPF
Bilirubin Urine: NEGATIVE
Glucose, UA: NEGATIVE
Hyaline Cast: NONE SEEN /LPF
Ketones, ur: NEGATIVE
Nitrite: NEGATIVE
Protein, ur: NEGATIVE
Specific Gravity, Urine: 1.02 (ref 1.001–1.03)
pH: <=5 (ref 5.0–8.0)

## 2019-12-11 ENCOUNTER — Inpatient Hospital Stay: Payer: PRIVATE HEALTH INSURANCE | Attending: Hematology & Oncology

## 2019-12-11 ENCOUNTER — Inpatient Hospital Stay (HOSPITAL_BASED_OUTPATIENT_CLINIC_OR_DEPARTMENT_OTHER): Payer: PRIVATE HEALTH INSURANCE | Admitting: Family

## 2019-12-11 ENCOUNTER — Other Ambulatory Visit: Payer: Self-pay

## 2019-12-11 ENCOUNTER — Encounter: Payer: Self-pay | Admitting: Family

## 2019-12-11 VITALS — BP 130/70 | HR 70 | Temp 98.3°F | Resp 17 | Ht 66.0 in | Wt 250.8 lb

## 2019-12-11 DIAGNOSIS — D500 Iron deficiency anemia secondary to blood loss (chronic): Secondary | ICD-10-CM

## 2019-12-11 DIAGNOSIS — D560 Alpha thalassemia: Secondary | ICD-10-CM | POA: Diagnosis not present

## 2019-12-11 DIAGNOSIS — N920 Excessive and frequent menstruation with regular cycle: Secondary | ICD-10-CM | POA: Diagnosis present

## 2019-12-11 LAB — CBC WITH DIFFERENTIAL (CANCER CENTER ONLY)
Abs Immature Granulocytes: 0.01 10*3/uL (ref 0.00–0.07)
Basophils Absolute: 0 10*3/uL (ref 0.0–0.1)
Basophils Relative: 1 %
Eosinophils Absolute: 0.1 10*3/uL (ref 0.0–0.5)
Eosinophils Relative: 2 %
HCT: 40.6 % (ref 36.0–46.0)
Hemoglobin: 14.4 g/dL (ref 12.0–15.0)
Immature Granulocytes: 0 %
Lymphocytes Relative: 40 %
Lymphs Abs: 1.7 10*3/uL (ref 0.7–4.0)
MCH: 30.7 pg (ref 26.0–34.0)
MCHC: 35.5 g/dL (ref 30.0–36.0)
MCV: 86.6 fL (ref 80.0–100.0)
Monocytes Absolute: 0.3 10*3/uL (ref 0.1–1.0)
Monocytes Relative: 8 %
Neutro Abs: 2 10*3/uL (ref 1.7–7.7)
Neutrophils Relative %: 49 %
Platelet Count: 230 10*3/uL (ref 150–400)
RBC: 4.69 MIL/uL (ref 3.87–5.11)
RDW: 12.3 % (ref 11.5–15.5)
WBC Count: 4.2 10*3/uL (ref 4.0–10.5)
nRBC: 0 % (ref 0.0–0.2)

## 2019-12-11 LAB — RETICULOCYTES
Immature Retic Fract: 7.6 % (ref 2.3–15.9)
RBC.: 4.71 MIL/uL (ref 3.87–5.11)
Retic Count, Absolute: 101.7 10*3/uL (ref 19.0–186.0)
Retic Ct Pct: 2.2 % (ref 0.4–3.1)

## 2019-12-11 NOTE — Progress Notes (Signed)
Hematology and Oncology Follow Up Visit  Stacy Miles [id number] 1976/03/16 44 y.o. 12/11/2019   Principle Diagnosis:  Iron deficiency anemia secondary to menorrhagia  Current Therapy:        IV iron as indicated   Interim History:  Stacy Miles is here today for follow-up. She is doing well and has no complaints at this time.  Since starting birth control she only has occasional spotting. No other blood loss noted. No bruising or petechiae.  She is taking her folic acid daily as prescribed.  No fever, chills, n/v, cough, rash, dizziness, SOB, chest pain, palpitations, abdominal pain or changes in bowel or bladder habits.  No swelling, tenderness, numbness or tingling in her extremities.  No falls or syncope.  She has maintained a good appetite and is staying well hydrated. Her weight is stable.  ECOG Performance Status: 0 - Asymptomatic  Medications:  Allergies as of 12/11/2019   No Known Allergies     Medication List       Accurate as of December 11, 2019  2:14 PM. If you have any questions, ask your nurse or doctor.        acetaminophen 650 MG CR tablet Commonly known as: TYLENOL Take 650 mg by mouth every 8 (eight) hours as needed for pain or fever.   folic acid 1 MG tablet Commonly known as: FOLVITE TAKE 1 TABLET BY MOUTH EVERY DAY   furosemide 20 MG tablet Commonly known as: LASIX Take 1 tablet (20 mg total) by mouth daily as needed.   ibuprofen 800 MG tablet Commonly known as: ADVIL Take 1 tablet (800 mg total) by mouth every 8 (eight) hours as needed.   ipratropium 0.06 % nasal spray Commonly known as: ATROVENT Place 2 sprays into both nostrils 3 (three) times daily.   levonorgestrel-ethinyl estradiol 0.1-20 MG-MCG tablet Commonly known as: [place]ia TAKE 1 TABLET BY MOUTH DAILY. SKIP PLACEBO WEEK, TAKE CONTINUOUSLY.   loratadine 10 MG tablet Commonly known as: CLARITIN Take 1 tablet (10 mg total) by mouth daily as needed for allergies.   Vitamin  D3 125 MCG (5000 UT) Tabs Take by mouth daily.       Allergies: No Known Allergies  Past Medical History, Surgical history, Social history, and Family History were reviewed and updated.  Review of Systems: All other 10 point review of systems is negative.   Physical Exam:  height is 5\' 6"  (1.676 m).   Wt Readings from Last 3 Encounters:  11/06/19 258 lb (117 kg)  06/26/19 252 lb (114.3 kg)  05/01/19 253 lb 6.4 oz (114.9 kg)    Ocular: Sclerae unicteric, pupils equal, round and reactive to light Ear-nose-throat: Oropharynx clear, dentition fair Lymphatic: No cervical or supraclavicular adenopathy Lungs no rales or rhonchi, good excursion bilaterally Heart regular rate and rhythm, no murmur appreciated Abd soft, nontender, positive bowel sounds MSK no focal spinal tenderness, no joint edema Neuro: non-focal, well-oriented, appropriate affect Breasts: Deferred   Lab Results  Component Value Date   WBC 4.1 05/01/2019   HGB 13.9 05/01/2019   HCT 40.7 05/01/2019   MCV 88.5 05/01/2019   PLT 259 05/01/2019   Lab Results  Component Value Date   FERRITIN 98 05/01/2019   IRON 80 05/01/2019   TIBC 353 05/01/2019   UIBC 273 05/01/2019   IRONPCTSAT 23 05/01/2019   Lab Results  Component Value Date   RETICCTPCT 2.0 05/01/2019   RBC 4.60 05/01/2019   RBC 4.62 05/01/2019   No results found for:  KPAFRELGTCHN, LAMBDASER, KAPLAMBRATIO No results found for: IGGSERUM, IGA, IGMSERUM No results found for: Odetta Pink, SPEI   Chemistry      Component Value Date/Time   NA 136 11/13/2019 0802   NA 135 09/29/2016 0820   K 4.3 11/13/2019 0802   K 3.6 09/29/2016 0820   CL 108 11/13/2019 0802   CL 109 (H) 09/29/2016 0820   CO2 20 11/13/2019 0802   CO2 21 09/29/2016 0820   BUN 10 11/13/2019 0802   BUN 7 09/29/2016 0820   CREATININE 0.71 11/13/2019 0802   CREATININE 0.5 (L) 09/29/2016 0820      Component Value Date/Time    CALCIUM 9.7 11/13/2019 0802   CALCIUM 8.7 09/29/2016 0820   ALKPHOS 111 11/13/2019 0802   ALKPHOS 82 09/29/2016 0820   AST 14 11/13/2019 0802   AST 22 09/29/2016 0820   ALT 12 11/13/2019 0802   ALT 23 09/29/2016 0820   BILITOT 0.4 11/13/2019 0802   BILITOT 0.70 09/29/2016 0820       Impression and Plan: Stacy Miles is a very pleasant African American female with iron deficiency anemia. We will see what her iron levels look like and replace if needed.  Follow-up in 8 months.  She can contact our office with any questions or concerns.   Laverna Peace, NP 9/16/20212:14 PM

## 2019-12-12 LAB — FERRITIN: Ferritin: 129 ng/mL (ref 11–307)

## 2019-12-12 LAB — IRON AND TIBC
Iron: 127 ug/dL (ref 41–142)
Saturation Ratios: 35 % (ref 21–57)
TIBC: 359 ug/dL (ref 236–444)
UIBC: 232 ug/dL (ref 120–384)

## 2020-01-01 ENCOUNTER — Encounter: Payer: Self-pay | Admitting: Internal Medicine

## 2020-01-01 ENCOUNTER — Other Ambulatory Visit: Payer: Self-pay

## 2020-01-01 ENCOUNTER — Ambulatory Visit: Payer: PRIVATE HEALTH INSURANCE | Admitting: Internal Medicine

## 2020-01-01 VITALS — BP 140/88 | HR 88 | Temp 98.4°F | Ht 66.0 in | Wt 249.2 lb

## 2020-01-01 DIAGNOSIS — E559 Vitamin D deficiency, unspecified: Secondary | ICD-10-CM | POA: Diagnosis not present

## 2020-01-01 DIAGNOSIS — L239 Allergic contact dermatitis, unspecified cause: Secondary | ICD-10-CM

## 2020-01-01 DIAGNOSIS — J321 Chronic frontal sinusitis: Secondary | ICD-10-CM | POA: Diagnosis not present

## 2020-01-01 DIAGNOSIS — J40 Bronchitis, not specified as acute or chronic: Secondary | ICD-10-CM

## 2020-01-01 DIAGNOSIS — L219 Seborrheic dermatitis, unspecified: Secondary | ICD-10-CM

## 2020-01-01 DIAGNOSIS — I10 Essential (primary) hypertension: Secondary | ICD-10-CM

## 2020-01-01 DIAGNOSIS — J309 Allergic rhinitis, unspecified: Secondary | ICD-10-CM

## 2020-01-01 MED ORDER — MONTELUKAST SODIUM 10 MG PO TABS: 10.0000 mg | ORAL_TABLET | Freq: Every day | ORAL | 3 refills | Status: DC

## 2020-01-01 MED ORDER — KETOCONAZOLE 2 % EX SHAM: 1.0000 "application " | MEDICATED_SHAMPOO | CUTANEOUS | 11 refills | Status: DC

## 2020-01-01 MED ORDER — AZITHROMYCIN 250 MG PO TABS: ORAL_TABLET | ORAL | 0 refills | Status: DC

## 2020-01-01 MED ORDER — CHOLECALCIFEROL 1.25 MG (50000 UT) PO CAPS: 50000.0000 [IU] | ORAL_CAPSULE | ORAL | 1 refills | Status: DC

## 2020-01-01 MED ORDER — HYDROCORTISONE 2.5 % EX OINT: TOPICAL_OINTMENT | Freq: Two times a day (BID) | CUTANEOUS | 11 refills | Status: DC

## 2020-01-01 MED ORDER — ALBUTEROL SULFATE HFA 108 (90 BASE) MCG/ACT IN AERS: 1.0000 | INHALATION_SPRAY | Freq: Four times a day (QID) | RESPIRATORY_TRACT | 1 refills | Status: DC | PRN

## 2020-01-01 NOTE — Patient Instructions (Addendum)
mucinex DM green label for cough  Warm tea honey and lemon   Nasal saline and flonase with atrovent for sinuses     Call insurance and see if preferred site for mammogram   Blood pressure cuff amazon <130/<80    DASH Eating Plan DASH stands for "Dietary Approaches to Stop Hypertension." The DASH eating plan is a healthy eating plan that has been shown to reduce high blood pressure (hypertension). It may also reduce your risk for type 2 diabetes, heart disease, and stroke. The DASH eating plan may also help with weight loss. What are tips for following this plan?  General guidelines  Avoid eating more than 2,300 mg (milligrams) of salt (sodium) a day. If you have hypertension, you may need to reduce your sodium intake to 1,500 mg a day.  Limit alcohol intake to no more than 1 drink a day for nonpregnant women and 2 drinks a day for men. One drink equals 12 oz of beer, 5 oz of wine, or 1 oz of hard liquor.  Work with your health care provider to maintain a healthy body weight or to lose weight. Ask what an ideal weight is for you.  Get at least 30 minutes of exercise that causes your heart to beat faster (aerobic exercise) most days of the week. Activities may include walking, swimming, or biking.  Work with your health care provider or diet and nutrition specialist (dietitian) to adjust your eating plan to your individual calorie needs. Reading food labels   Check food labels for the amount of sodium per serving. Choose foods with less than 5 percent of the Daily Value of sodium. Generally, foods with less than 300 mg of sodium per serving fit into this eating plan.  To find whole grains, look for the word "whole" as the first word in the ingredient list. Shopping  Buy products labeled as "low-sodium" or "no salt added."  Buy fresh foods. Avoid canned foods and premade or frozen meals. Cooking  Avoid adding salt when cooking. Use salt-free seasonings or herbs instead of table  salt or sea salt. Check with your health care provider or pharmacist before using salt substitutes.  Do not fry foods. Cook foods using healthy methods such as baking, boiling, grilling, and broiling instead.  Cook with heart-healthy oils, such as olive, canola, soybean, or sunflower oil. Meal planning  Eat a balanced diet that includes: ? 5 or more servings of fruits and vegetables each day. At each meal, try to fill half of your plate with fruits and vegetables. ? Up to 6-8 servings of whole grains each day. ? Less than 6 oz of lean meat, poultry, or fish each day. A 3-oz serving of meat is about the same size as a deck of cards. One egg equals 1 oz. ? 2 servings of low-fat dairy each day. ? A serving of nuts, seeds, or beans 5 times each week. ? Heart-healthy fats. Healthy fats called Omega-3 fatty acids are found in foods such as flaxseeds and coldwater fish, like sardines, salmon, and mackerel.  Limit how much you eat of the following: ? Canned or prepackaged foods. ? Food that is high in trans fat, such as fried foods. ? Food that is high in saturated fat, such as fatty meat. ? Sweets, desserts, sugary drinks, and other foods with added sugar. ? Full-fat dairy products.  Do not salt foods before eating.  Try to eat at least 2 vegetarian meals each week.  Eat more home-cooked food  and less restaurant, buffet, and fast food.  When eating at a restaurant, ask that your food be prepared with less salt or no salt, if possible. What foods are recommended? The items listed may not be a complete list. Talk with your dietitian about what dietary choices are best for you. Grains Whole-grain or whole-wheat bread. Whole-grain or whole-wheat pasta. Brown rice. [place]. Bulgur. Whole-grain and low-sodium cereals. Pita bread. Low-fat, low-sodium crackers. Whole-wheat flour tortillas. Vegetables Fresh or frozen vegetables (raw, steamed, roasted, or grilled). Low-sodium or  reduced-sodium tomato and vegetable juice. Low-sodium or reduced-sodium tomato sauce and tomato paste. Low-sodium or reduced-sodium canned vegetables. Fruits All fresh, dried, or frozen fruit. Canned fruit in natural juice (without added sugar). Meat and other protein foods Skinless chicken or [place]. Ground chicken or [place]. Pork with fat trimmed off. Fish and seafood. Egg whites. Dried beans, peas, or lentils. Unsalted nuts, nut butters, and seeds. Unsalted canned beans. Lean cuts of beef with fat trimmed off. Low-sodium, lean deli meat. Dairy Low-fat (1%) or fat-free (skim) milk. Fat-free, low-fat, or reduced-fat cheeses. Nonfat, low-sodium ricotta or cottage cheese. Low-fat or nonfat yogurt. Low-fat, low-sodium cheese. Fats and oils Soft margarine without trans fats. Vegetable oil. Low-fat, reduced-fat, or light mayonnaise and salad dressings (reduced-sodium). Canola, safflower, olive, soybean, and sunflower oils. Avocado. Seasoning and other foods Herbs. Spices. Seasoning mixes without salt. Unsalted popcorn and pretzels. Fat-free sweets. What foods are not recommended? The items listed may not be a complete list. Talk with your dietitian about what dietary choices are best for you. Grains Baked goods made with fat, such as croissants, muffins, or some breads. Dry pasta or rice meal packs. Vegetables Creamed or fried vegetables. Vegetables in a cheese sauce. Regular canned vegetables (not low-sodium or reduced-sodium). Regular canned tomato sauce and paste (not low-sodium or reduced-sodium). Regular tomato and vegetable juice (not low-sodium or reduced-sodium). Angie Fava. Olives. Fruits Canned fruit in a light or heavy syrup. Fried fruit. Fruit in cream or butter sauce. Meat and other protein foods Fatty cuts of meat. Ribs. Fried meat. Berniece Salines. Sausage. Bologna and other processed lunch meats. Salami. Fatback. Hotdogs. Bratwurst. Salted nuts and seeds. Canned beans with added salt. Canned or  smoked fish. Whole eggs or egg yolks. Chicken or [place] with skin. Dairy Whole or 2% milk, cream, and half-and-half. Whole or full-fat cream cheese. Whole-fat or sweetened yogurt. Full-fat cheese. Nondairy creamers. Whipped toppings. Processed cheese and cheese spreads. Fats and oils Butter. Stick margarine. Lard. Shortening. Ghee. Bacon fat. Tropical oils, such as coconut, palm kernel, or palm oil. Seasoning and other foods Salted popcorn and pretzels. Onion salt, garlic salt, seasoned salt, table salt, and sea salt. Worcestershire sauce. Tartar sauce. Barbecue sauce. Teriyaki sauce. Soy sauce, including reduced-sodium. Steak sauce. Canned and packaged gravies. Fish sauce. Oyster sauce. Cocktail sauce. Horseradish that you find on the shelf. Ketchup. Mustard. Meat flavorings and tenderizers. Bouillon cubes. Hot sauce and Tabasco sauce. Premade or packaged marinades. Premade or packaged taco seasonings. Relishes. Regular salad dressings. Where to find more information:  National Heart, Lung, and [place]: https://wilson-eaton.com/  American Heart Association: www.heart.org Summary  The DASH eating plan is a healthy eating plan that has been shown to reduce high blood pressure (hypertension). It may also reduce your risk for type 2 diabetes, heart disease, and stroke.  With the DASH eating plan, you should limit salt (sodium) intake to 2,300 mg a [place]. If you have hypertension, you may need to reduce your sodium intake to 1,500 mg a  day.  When on the DASH eating plan, aim to eat more fresh fruits and vegetables, whole grains, lean proteins, low-fat dairy, and heart-healthy fats.  Work with your health care provider or diet and nutrition specialist (dietitian) to adjust your eating plan to your individual calorie needs. This information is not intended to replace advice given to you by your health care provider. Make sure you discuss any questions you have with your health care provider. Document  Revised: 02/23/2017 Document Reviewed: 03/06/2016 Elsevier Patient Education  [place].  Cholesterol Content in Foods Cholesterol is a waxy, fat-like substance that helps to carry fat in the blood. The body needs cholesterol in small amounts, but too much cholesterol can cause damage to the arteries and heart. Most people should eat less than 200 milligrams (mg) of cholesterol a day. Foods with cholesterol  Cholesterol is found in animal-based foods, such as meat, seafood, and dairy. Generally, low-fat dairy and lean meats have less cholesterol than full-fat dairy and fatty meats. The milligrams of cholesterol per serving (mg per serving) of common cholesterol-containing foods are listed below. Meat and other proteins  Egg -- one large whole egg has 186 mg.  Veal shank -- 4 oz has 141 mg.  Lean ground [place] (93% lean) -- 4 oz has 118 mg.  Fat-trimmed lamb loin -- 4 oz has 106 mg.  Lean ground beef (90% lean) -- 4 oz has 100 mg.  Lobster -- 3.5 oz has 90 mg.  Pork loin chops -- 4 oz has 86 mg.  Canned salmon -- 3.5 oz has 83 mg.  Fat-trimmed beef top loin -- 4 oz has 78 mg.  Frankfurter -- 1 frank (3.5 oz) has 77 mg.  Crab -- 3.5 oz has 71 mg.  Roasted chicken without skin, white meat -- 4 oz has 66 mg.  Light bologna -- 2 oz has 45 mg.  Deli-cut [place] -- 2 oz has 31 mg.  Canned tuna -- 3.5 oz has 31 mg.  Berniece Salines -- 1 oz has 29 mg.  Oysters and mussels (raw) -- 3.5 oz has 25 mg.  Mackerel -- 1 oz has 22 mg.  Trout -- 1 oz has 20 mg.  Pork sausage -- 1 link (1 oz) has 17 mg.  Salmon -- 1 oz has 16 mg.  Tilapia -- 1 oz has 14 mg. Dairy  Soft-serve ice cream --  cup (4 oz) has 103 mg.  Whole-milk yogurt -- 1 cup (8 oz) has 29 mg.  Cheddar cheese -- 1 oz has 28 mg.  American cheese -- 1 oz has 28 mg.  Whole milk -- 1 cup (8 oz) has 23 mg.  2% milk -- 1 cup (8 oz) has 18 mg.  Cream cheese -- 1 tablespoon (Tbsp) has 15 mg.  Cottage cheese --   cup (4 oz) has 14 mg.  Low-fat (1%) milk -- 1 cup (8 oz) has 10 mg.  Sour cream -- 1 Tbsp has 8.5 mg.  Low-fat yogurt -- 1 cup (8 oz) has 8 mg.  Nonfat Greek yogurt -- 1 cup (8 oz) has 7 mg.  Half-and-half cream -- 1 Tbsp has 5 mg. Fats and oils  Cod liver oil -- 1 tablespoon (Tbsp) has 82 mg.  Butter -- 1 Tbsp has 15 mg.  Lard -- 1 Tbsp has 14 mg.  Bacon grease -- 1 Tbsp has 14 mg.  Mayonnaise -- 1 Tbsp has 5-10 mg.  Margarine -- 1 Tbsp has 3-10 mg. Exact amounts  of cholesterol in these foods may vary depending on specific ingredients and brands. Foods without cholesterol Most plant-based foods do not have cholesterol unless you combine them with a food that has cholesterol. Foods without cholesterol include:  Grains and cereals.  Vegetables.  Fruits.  Vegetable oils, such as olive, canola, and sunflower oil.  Legumes, such as peas, beans, and lentils.  Nuts and seeds.  Egg whites. Summary  The body needs cholesterol in small amounts, but too much cholesterol can cause damage to the arteries and heart.  Most people should eat less than 200 milligrams (mg) of cholesterol a day. This information is not intended to replace advice given to you by your health care provider. Make sure you discuss any questions you have with your health care provider. Document Revised: 02/23/2017 Document Reviewed: 11/07/2016 Elsevier Patient Education  [place].  High Cholesterol  High cholesterol is a condition in which the blood has high levels of a white, waxy, fat-like substance (cholesterol). The human body needs small amounts of cholesterol. The liver makes all the cholesterol that the body needs. Extra (excess) cholesterol comes from the food that we eat. Cholesterol is carried from the liver by the blood through the blood vessels. If you have high cholesterol, deposits (plaques) may build up on the walls of your blood vessels (arteries). Plaques make the arteries  narrower and stiffer. Cholesterol plaques increase your risk for heart attack and stroke. Work with your health care provider to keep your cholesterol levels in a healthy range. What increases the risk? This condition is more likely to develop in people who:  Eat foods that are high in animal fat (saturated fat) or cholesterol.  Are overweight.  Are not getting enough exercise.  Have a family history of high cholesterol. What are the signs or symptoms? There are no symptoms of this condition. How is this diagnosed? This condition may be diagnosed from the results of a blood test.  If you are older than age 66, your health care provider may check your cholesterol every 4-6 years.  You may be checked more often if you already have high cholesterol or other risk factors for heart disease. The blood test for cholesterol measures:  "Bad" cholesterol (LDL cholesterol). This is the main type of cholesterol that causes heart disease. The desired level for LDL is less than 100.  "Good" cholesterol (HDL cholesterol). This type helps to protect against heart disease by cleaning the arteries and carrying the LDL away. The desired level for HDL is 60 or higher.  Triglycerides. These are fats that the body can store or burn for energy. The desired number for triglycerides is lower than 150.  Total cholesterol. This is a measure of the total amount of cholesterol in your blood, including LDL cholesterol, HDL cholesterol, and triglycerides. A healthy number is less than 200. How is this treated? This condition is treated with diet changes, lifestyle changes, and medicines. Diet changes  This may include eating more whole grains, fruits, vegetables, nuts, and fish.  This may also include cutting back on red meat and foods that have a lot of added sugar. Lifestyle changes  Changes may include getting at least 40 minutes of aerobic exercise 3 times a week. Aerobic exercises include walking, biking,  and swimming. Aerobic exercise along with a healthy diet can help you maintain a healthy weight.  Changes may also include quitting smoking. Medicines  Medicines are usually given if diet and lifestyle changes have failed to reduce  your cholesterol to healthy levels.  Your health care provider may prescribe a statin medicine. Statin medicines have been shown to reduce cholesterol, which can reduce the risk of heart disease. Follow these instructions at home: Eating and drinking If told by your health care provider:  Eat chicken (without skin), fish, veal, shellfish, ground [place] breast, and round or loin cuts of red meat.  Do not eat fried foods or fatty meats, such as hot dogs and salami.  Eat plenty of fruits, such as apples.  Eat plenty of vegetables, such as broccoli, potatoes, and carrots.  Eat beans, peas, and lentils.  Eat grains such as barley, rice, couscous, and bulgur wheat.  Eat pasta without cream sauces.  Use skim or nonfat milk, and eat low-fat or nonfat yogurt and cheeses.  Do not eat or drink whole milk, cream, ice cream, egg yolks, or hard cheeses.  Do not eat stick margarine or tub margarines that contain trans fats (also called partially hydrogenated oils).  Do not eat saturated tropical oils, such as coconut oil and palm oil.  Do not eat cakes, cookies, crackers, or other baked goods that contain trans fats.  General instructions  Exercise as directed by your health care provider. Increase your activity level with activities such as gardening, walking, and taking the stairs.  Take over-the-counter and prescription medicines only as told by your health care provider.  Do not use any products that contain nicotine or tobacco, such as cigarettes and e-cigarettes. If you need help quitting, ask your health care provider.  Keep all follow-up visits as told by your health care provider. This is important. Contact a health care provider if:  You are  struggling to maintain a healthy diet or weight.  You need help to start on an exercise program.  You need help to stop smoking. Get help right away if:  You have chest pain.  You have trouble breathing. This information is not intended to replace advice given to you by your health care provider. Make sure you discuss any questions you have with your health care provider. Document Revised: 03/16/2017 Document Reviewed: 09/11/2015 Elsevier Patient Education  [place].

## 2020-01-01 NOTE — Progress Notes (Signed)
Chief Complaint  Patient presents with  . Follow-up   F/u  1. Elevated BP today had mt dew and crystal light with caffeine and dayquil last week for the last 2 weeks  2. Chest congestion and sinusitis has dogs and worse since dogs tried dayquil/nyquil severe cold and flu cough improved no fever at times had rattling in chest no h/o asthma  3. 2 weeks ago used new shampoo otc and had itching scalp neck and face and rash   Review of Systems  Constitutional: Negative for weight loss.  HENT: Negative for hearing loss.   Eyes: Negative for blurred vision.  Respiratory: Negative for shortness of breath.   Cardiovascular: Negative for chest pain.  Musculoskeletal: Negative for falls.  Skin: Positive for itching and rash.  Neurological: Negative for dizziness and headaches.  Psychiatric/Behavioral: Negative for depression.   Past Medical History:  Diagnosis Date  . Anemia   . Depression   . Dysmenorrhea   . Fibroid   . History of blood transfusion 09/2016   [facility] - 2 units transfused  . Insomnia   . Iron deficiency anemia   . Stress    Past Surgical History:  Procedure Laterality Date  . DILATATION & CURETTAGE/HYSTEROSCOPY WITH MYOSURE N/A 11/13/2016   Procedure: DILATATION & CURETTAGE/HYSTEROSCOPY WITH MYOSURE;  Surgeon: Salvadore Dom, MD;  Location: [facility];  Service: Gynecology;  Laterality: N/A;  . DILATION AND CURETTAGE OF UTERUS    . OTHER SURGICAL HISTORY     cervical polyp removal    Family History  Problem Relation Age of Onset  . Diabetes Mother   . Hypertension Mother   . Arthritis Mother   . Asthma Mother   . COPD Father   . Breast cancer Maternal Grandmother        mat great gm   Social History   Socioeconomic History  . Marital status: Single    Spouse name: Not on file  . Number of children: Not on file  . Years of education: Not on file  . Highest education level: Not on file  Occupational History  . Not on file  Tobacco Use  . Smoking status:  Never Smoker  . Smokeless tobacco: Never Used  Vaping Use  . Vaping Use: Never used  Substance and Sexual Activity  . Alcohol use: No  . Drug use: No  . Sexual activity: Not Currently    Birth control/protection: Pill  Other Topics Concern  . 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 Difficulty of Paying Living Expenses: Not on file  Food Insecurity:   . Worried About Charity fundraiser in the Last Year: Not on file  . Ran Out of Food in the Last Year: Not on file  Transportation Needs:   . Lack of Transportation (Medical): Not on file  . Lack of Transportation (Non-Medical): Not on file  Physical Activity:   . Days of Exercise per Week: Not on file  . Minutes of Exercise per Session: Not on file  Stress:   . Feeling of Stress : Not on file  Social Connections:   . Frequency of Communication with Friends and Family: Not on file  . Frequency of Social Gatherings with Friends and Family: Not on file  . Attends Religious Services: Not on file  . Active Member of Clubs or Organizations: Not on file  . Attends Archivist Meetings: Not on file  . Marital Status: Not on file  Intimate Partner Violence:   . Fear of Current or Ex-Partner: Not on file  . Emotionally Abused: Not on file  . Physically Abused: Not on file  . Sexually Abused: Not on file   Current Meds  Medication Sig  . acetaminophen (TYLENOL) 650 MG CR tablet Take 650 mg by mouth every 8 (eight) hours as needed for pain or fever.  . folic acid (FOLVITE) 1 MG tablet TAKE 1 TABLET BY MOUTH EVERY DAY  . furosemide (LASIX) 20 MG tablet Take 1 tablet (20 mg total)  by mouth daily as needed.  Marland Kitchen ibuprofen (ADVIL) 800 MG tablet Take 1 tablet (800 mg total) by mouth every 8 (eight) hours as needed.  Marland Kitchen ipratropium (ATROVENT) 0.06 % nasal spray Place 2 sprays into both nostrils 3 (three) times daily.  Marland Kitchen levonorgestrel-ethinyl estradiol (LARISSIA) 0.1-20 MG-MCG tablet TAKE 1 TABLET BY MOUTH DAILY. SKIP PLACEBO WEEK, TAKE CONTINUOUSLY.  Marland Kitchen loratadine (CLARITIN) 10 MG tablet Take 1 tablet (10 mg total) by mouth daily as needed for allergies.  . [DISCONTINUED] Cholecalciferol (VITAMIN D3) 125 MCG (5000 UT) TABS Take by mouth daily.   No Known Allergies Recent Results (from the past 2160 hour(s))  Vitamin D (25 hydroxy)     Status: Abnormal   Collection Time: 11/13/19  8:02 AM  Result Value Ref Range   VITD 20.77 (L) 30.00 - 100.00 ng/mL  Urinalysis, Routine w reflex microscopic     Status: Abnormal   Collection Time: 11/13/19  8:02 AM  Result Value Ref Range   Color, Urine YELLOW YELLOW   APPearance CLEAR CLEAR   Specific Gravity, Urine 1.020 1.001 - 1.03   pH < OR = 5.0 5.0 - 8.0   Glucose, UA NEGATIVE NEGATIVE   Bilirubin Urine NEGATIVE NEGATIVE   Ketones, ur NEGATIVE NEGATIVE   Hgb urine dipstick TRACE (A) NEGATIVE   Protein, ur NEGATIVE NEGATIVE   Nitrite NEGATIVE NEGATIVE   Leukocytes,Ua TRACE (A) NEGATIVE   WBC, UA 0-5 0 - 5 /HPF   RBC / HPF 0-2 0 - 2 /HPF   Squamous Epithelial / LPF 0-5 < OR = 5 /HPF   Bacteria, UA NONE SEEN NONE SEEN /HPF   Calcium Oxalate Crystal MODERATE (A) NONE OR FE /HPF   Hyaline Cast NONE SEEN NONE SEEN /LPF   Urine-Other FEW MUCOUS THREADS   TSH     Status: None   Collection Time: 11/13/19  8:02 AM  Result Value Ref Range   TSH 2.78 0.35 - 4.50 uIU/mL  Lipid panel     Status: Abnormal   Collection Time: 11/13/19  8:02 AM  Result Value Ref Range   Cholesterol 225 (H) 0 - 200 mg/dL    Comment: ATP III Classification       Desirable:  < 200 mg/dL               Borderline High:  200 - 239 mg/dL          High:  > = 240  mg/dL   Triglycerides 105.0 0 -  149 mg/dL    Comment: Normal:  <150 mg/dLBorderline High:  150 - 199 mg/dL   HDL 72.70 >39.00 mg/dL   VLDL 21.0 0.0 - 40.0 mg/dL   LDL Cholesterol 131 (H) 0 - 99 mg/dL   Total CHOL/HDL Ratio 3     Comment:                Men          Women1/2 Average Risk     3.4          3.3Average Risk          5.0          4.42X Average Risk          9.6          7.13X Average Risk          15.0          11.0                       NonHDL 151.92     Comment: NOTE:  Non-HDL goal should be 30 mg/dL higher than patient's LDL goal (i.e. LDL goal of < 70 mg/dL, would have non-HDL goal of < 100 mg/dL)  Comprehensive metabolic panel     Status: Abnormal   Collection Time: 11/13/19  8:02 AM  Result Value Ref Range   Sodium 136 135 - 145 mEq/L   Potassium 4.3 3.5 - 5.1 mEq/L   Chloride 108 96 - 112 mEq/L   CO2 20 19 - 32 mEq/L   Glucose, Bld 108 (H) 70 - 99 mg/dL   BUN 10 6 - 23 mg/dL   Creatinine, Ser 0.71 0.40 - 1.20 mg/dL   Total Bilirubin 0.4 0.2 - 1.2 mg/dL   Alkaline Phosphatase 111 39 - 117 U/L   AST 14 0 - 37 U/L   ALT 12 0 - 35 U/L   Total Protein 6.9 6.0 - 8.3 g/dL   Albumin 3.9 3.5 - 5.2 g/dL   GFR 108.72 >60.00 mL/min   Calcium 9.7 8.4 - 10.5 mg/dL  CBC with Differential (Cancer Center Only)     Status: None   Collection Time: 12/11/19  2:04 PM  Result Value Ref Range   WBC Count 4.2 4.0 - 10.5 K/uL   RBC 4.69 3.87 - 5.11 MIL/uL   Hemoglobin 14.4 12.0 - 15.0 g/dL   HCT 40.6 36 - 46 %   MCV 86.6 80.0 - 100.0 fL   MCH 30.7 26.0 - 34.0 pg   MCHC 35.5 30.0 - 36.0 g/dL   RDW 12.3 11.5 - 15.5 %   Platelet Count 230 150 - 400 K/uL   nRBC 0.0 0.0 - 0.2 %   Neutrophils Relative % 49 %   Neutro Abs 2.0 1.7 - 7.7 K/uL   Lymphocytes Relative 40 %   Lymphs Abs 1.7 0.7 - 4.0 K/uL   Monocytes Relative 8 %   Monocytes Absolute 0.3 0.1 - 1.0 K/uL   Eosinophils Relative 2 %   Eosinophils Absolute 0.1 0 - 0 K/uL   Basophils Relative 1 %   Basophils Absolute 0.0 0 -  0 K/uL   Immature Granulocytes 0 %   Abs Immature Granulocytes 0.01 0.00 - 0.07 K/uL    Comment: Performed at [facility] at [facility], [address], [place], [place] [address]  Iron and TIBC     Status: None   Collection  Time: 12/11/19  2:05 PM  Result Value Ref Range   Iron 127 41 - 142 ug/dL   TIBC 359 236 - 444 ug/dL   Saturation Ratios 35 21 - 57 %   UIBC 232 120 - 384 ug/dL    Comment: Performed at [facility], [place] [address], [place], [place] [address]  Ferritin     Status: None   Collection Time: 12/11/19  2:05 PM  Result Value Ref Range   Ferritin 129 11 - 307 ng/mL    Comment: Performed at [facility], [place] [address], [place], [place] [address]  Reticulocytes     Status: None   Collection Time: 12/11/19  2:05 PM  Result Value Ref Range   Retic Ct Pct 2.2 0.4 - 3.1 %   RBC. 4.71 3.87 - 5.11 MIL/uL   Retic Count, Absolute 101.7 19.0 - 186.0 K/uL   Immature Retic Fract 7.6 2.3 - 15.9 %    Comment: Performed at [facility] at [facility], [address], [place], [place] [address]   Objective  Body mass index is 40.22 kg/m. Wt Readings from Last 3 Encounters:  01/01/20 249 lb 3.2 oz (113 kg)  12/11/19 250 lb 12.8 oz (113.8 kg)  11/06/19 258 lb (117 kg)   Temp Readings from Last 3 Encounters:  01/01/20 98.4 F (36.9 C) (Oral)  12/11/19 98.3 F (36.8 C) (Oral)  06/26/19 (!) 97 F (36.1 C) (Temporal)   BP Readings from Last 3 Encounters:  01/01/20 140/88  12/11/19 130/70  11/06/19 136/78   Pulse Readings from Last 3 Encounters:  01/01/20 88  12/11/19 70  11/06/19 82    Physical Exam Vitals and nursing note reviewed.  Constitutional:      Appearance: Normal appearance. She is well-developed and well-groomed. She is obese.  HENT:     Head: Normocephalic and atraumatic.  Eyes:     Conjunctiva/sclera: Conjunctivae normal.     Pupils: Pupils are  equal, round, and reactive to light.  Cardiovascular:     Rate and Rhythm: Normal rate and regular rhythm.     Heart sounds: Normal heart sounds. No murmur heard.   Pulmonary:     Effort: Pulmonary effort is normal.     Breath sounds: Normal breath sounds.  Skin:    General: Skin is warm and dry.     Comments: seb derm and contact derm to face/neck/scalp  Neurological:     General: No focal deficit present.     Mental Status: She is alert and oriented to person, place, and time. Mental status is at baseline.     Gait: Gait normal.  Psychiatric:        Attention and Perception: Attention and perception normal.        Mood and Affect: Mood and affect normal.        Speech: Speech normal.        Behavior: Behavior normal. Behavior is cooperative.        Thought Content: Thought content normal.        Cognition and Memory: Cognition and memory normal.        Judgment: Judgment normal.     Assessment  Plan  Bronchitis - Plan: azithromycin (ZITHROMAX) 250 MG tablet, albuterol (VENTOLIN HFA) 108 (90 Base) MCG/ACT inhaler, montelukast (SINGULAIR) 10 MG tablet  Frontal sinusitis, unspecified chronicity - Plan: azithromycin (ZITHROMAX) 250 MG tablet, montelukast (SINGULAIR) 10 MG tablet  Allergic rhinitis,  unspecified seasonality, unspecified trigger - Plan: montelukast (SINGULAIR) 10 MG tablet  Vitamin D deficiency - Plan: Cholecalciferol 1.25 MG (50000 UT) capsule  Allergic contact dermatitis, unspecified trigger - Plan: hydrocortisone 2.5 % ointment  Seborrheic dermatitis - Plan: hydrocortisone 2.5 % ointment, ketoconazole (NIZORAL) 2 % shampoo  HM Declines flu shot  Tdaputd ? If will get covid vaccine Pfizer  Hep B vaccine immune HPV 1/3  Declines HIV check   09/21/16 pap neg neg HPV ob/gyn sch 10/2020 saw 10/2019  Mammogram neg 9/24/20ordered for 2021 will call to schedule   Colonoscopy consider age 29  consider hiv/hep C   Vit D defrecheck vitamin D on D3 5000  IU qd   rec healthy diet and exercise   Provider: Dr. Olivia Mackie McLean-Scocuzza-Internal Medicine

## 2020-01-19 ENCOUNTER — Encounter: Payer: Self-pay | Admitting: Internal Medicine

## 2020-01-21 DIAGNOSIS — I10 Essential (primary) hypertension: Secondary | ICD-10-CM | POA: Insufficient documentation

## 2020-01-21 MED ORDER — LOSARTAN POTASSIUM-HCTZ 50-12.5 MG PO TABS: 0.5000 | ORAL_TABLET | Freq: Every day | ORAL | 1 refills | Status: DC

## 2020-01-21 NOTE — Addendum Note (Signed)
Addended by: Orland Mustard on: 01/21/2020 07:49 AM   Modules accepted: Orders

## 2020-03-11 ENCOUNTER — Encounter: Payer: Self-pay | Admitting: Internal Medicine

## 2020-03-11 ENCOUNTER — Ambulatory Visit: Payer: PRIVATE HEALTH INSURANCE | Admitting: Internal Medicine

## 2020-03-11 ENCOUNTER — Other Ambulatory Visit: Payer: Self-pay

## 2020-03-11 VITALS — BP 136/86 | HR 101 | Temp 98.6°F | Ht 66.0 in | Wt 247.6 lb

## 2020-03-11 DIAGNOSIS — L219 Seborrheic dermatitis, unspecified: Secondary | ICD-10-CM | POA: Insufficient documentation

## 2020-03-11 DIAGNOSIS — J321 Chronic frontal sinusitis: Secondary | ICD-10-CM

## 2020-03-11 DIAGNOSIS — I10 Essential (primary) hypertension: Secondary | ICD-10-CM

## 2020-03-11 DIAGNOSIS — J40 Bronchitis, not specified as acute or chronic: Secondary | ICD-10-CM

## 2020-03-11 DIAGNOSIS — J040 Acute laryngitis: Secondary | ICD-10-CM | POA: Diagnosis not present

## 2020-03-11 DIAGNOSIS — J309 Allergic rhinitis, unspecified: Secondary | ICD-10-CM

## 2020-03-11 LAB — BASIC METABOLIC PANEL
BUN: 11 mg/dL (ref 6–23)
CO2: 24 mEq/L (ref 19–32)
Calcium: 10.1 mg/dL (ref 8.4–10.5)
Chloride: 108 mEq/L (ref 96–112)
Creatinine, Ser: 0.69 mg/dL (ref 0.40–1.20)
GFR: 106.37 mL/min (ref >60.00)
Glucose, Bld: 153 mg/dL — ABNORMAL HIGH (ref 70–99)
Potassium: 3.9 mEq/L (ref 3.5–5.1)
Sodium: 138 mEq/L (ref 135–145)

## 2020-03-11 MED ORDER — LOSARTAN POTASSIUM-HCTZ 50-12.5 MG PO TABS: 1.0000 | ORAL_TABLET | Freq: Every day | ORAL | 3 refills | Status: DC

## 2020-03-11 MED ORDER — AZITHROMYCIN 250 MG PO TABS: ORAL_TABLET | ORAL | 0 refills | Status: DC

## 2020-03-11 NOTE — Progress Notes (Signed)
Chief Complaint  Patient presents with  . Follow-up   Fu  1. seb derm cleared with hc and nizoral shampoo  2. Hoarse voice since last night and Tuesday had sore throat tried theraflu denies covid testing and has been wearing a mask. She reports last week loading truck in cold and this happened after then and happened in 12/2019 on generic claritin, has prn albuterol  Sore throat resolved tried theraflu as well otc and she still has a slight cough  She does have 4 dogs in the house  Her mom has asthma but she has never been dx'ed asthma  3. HTN elevated on 1/2 dose losartan hctz 50-12.5 BP in sbp 140s    Review of Systems  Constitutional: Negative for weight loss.  HENT: Negative for hearing loss and sore throat.        +hoarse voice  Eyes: Negative for blurred vision.  Respiratory: Positive for cough. Negative for sputum production.   Cardiovascular: Negative for chest pain.  Skin: Negative for rash.  Neurological: Negative for headaches.  Psychiatric/Behavioral: Negative for depression.   Past Medical History:  Diagnosis Date  . Anemia   . Depression   . Dysmenorrhea   . Fibroid   . History of blood transfusion 09/2016   [facility] - 2 units transfused  . Insomnia   . Iron deficiency anemia   . Stress    Past Surgical History:  Procedure Laterality Date  . DILATATION & CURETTAGE/HYSTEROSCOPY WITH MYOSURE N/A 11/13/2016   Procedure: DILATATION & CURETTAGE/HYSTEROSCOPY WITH MYOSURE;  Surgeon: Salvadore Dom, MD;  Location: [facility];  Service: Gynecology;  Laterality: N/A;  . DILATION AND CURETTAGE OF UTERUS    . OTHER SURGICAL HISTORY     cervical polyp removal    Family History  Problem Relation Age of Onset  . Diabetes Mother   . Hypertension Mother   . Arthritis Mother   . Asthma Mother   . COPD Father   . Breast cancer Maternal Grandmother        mat great gm   Social History   Socioeconomic History  . Marital status: Single    Spouse name: Not on file  .  Number of children: Not on file  . Years of education: Not on file  . Highest education level: Not on file  Occupational History  . Not on file  Tobacco Use  . Smoking status: Never Smoker  . Smokeless tobacco: Never Used  Vaping Use  . Vaping Use: Never used  Substance and Sexual Activity  . Alcohol use: No  . Drug use: No  . Sexual activity: Not Currently    Birth control/protection: Pill  Other Topics Concern  . 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  acetaminophen (TYLENOL) 650 MG CR tablet Take 650 mg by mouth every 8 (eight) hours as needed for pain or fever.  Marland Kitchen albuterol (VENTOLIN HFA) 108 (90 Base) MCG/ACT inhaler Inhale 1-2 puffs into the lungs every 6 (six) hours as needed for wheezing or shortness of breath.  . Cholecalciferol 1.25 MG (50000 UT) capsule Take 1 capsule (50,000 Units total) by mouth once a week. Vitamin D3 D/c after 6 months but change to 50K IU 1x per month  . folic acid (FOLVITE) 1 MG tablet TAKE 1 TABLET BY MOUTH EVERY DAY  . hydrocortisone 2.5 % ointment Apply topically 2 (two) times daily.  Marland Kitchen ibuprofen (ADVIL) 800 MG tablet Take 1 tablet (800 mg total) by mouth every 8 (eight) hours as needed.  Marland Kitchen ipratropium (ATROVENT) 0.06 % nasal spray Place 2 sprays into both nostrils 3 (three) times daily.  Marland Kitchen ketoconazole (NIZORAL) 2 % shampoo Apply 1 application topically 2 (two)  times a week. Let sit 5 minutes  . levonorgestrel-ethinyl estradiol (LARISSIA) 0.1-20 MG-MCG tablet TAKE 1 TABLET BY MOUTH DAILY. SKIP PLACEBO WEEK, TAKE CONTINUOUSLY.  Marland Kitchen loratadine (CLARITIN) 10 MG tablet Take 1 tablet (10 mg total) by mouth daily as needed for allergies.  . montelukast (SINGULAIR) 10 MG tablet Take 1 tablet (10 mg total) by mouth at bedtime.  . [DISCONTINUED] azithromycin (ZITHROMAX) 250 MG tablet 2 pills day 1 and 1 pill day 2-5  . [DISCONTINUED] losartan-hydrochlorothiazide (HYZAAR) 50-12.5 MG tablet Take 0.5 tablets by mouth daily. In am please cut pill in 1/2 for patient. D/c as needed lasix   No Known Allergies No results found for this or any previous visit (from the past 2160 hour(s)). Objective  Body mass index is 39.96 kg/m. Wt Readings from Last 3 Encounters:  03/11/20 247 lb 9.6 oz (112.3 kg)  01/01/20 249 lb 3.2 oz (113 kg)  12/11/19 250 lb 12.8 oz (113.8 kg)   Temp Readings from Last 3 Encounters:  03/11/20 98.6 F (37 C) (Oral)  01/01/20 98.4 F (36.9 C) (Oral)  12/11/19 98.3 F (36.8 C) (Oral)   BP Readings from Last 3 Encounters:  03/11/20 136/86  01/01/20 140/88  12/11/19 130/70   Pulse Readings from Last 3 Encounters:  03/11/20 (!) 101  01/01/20 88  12/11/19 70    Physical Exam Vitals and nursing note reviewed.  Constitutional:      Appearance: Normal appearance. She is well-developed and well-groomed. She is obese.  HENT:     Head: Normocephalic and atraumatic.     Mouth/Throat:     Comments: +hoarse voice Eyes:     Conjunctiva/sclera: Conjunctivae normal.     Pupils: Pupils are equal, round, and reactive to light.  Cardiovascular:     Rate and Rhythm: Normal rate and regular rhythm.     Heart sounds: Normal heart sounds. No murmur heard.   Pulmonary:     Effort: Pulmonary effort is normal.     Breath sounds: Normal breath sounds.  Skin:    General: Skin is warm and dry.  Neurological:     General: No focal deficit  present.     Mental Status: She is alert and oriented to person, place, and time. Mental status is at baseline.     Gait: Gait normal.  Psychiatric:        Attention and Perception: Attention and perception normal.        Mood and Affect: Mood and affect normal.        Speech: Speech normal.  Behavior: Behavior normal. Behavior is cooperative.        Thought Content: Thought content normal.        Cognition and Memory: Cognition and memory normal.        Judgment: Judgment normal.     Assessment  Plan  Hypertension, unspecified type - Plan: losartan-hydrochlorothiazide (HYZAAR) 50-12.5 MG tablet, Basic metabolic panel Monitor BP  Bronchitis - Plan: azithromycin (ZITHROMAX) 250 MG tablet, Ambulatory referral to Allergy Frontal sinusitis, unspecified chronicity - Plan: azithromycin (ZITHROMAX) 250 MG tablet x 5 days  Laryngitis - Plan: Ambulatory referral to Allergy Allergic rhinitis, unspecified seasonality, unspecified trigger - Plan: Ambulatory referral to Allergy  Seborrheic dermatitis  nizoral shampoo and hc   HM Declines flu shot/covid Tdaputd Hep B vaccineimmune HPV 1/3  Declines HIV check  09/21/16 pap neg neg HPV ob/gynsch 10/2020 saw 10/2019  Mammogram neg9/24/20orderedfor 2021 will call to schedule   Colonoscopy consider age 27 consider hiv/hep C   Vit D defrecheck vitamin Don D3 5000 IU qd   rec healthy diet and exercise  Provider: Dr. Olivia Mackie McLean-Scocuzza-Internal Medicine

## 2020-03-11 NOTE — Patient Instructions (Addendum)
Referred to allergy test   Laryngitis  Laryngitis is inflammation of the vocal cords that causes symptoms such as hoarseness or loss of voice. The vocal cords are two bands of muscles in your throat. When you speak, these cords come together and vibrate. The vibrations come out through your mouth as sound. When your vocal cords are inflamed, your voice sounds different. Laryngitis can be temporary (acute) or long-term (chronic). Most cases of acute laryngitis improve with time. Chronic laryngitis is laryngitis that lasts for more than 3 weeks. What are the causes? Acute laryngitis may be caused by:  A viral infection.  Lots of talking, yelling, or singing. This is also called vocal strain.  A bacterial infection. Chronic laryngitis may be caused by:  Vocal strain.  Injury to your vocal cords.  Acid reflux (gastroesophageal reflux disease, or GERD).  Allergies.  A sinus infection.  Smoking.  Alcohol abuse.  Breathing in chemicals or dust.  Growths on the vocal cords. What increases the risk? The following factors may make you more likely to develop this condition:  Smoking.  Alcohol abuse.  Having allergies.  Chronic irritants in the workplace, such as toxic fumes. What are the signs or symptoms? Symptoms of this condition may include:  Low, hoarse voice.  Loss of voice.  Dry cough.  Sore or dry throat.  Stuffy nose. How is this diagnosed? This condition may be diagnosed based on:  Your symptoms and a physical exam.  Throat culture.  Blood test.  A procedure in which your health care provider looks at your vocal cords with a mirror or viewing tube (laryngoscopy). How is this treated? Treatment for laryngitis depends on what is causing it.  Usually, treatment involves resting your voice and using medicines to soothe your throat.  If your laryngitis is caused by a bacterial infection, you may need to take antibiotic medicine.  If your laryngitis is  caused by a growth, you may need to have a procedure to remove it. Follow these instructions at home: Medicines  Take over-the-counter and prescription medicines only as told by your health care provider.  If you were prescribed an antibiotic medicine, take it as told by your health care provider. Do not stop taking the antibiotic even if you start to feel better. General instructions  Talk as little as possible. Also avoid whispering, which can cause vocal strain.  Write instead of talking. Do this until your voice is back to normal.  Drink enough fluid to keep your urine pale yellow.  Breathe in moist air. Use a humidifier if you live in a dry climate.  Do not use any products that contain nicotine or tobacco, such as cigarettes and e-cigarettes. If you need help quitting, ask your health care provider. Contact a health care provider if:  You have a fever.  You have increasing pain.  Your symptoms do not get better in 2 weeks. Get help right away if:  You cough up blood.  You have difficulty swallowing.  You have trouble breathing. Summary  Laryngitis is inflammation of the vocal cords that causes symptoms such as hoarseness or loss of voice.  Laryngitis can be temporary (acute) or long-term (chronic).  Treatment for laryngitis depends on the cause. It often involves resting your voice and using medicine to soothe your throat. This information is not intended to replace advice given to you by your health care provider. Make sure you discuss any questions you have with your health care provider. Document Revised: 02/28/2017  Document Reviewed: 02/28/2017 Elsevier Patient Education  [employer].

## 2020-06-15 ENCOUNTER — Other Ambulatory Visit: Payer: Self-pay | Admitting: Internal Medicine

## 2020-06-15 DIAGNOSIS — E559 Vitamin D deficiency, unspecified: Secondary | ICD-10-CM

## 2020-06-22 ENCOUNTER — Telehealth: Payer: Self-pay | Admitting: Internal Medicine

## 2020-06-22 NOTE — Telephone Encounter (Signed)
Noted, Patient will call medical records.   If Patient calls back will need to know what needs to be released and who it needs to be sent to. Nothing documented in her chart about medical record release.

## 2020-06-22 NOTE — Telephone Encounter (Signed)
Did Patient state what needs to be released and who this needs to be released to?

## 2020-06-22 NOTE — Telephone Encounter (Signed)
No she stated someone told her to call the number here and speak with Stacy Miles and tell her that she give verbal premission for medical records to be release  I did give her the number to medical records

## 2020-06-22 NOTE — Telephone Encounter (Signed)
Patient called in and gave verbal permission to release medical records

## 2020-06-29 IMAGING — MG MM DIGITAL SCREENING BILAT W/ TOMO W/ CAD
8 series · 8 of 24 positions shown · non-contrast
Comparison: Previous exam(s).

CLINICAL DATA: Screening.

EXAM:
DIGITAL SCREENING BILATERAL MAMMOGRAM WITH TOMO AND CAD

[R CC synth-2D]
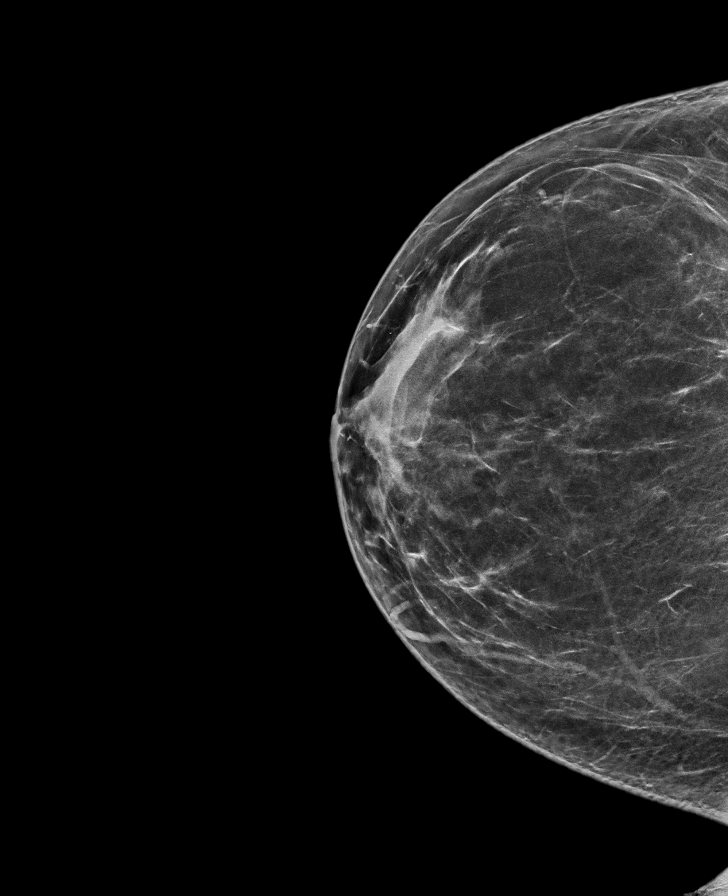

[L MLO synth-2D]
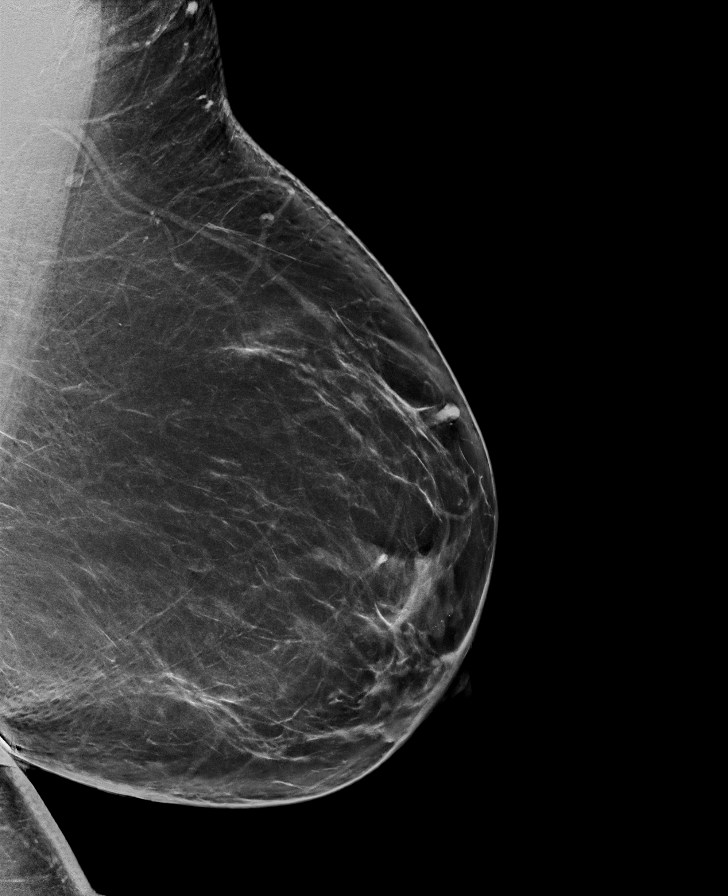

[L CC synth-2D]
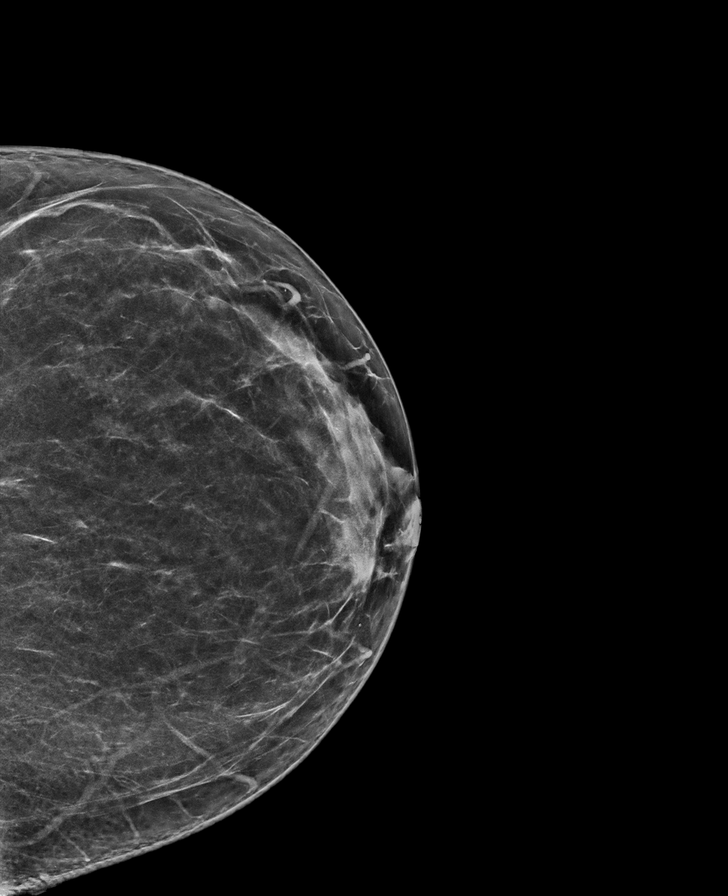

[R MLO synth-2D]
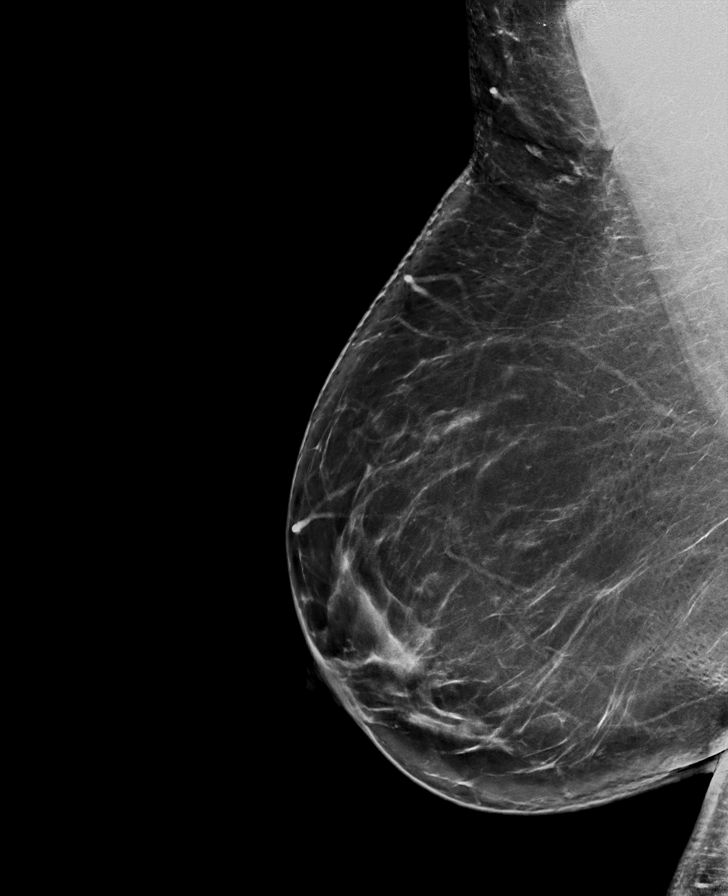

[L MLO tomo · tomo slice 47/94.0]
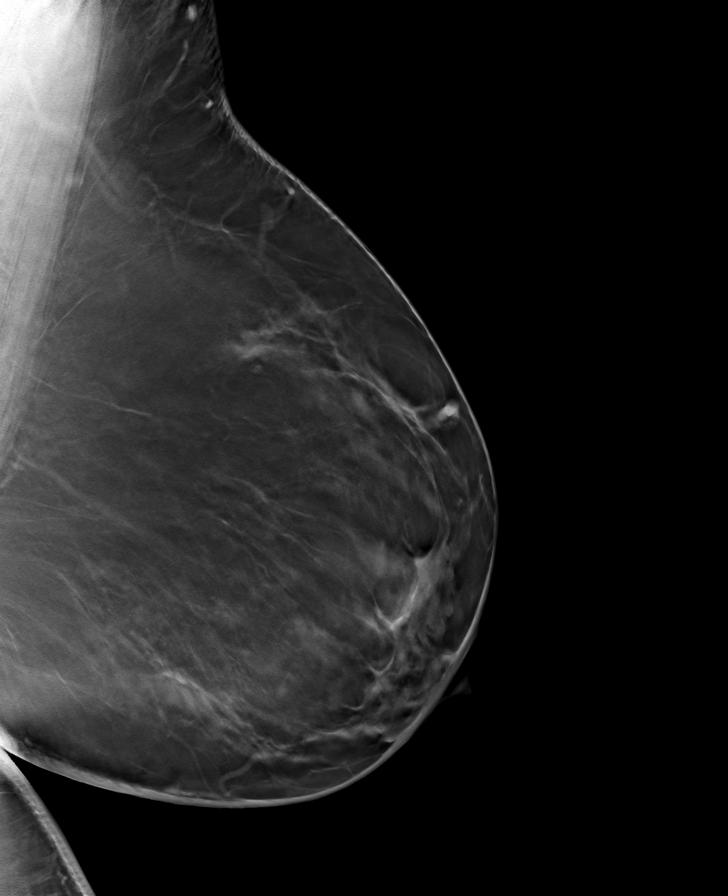

[R CC tomo · tomo slice 38/75.0]
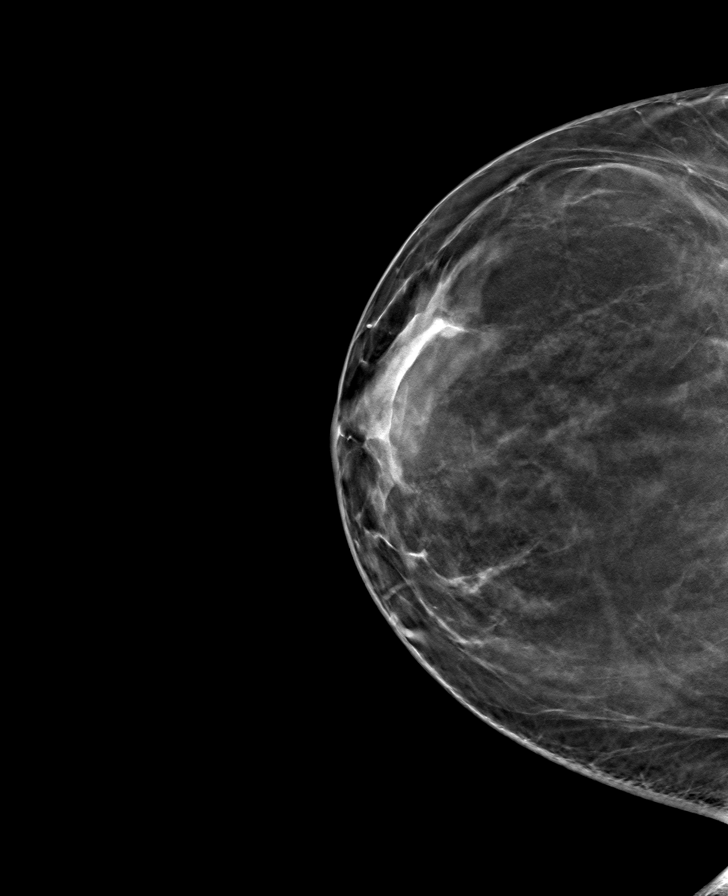

[L CC tomo · tomo slice 39/76.0]
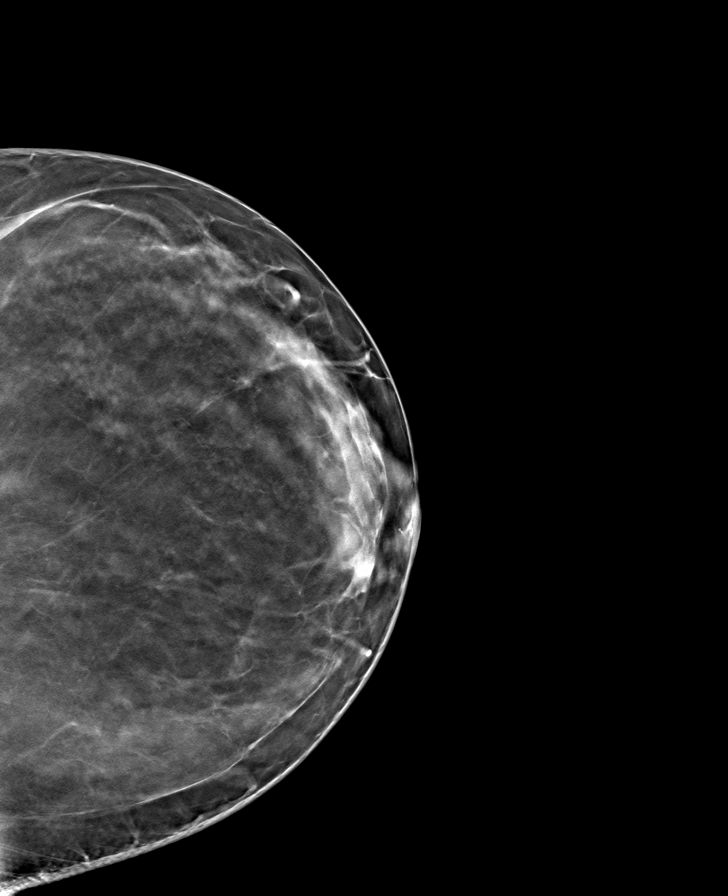

[R MLO tomo · tomo slice 49/98.0]
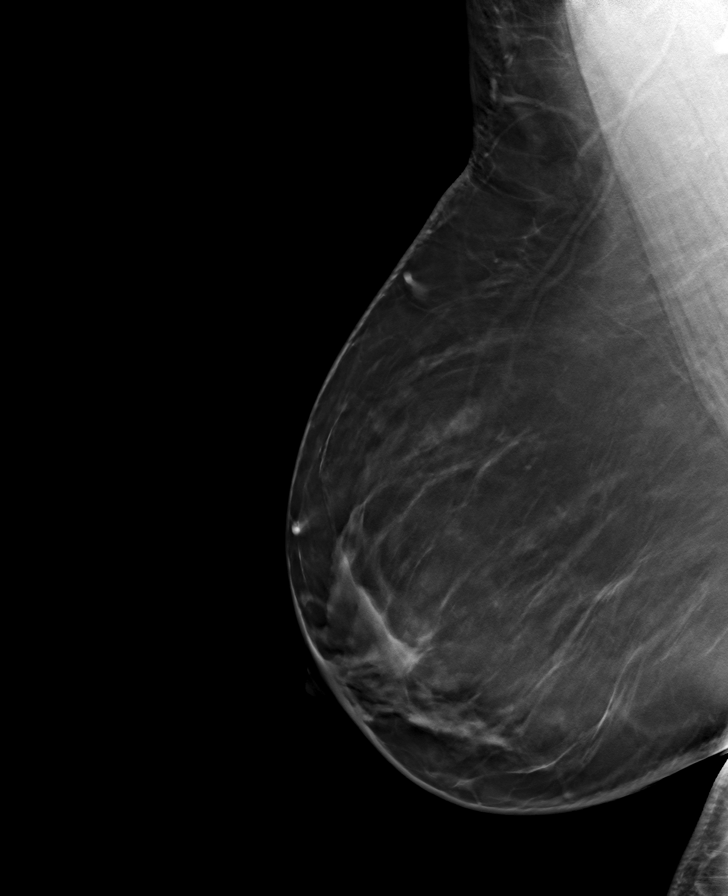

[8 of 24 positions shown; findings below may reference images not displayed]

ACR Breast Density Category b: There are scattered areas of
fibroglandular density.
FINDINGS: There are no findings suspicious for malignancy. Images were
processed with CAD.
IMPRESSION: No mammographic evidence of malignancy. A result letter of this
screening mammogram will be mailed directly to the patient.

RECOMMENDATION:
Screening mammogram in one year. (Code:[id number])

BI-RADS CATEGORY  1: Negative.

## 2020-07-01 ENCOUNTER — Ambulatory Visit: Payer: PRIVATE HEALTH INSURANCE | Admitting: Internal Medicine

## 2020-07-16 ENCOUNTER — Other Ambulatory Visit: Payer: Self-pay | Admitting: Internal Medicine

## 2020-07-16 DIAGNOSIS — I10 Essential (primary) hypertension: Secondary | ICD-10-CM

## 2020-07-29 ENCOUNTER — Encounter: Payer: Self-pay | Admitting: Internal Medicine

## 2020-07-29 ENCOUNTER — Ambulatory Visit: Payer: PRIVATE HEALTH INSURANCE | Admitting: Internal Medicine

## 2020-07-29 ENCOUNTER — Other Ambulatory Visit: Payer: Self-pay

## 2020-07-29 VITALS — BP 130/92 | HR 96 | Temp 98.0°F | Ht 66.0 in | Wt 248.2 lb

## 2020-07-29 DIAGNOSIS — Z0000 Encounter for general adult medical examination without abnormal findings: Secondary | ICD-10-CM

## 2020-07-29 DIAGNOSIS — E559 Vitamin D deficiency, unspecified: Secondary | ICD-10-CM

## 2020-07-29 DIAGNOSIS — Z1231 Encounter for screening mammogram for malignant neoplasm of breast: Secondary | ICD-10-CM

## 2020-07-29 DIAGNOSIS — I10 Essential (primary) hypertension: Secondary | ICD-10-CM | POA: Diagnosis not present

## 2020-07-29 DIAGNOSIS — R5383 Other fatigue: Secondary | ICD-10-CM

## 2020-07-29 MED ORDER — LOSARTAN POTASSIUM-HCTZ 50-12.5 MG PO TABS: 1.0000 | ORAL_TABLET | Freq: Every day | ORAL | 3 refills | Status: DC

## 2020-07-29 MED ORDER — CHOLECALCIFEROL 1.25 MG (50000 UT) PO CAPS: 50000.0000 [IU] | ORAL_CAPSULE | ORAL | 3 refills | Status: DC

## 2020-07-29 NOTE — Patient Instructions (Signed)
Monitor your blood pressure.

## 2020-07-29 NOTE — Progress Notes (Addendum)
Chief Complaint  Patient presents with  . Follow-up   Annual doing well  1. htn elevated today has not had losartan hctz 50-12.5 mg qd yet taking 1 pill now not 1/2 pill not taking BP at home but will resume not exercising at time eating pizza/processed foods due to work sch   Review of Systems  Constitutional: Negative for weight loss.  HENT: Negative for hearing loss.   Eyes: Negative for blurred vision.  Respiratory: Negative for sputum production.   Cardiovascular: Negative for chest pain.  Gastrointestinal: Negative for abdominal pain.  Musculoskeletal: Negative for falls and joint pain.  Skin: Negative for rash.  Neurological: Negative for headaches.  Psychiatric/Behavioral: Negative for depression.   Past Medical History:  Diagnosis Date  . Anemia   . Depression   . Dysmenorrhea   . Fibroid   . History of blood transfusion 09/2016   [facility] - 2 units transfused  . Insomnia   . Iron deficiency anemia   . Stress    Past Surgical History:  Procedure Laterality Date  . DILATATION & CURETTAGE/HYSTEROSCOPY WITH MYOSURE N/A 11/13/2016   Procedure: DILATATION & CURETTAGE/HYSTEROSCOPY WITH MYOSURE;  Surgeon: Salvadore Dom, MD;  Location: [facility];  Service: Gynecology;  Laterality: N/A;  . DILATION AND CURETTAGE OF UTERUS    . OTHER SURGICAL HISTORY     cervical polyp removal    Family History  Problem Relation Age of Onset  . Diabetes Mother   . Hypertension Mother   . Arthritis Mother   . Asthma Mother   . COPD Father   . Breast cancer Maternal Grandmother        mat great gm   Social History   Socioeconomic History  . Marital status: Single    Spouse name: Not on file  . Number of children: Not on file  . Years of education: Not on file  . Highest education level: Not on file  Occupational History  . Not on file  Tobacco Use  . Smoking status: Never Smoker  . Smokeless tobacco: Never Used  Vaping Use  . Vaping Use: Never used  Substance and Sexual  Activity  . Alcohol use: No  . Drug use: No  . Sexual activity: Not Currently    Birth control/protection: Pill  Other Topics Concern  . 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 acetaminophen (TYLENOL) 650 MG CR tablet Take 650 mg by mouth every 8 (eight) hours as needed for pain or fever.  . folic acid (FOLVITE) 1 MG tablet TAKE 1 TABLET BY MOUTH EVERY DAY  . hydrocortisone 2.5 % ointment Apply topically 2 (two) times daily.  Marland Kitchen ibuprofen (ADVIL) 800 MG tablet Take 1 tablet (800 mg total) by mouth every 8 (eight)  hours as needed.  Marland Kitchen ipratropium (ATROVENT) 0.06 % nasal spray Place 2 sprays into both nostrils 3 (three) times daily.  Marland Kitchen ketoconazole (NIZORAL) 2 % shampoo Apply 1 application topically 2 (two) times a week. Let sit 5 minutes  . levonorgestrel-ethinyl estradiol (LARISSIA) 0.1-20 MG-MCG tablet TAKE 1 TABLET BY MOUTH DAILY. SKIP PLACEBO WEEK, TAKE CONTINUOUSLY.  . [DISCONTINUED] losartan-hydrochlorothiazide (HYZAAR) 50-12.5 MG tablet TAKE 0.5 TABLETS BY MOUTH DAILY. IN AM PLEASE CUT PILL IN 1/2 FOR PATIENT. D/C AS NEEDED LASIX   No Known Allergies No results found for this or any previous visit (from the past 2160 hour(s)). Objective  Body mass index is 40.06 kg/m. Wt Readings from Last 3 Encounters:  07/29/20 248 lb 3.2 oz (112.6 kg)  03/11/20 247 lb 9.6 oz (112.3 kg)  01/01/20 249 lb 3.2 oz  (113 kg)   Temp Readings from Last 3 Encounters:  07/29/20 98 F (36.7 C) (Oral)  03/11/20 98.6 F (37 C) (Oral)  01/01/20 98.4 F (36.9 C) (Oral)   BP Readings from Last 3 Encounters:  07/29/20 (!) 130/92  03/11/20 136/86  01/01/20 140/88   Pulse Readings from Last 3 Encounters:  07/29/20 96  03/11/20 (!) 101  01/01/20 88    Physical Exam Vitals and nursing note reviewed.  Constitutional:      Appearance: Normal appearance. She is well-developed and well-groomed. She is morbidly obese.  HENT:     Head: Normocephalic and atraumatic.  Cardiovascular:     Rate and Rhythm: Normal rate and regular rhythm.     Heart sounds: Normal heart sounds. No murmur heard.   Pulmonary:     Effort: Pulmonary effort is normal.     Breath sounds: Normal breath sounds.  Abdominal:     Tenderness: There is no abdominal tenderness.  Skin:    General: Skin is warm and dry.  Neurological:     General: No focal deficit present.     Mental Status: She is alert and oriented to person, place, and time. Mental status is at baseline.     Gait: Gait normal.  Psychiatric:        Attention and Perception: Attention and perception normal.        Mood and Affect: Mood and affect normal.        Speech: Speech normal.        Behavior: Behavior normal. Behavior is cooperative.        Thought Content: Thought content normal.        Cognition and Memory: Cognition and memory normal.        Judgment: Judgment normal.     Assessment  Plan  Annual physical exam - Plan: Comprehensive metabolic panel, Lipid panel, CBC w/Diff, TSH Declines flu shot/covid Tdaputd Hep B vaccineimmune HPV 1/3 Declines HIV check covid vaccine declines no h/o covid as of 07/29/20   09/21/16 pap neg neg HPV ob/gynsch 10/2020 saw 10/2019 Dr. Talbert Nan  As of 08/04/20 Candee Furbish, For patients between the ages of 44-65, current guidelines are for pap's every 5 years. This is only if the patient has a negative pap, a  negative hpv and no history of high grade cervical dysplasia. It is every 3 years from age's 44 to 57.  Technically Maguire is due for a pap in 44/23. She is scheduled for her annual exam in 8/22. The pap guidelines have gotten very confusing over the last several years. There is an Haematologist which is very helpful. I actually use it all  the time for abnormal paps.  Some insurance companies are not covering pap smears if they are done outside of the guidelines.  Thank you so much for watching out for her. Sharee Pimple  Mammogram neg9/24/20orderedfor 2021will call to schedulechange to [facility]   Colonoscopy consider age 67 consider hiv/hep C  Vit D defrecheck vitamin Don D3 5000 IU qd   rec healthy diet and exercise   Hypertension, uncontrolled no meds today - Plan: losartan-hydrochlorothiazide (HYZAAR) 50-12.5 MG tablet qd , Comprehensive metabolic panel, Lipid panel, CBC w/Diff, TSH  Vitamin D deficiency - Plan: Cholecalciferol 1.25 MG (50000 UT) capsule weekly  Provider: Dr. Olivia Mackie McLean-Scocuzza-Internal Medicine

## 2020-08-12 ENCOUNTER — Ambulatory Visit: Payer: PRIVATE HEALTH INSURANCE | Admitting: Family

## 2020-08-12 ENCOUNTER — Other Ambulatory Visit: Payer: PRIVATE HEALTH INSURANCE

## 2020-09-02 ENCOUNTER — Other Ambulatory Visit (INDEPENDENT_AMBULATORY_CARE_PROVIDER_SITE_OTHER): Payer: PRIVATE HEALTH INSURANCE

## 2020-09-02 ENCOUNTER — Other Ambulatory Visit: Payer: Self-pay

## 2020-09-02 DIAGNOSIS — Z0000 Encounter for general adult medical examination without abnormal findings: Secondary | ICD-10-CM | POA: Diagnosis not present

## 2020-09-02 DIAGNOSIS — R5383 Other fatigue: Secondary | ICD-10-CM | POA: Diagnosis not present

## 2020-09-02 DIAGNOSIS — I10 Essential (primary) hypertension: Secondary | ICD-10-CM | POA: Diagnosis not present

## 2020-09-02 DIAGNOSIS — E559 Vitamin D deficiency, unspecified: Secondary | ICD-10-CM

## 2020-09-02 LAB — COMPREHENSIVE METABOLIC PANEL
ALT: 14 U/L (ref 0–35)
AST: 18 U/L (ref 0–37)
Albumin: 4 g/dL (ref 3.5–5.2)
Alkaline Phosphatase: 113 U/L (ref 39–117)
BUN: 15 mg/dL (ref 6–23)
CO2: 23 mEq/L (ref 19–32)
Calcium: 10.4 mg/dL (ref 8.4–10.5)
Chloride: 105 mEq/L (ref 96–112)
Creatinine, Ser: 0.73 mg/dL (ref 0.40–1.20)
GFR: 100.46 mL/min (ref >60.00)
Glucose, Bld: 93 mg/dL (ref 70–99)
Potassium: 4.2 mEq/L (ref 3.5–5.1)
Sodium: 137 mEq/L (ref 135–145)
Total Bilirubin: 0.5 mg/dL (ref 0.2–1.2)
Total Protein: 7 g/dL (ref 6.0–8.3)

## 2020-09-02 LAB — TSH: TSH: 1.11 u[IU]/mL (ref 0.35–4.50)

## 2020-09-02 LAB — VITAMIN D 25 HYDROXY (VIT D DEFICIENCY, FRACTURES): VITD: 33.7 ng/mL (ref 30.00–100.00)

## 2020-09-02 LAB — CBC WITH DIFFERENTIAL/PLATELET
Basophils Absolute: 0 10*3/uL (ref 0.0–0.1)
Basophils Relative: 0.7 % (ref 0.0–3.0)
Eosinophils Absolute: 0.1 10*3/uL (ref 0.0–0.7)
Eosinophils Relative: 2.6 % (ref 0.0–5.0)
HCT: 42.3 % (ref 36.0–46.0)
Hemoglobin: 14.6 g/dL (ref 12.0–15.0)
Lymphocytes Relative: 49.1 % — ABNORMAL HIGH (ref 12.0–46.0)
Lymphs Abs: 2 10*3/uL (ref 0.7–4.0)
MCHC: 34.6 g/dL (ref 30.0–36.0)
MCV: 89.5 fl (ref 78.0–100.0)
Monocytes Absolute: 0.3 10*3/uL (ref 0.1–1.0)
Monocytes Relative: 8.2 % (ref 3.0–12.0)
Neutro Abs: 1.6 10*3/uL (ref 1.4–7.7)
Neutrophils Relative %: 39.4 % — ABNORMAL LOW (ref 43.0–77.0)
Platelets: 269 10*3/uL (ref 150.0–400.0)
RBC: 4.72 Mil/uL (ref 3.87–5.11)
RDW: 13.2 % (ref 11.5–15.5)
WBC: 4 10*3/uL (ref 4.0–10.5)

## 2020-09-02 LAB — LIPID PANEL
Cholesterol: 223 mg/dL — ABNORMAL HIGH (ref 0–200)
HDL: 79.6 mg/dL (ref >39.00)
LDL Cholesterol: 123 mg/dL — ABNORMAL HIGH (ref 0–99)
NonHDL: 143.08
Total CHOL/HDL Ratio: 3
Triglycerides: 100 mg/dL (ref 0.0–149.0)
VLDL: 20 mg/dL (ref 0.0–40.0)

## 2020-10-24 ENCOUNTER — Other Ambulatory Visit: Payer: Self-pay | Admitting: Internal Medicine

## 2020-10-24 DIAGNOSIS — E559 Vitamin D deficiency, unspecified: Secondary | ICD-10-CM

## 2020-10-26 ENCOUNTER — Other Ambulatory Visit: Payer: Self-pay | Admitting: Internal Medicine

## 2020-10-26 DIAGNOSIS — E559 Vitamin D deficiency, unspecified: Secondary | ICD-10-CM

## 2020-10-26 MED ORDER — CHOLECALCIFEROL 1.25 MG (50000 UT) PO CAPS: 50000.0000 [IU] | ORAL_CAPSULE | ORAL | 3 refills | Status: DC

## 2020-10-26 NOTE — Telephone Encounter (Signed)
Sent in Q30 days D2 ok or D3

## 2020-11-11 ENCOUNTER — Ambulatory Visit: Payer: PRIVATE HEALTH INSURANCE | Admitting: Obstetrics and Gynecology

## 2020-11-24 ENCOUNTER — Other Ambulatory Visit: Payer: Self-pay | Admitting: Obstetrics and Gynecology

## 2020-11-24 NOTE — Telephone Encounter (Signed)
Left message for patient to call.

## 2020-11-24 NOTE — Telephone Encounter (Signed)
I've reviewed her chart and see that she has been diagnosed with hypertension and started on medication since her last visit with me. She needs come off of OCP's, we need to discuss other options. Please try and move her annual exam up to the next few weeks. Please also ask her to schedule a mammogram.

## 2020-11-24 NOTE — Telephone Encounter (Signed)
Annual exam scheduled on 03/17/21,last mammogram was on 9/20

## 2020-11-30 NOTE — Telephone Encounter (Signed)
Patient was advised of Dr. Gentry Fitz recommendation to d/c bcp and move up AEX earlier. Message sent to the appt desk to see if they can get her in in the next few weeks for AEX.  Patient reports she has mammogram scheduled for 12/16/20.

## 2020-12-09 NOTE — Progress Notes (Signed)
45 y.o. G0P0000 Single Black or African American Not Hispanic or Latino female here for annual exam. Patient has stopped her OCP's  about two weeks ago because of diagnosis of hypertension.   H/O fibroid uterus (not deviating her cavity), dysmenorrhea, menorrhagia and severe anemia. No bleeding at all on continuous OCP's.   She hasn't been sexually active in the last year.   No bowel or bladder c/o.   Her Father died in December 06, 2022 at 77. He had heart issues and COPD.  Mom is doing okay. Zyah is struggling, getting a little better. She has good support with her siblings.    No LMP recorded. (Menstrual status: Irregular Periods).          Sexually active: No.  The current method of family planning is none.    Exercising: No.  The patient does not participate in regular exercise at present. Smoker:  no  Health Maintenance: Pap:  09/21/16 [facility] hpv neg,  10/2011 per patient normal  History of abnormal Pap:  no MMG:  12/19/18 density B Bi-rads 1 Neg, scheduled on Thursday.   BMD:   none  Colonoscopy: never  TDaP:  July 2017  Gardasil: x1 11/06/19    reports that she has never smoked. She has never used smokeless tobacco. She reports that she does not drink alcohol and does not use drugs. 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   Past Medical History:  Diagnosis Date   Anemia    Depression    Dysmenorrhea    Fibroid    History of blood transfusion 09/2016   [facility] - 2 units transfused   Insomnia    Iron deficiency anemia    Stress   Hypertension.   Past Surgical History:  Procedure Laterality Date   DILATATION & CURETTAGE/HYSTEROSCOPY WITH MYOSURE N/A 11/13/2016   Procedure: DILATATION & CURETTAGE/HYSTEROSCOPY WITH MYOSURE;  Surgeon: Salvadore Dom, MD;  Location: [facility];  Service: Gynecology;  Laterality: N/A;   DILATION AND CURETTAGE OF UTERUS     OTHER SURGICAL HISTORY     cervical polyp removal     Current Outpatient Medications  Medication Sig Dispense Refill    acetaminophen (TYLENOL) 650 MG CR tablet Take 650 mg by mouth every 8 (eight) hours as needed for pain or fever.     Cholecalciferol 1.25 MG (50000 UT) capsule Take 1 capsule (50,000 Units total) by mouth every 30 (thirty) days. Vitamin D3 4 capsule 3   folic acid (FOLVITE) 1 MG tablet TAKE 1 TABLET BY MOUTH EVERY DAY 90 tablet 3   hydrocortisone 2.5 % ointment Apply topically 2 (two) times daily. 30 g 11   ibuprofen (ADVIL) 800 MG tablet Take 1 tablet (800 mg total) by mouth every 8 (eight) hours as needed. 30 tablet 1   ketoconazole (NIZORAL) 2 % shampoo Apply 1 application topically 2 (two) times a week. Let sit 5 minutes 120 mL 11   losartan-hydrochlorothiazide (HYZAAR) 50-12.5 MG tablet Take 1 tablet by mouth daily. In am 90 tablet 3   No current facility-administered medications for this visit.    Family History  Problem Relation Age of Onset   Diabetes Mother    Hypertension Mother    Arthritis Mother    Asthma Mother    COPD Father    Breast cancer Maternal Grandmother        mat great gm    Review of Systems: on questioning she reports a 2 week h/o vulvar irritation and  itching. No vaginal discharge.   Exam:   BP 136/70   Pulse 84   Ht '5\' 6"'$  (1.676 m)   Wt 254 lb (115.2 kg)   SpO2 98%   BMI 41.00 kg/m   Weight change: '@WEIGHTCHANGE'$ @ Height:   Height: '5\' 6"'$  (167.6 cm)  Ht Readings from Last 3 Encounters:  12/13/20 '5\' 6"'$  (1.676 m)  07/29/20 '5\' 6"'$  (1.676 m)  03/11/20 '5\' 6"'$  (1.676 m)    General appearance: alert, cooperative and appears stated age Head: Normocephalic, without obvious abnormality, atraumatic Neck: no adenopathy, supple, symmetrical, trachea midline and thyroid normal to inspection and palpation Lungs: clear to auscultation bilaterally Cardiovascular: regular rate and rhythm Breasts: normal appearance, no masses or tenderness Abdomen: soft, non-tender; non distended,  no masses,  no organomegaly Extremities: extremities normal, atraumatic, no cyanosis  or edema Skin: Skin color, texture, turgor normal. No rashes or lesions Lymph nodes: Cervical, supraclavicular, and axillary nodes normal. No abnormal inguinal nodes palpated Neurologic: Grossly normal   Pelvic: External genitalia:  no lesions, + erythema, mild whitening, no agglutination              Urethra:  normal appearing urethra with no masses, tenderness or lesions              Bartholins and Skenes: normal                 Vagina: normal appearing vagina with normal color and discharge, no lesions              Cervix: no lesions               Bimanual Exam:  Uterus:   no masses or tenderness              Adnexa: no mass, fullness, tenderness               Rectovaginal: Confirms               Anus:  normal sphincter tone, no lesions  Gae Dry chaperoned for the exam.  1. Well woman exam Discussed breast self exam Discussed calcium and vit D intake Labs with primary Mammogram scheduled Colon cancer screening starting next year  2. General counseling and advice on female contraception Can't be on OCP's, discussed micronor and mirena. She desires micronor - norethindrone (MICRONOR) 0.35 MG tablet; Take 1 tablet (0.35 mg total) by mouth daily.  Dispense: 84 tablet; Refill: 3  3. History of menorrhagia See above - norethindrone (MICRONOR) 0.35 MG tablet; Take 1 tablet (0.35 mg total) by mouth daily.  Dispense: 84 tablet; Refill: 3  4. Screening for cervical cancer - Cytology - PAP  5. Acute vulvitis - WET PREP FOR TRICH, YEAST, CLUE: negative - betamethasone valerate ointment (VALISONE) 0.1 %; Use a pea sized amount topically BID for up to 1-2 weeks as needed  Dispense: 30 g; Refill: 0 -Discussed vulvar skin care

## 2020-12-13 ENCOUNTER — Encounter: Payer: Self-pay | Admitting: Obstetrics and Gynecology

## 2020-12-13 ENCOUNTER — Other Ambulatory Visit (HOSPITAL_COMMUNITY)
Admission: RE | Admit: 2020-12-13 | Discharge: 2020-12-13 | Disposition: A | Discharge status: Home / Self Care | Payer: PRIVATE HEALTH INSURANCE | Source: Ambulatory Visit | Attending: Obstetrics and Gynecology | Admitting: Obstetrics and Gynecology

## 2020-12-13 ENCOUNTER — Ambulatory Visit (INDEPENDENT_AMBULATORY_CARE_PROVIDER_SITE_OTHER): Payer: PRIVATE HEALTH INSURANCE | Admitting: Obstetrics and Gynecology

## 2020-12-13 ENCOUNTER — Other Ambulatory Visit: Payer: Self-pay

## 2020-12-13 VITALS — BP 136/70 | HR 84 | Ht 66.0 in | Wt 254.0 lb

## 2020-12-13 DIAGNOSIS — Z124 Encounter for screening for malignant neoplasm of cervix: Secondary | ICD-10-CM

## 2020-12-13 DIAGNOSIS — Z8742 Personal history of other diseases of the female genital tract: Secondary | ICD-10-CM

## 2020-12-13 DIAGNOSIS — Z01419 Encounter for gynecological examination (general) (routine) without abnormal findings: Secondary | ICD-10-CM

## 2020-12-13 DIAGNOSIS — Z3009 Encounter for other general counseling and advice on contraception: Secondary | ICD-10-CM

## 2020-12-13 DIAGNOSIS — N762 Acute vulvitis: Secondary | ICD-10-CM | POA: Diagnosis not present

## 2020-12-13 LAB — WET PREP FOR TRICH, YEAST, CLUE

## 2020-12-13 MED ORDER — NORETHINDRONE 0.35 MG PO TABS: 1.0000 | ORAL_TABLET | Freq: Every day | ORAL | 3 refills | Status: DC

## 2020-12-13 MED ORDER — BETAMETHASONE VALERATE 0.1 % EX OINT: TOPICAL_OINTMENT | CUTANEOUS | 0 refills | Status: DC

## 2020-12-13 NOTE — Patient Instructions (Addendum)
Managing Loss, Adult People experience loss in many different ways throughout their lives. Events such as moving, changing jobs, and losing friends can create a sense of loss. The loss may be as serious as a major health change, divorce, death of a pet, or death of a loved one. All of these types of loss are likely to create a physical and emotional reaction known as grief. Grief is the result of a major change or an absence of something or someone that you count on. Grief is a normal reaction to loss. A variety of factors can affect your grieving experience, including: The nature of your loss. Your relationship to what or whom you lost. Your understanding of grief and how to manage it. Your support system. How to manage lifestyle changes Keep to your normal routine as much as possible. If you have trouble focusing or doing normal activities, it is acceptable to take some time away from your normal routine. Spend time with friends and loved ones. Eat a healthy diet, get plenty of sleep, and rest when you feel tired. How to recognize changes  The way that you deal with your grief will affect your ability to function as you normally do. When grieving, you may experience these changes: Numbness, shock, sadness, anxiety, anger, denial, and guilt. Thoughts about death. Unexpected crying. A physical sensation of emptiness in your stomach. Problems sleeping and eating. Tiredness (fatigue). Loss of interest in normal activities. Dreaming about or imagining seeing the person who died. A need to remember what or whom you lost. Difficulty thinking about anything other than your loss for a period of time. Relief. If you have been expecting the loss for a while, you may feel a sense of relief when it happens. Follow these instructions at home: Activity Express your feelings in healthy ways, such as: Talking with others about your loss. It may be helpful to find others who have had a similar loss, such  as a support group. Writing down your feelings in a journal. Doing physical activities to release stress and emotional energy. Doing creative activities like painting, sculpting, or playing or listening to music. Practicing resilience. This is the ability to recover and adjust after facing challenges. Reading some resources that encourage resilience may help you to learn ways to practice those behaviors.  General instructions Be patient with yourself and others. Allow the grieving process to happen, and remember that grieving takes time. It is likely that you may never feel completely done with some grief. You may find a way to move on while still cherishing memories and feelings about your loss. Accepting your loss is a process. It can take months or longer to adjust. Keep all follow-up visits as told by your health care provider. This is important. Where to find support To get support for managing loss: Ask your health care provider for help and recommendations, such as grief counseling or therapy. Think about joining a support group for people who are managing a loss. Where to find more information You can find more information about managing loss from: American Society of Clinical Oncology: www.cancer.net American Psychological Association: www.apa.org Contact a health care provider if: Your grief is extreme and keeps getting worse. You have ongoing grief that does not improve. Your body shows symptoms of grief, such as illness. You feel depressed, anxious, or lonely. Get help right away if: You have thoughts about hurting yourself or others. If you ever feel like you may hurt yourself or others, or have   thoughts about taking your own life, get help right away. You can go to your nearest emergency department or call: Your local emergency services (911 in the [place] A suicide crisis helpline, such as the [facility] at 217 486 7356. This is open 24 hours a  day. Summary Grief is the result of a major change or an absence of someone or something that you count on. Grief is a normal reaction to loss. The depth of grief and the period of recovery depend on the type of loss and your ability to adjust to the change and process your feelings. Processing grief requires patience and a willingness to accept your feelings and talk about your loss with people who are supportive. It is important to find resources that work for you and to realize that people experience grief differently. There is not one grieving process that works for everyone in the same way. Be aware that when grief becomes extreme, it can lead to more severe issues like isolation, depression, anxiety, or suicidal thoughts. Talk with your health care provider if you have any of these issues. This information is not intended to replace advice given to you by your health care provider. Make sure you discuss any questions you have with your health care provider. Document Revised: 09/04/2019 Document Reviewed: 09/04/2019 Elsevier Patient Education  [place]   We recommended that you start or continue a regular exercise program for good health. Physical activity is anything that gets your body moving, some is better than none. The CDC recommends 150 minutes per week of Moderate-Intensity Aerobic Activity and 2 or more days of Muscle Strengthening Activity.  Benefits of exercise are limitless: helps weight loss/weight maintenance, improves mood and energy, helps with depression and anxiety, improves sleep, tones and strengthens muscles, improves balance, improves bone density, protects from chronic conditions such as heart disease, high blood pressure and diabetes and so much more. To learn more visit: WhyNotPoker.uy  DIET: Good nutrition starts with a healthy diet of fruits, vegetables, whole grains, and lean protein sources. Drink plenty of water  for hydration. Minimize empty calories, sodium, sweets. For more information about dietary recommendations visit: GeekRegister.com.ee and http://schaefer-mitchell.com/  ALCOHOL:  Women should limit their alcohol intake to no more than 7 drinks/beers/glasses of wine (combined, not each!) per week. Moderation of alcohol intake to this level decreases your risk of breast cancer and liver damage.  If you are concerned that you may have a problem, or your friends have told you they are concerned about your drinking, there are many resources to help. A well-known program that is free, effective, and available to all people all over the nation is Alcoholics Anonymous.  Check out this site to learn more: BlockTaxes.se   CALCIUM AND VITAMIN D:  Adequate intake of calcium and Vitamin D are recommended for bone health.  You should be getting between 1000-1200 mg of calcium and 800 units of Vitamin D daily between diet and supplements  PAP SMEARS:  Pap smears, to check for cervical cancer or precancers,  have traditionally been done yearly, scientific advances have shown that most women can have pap smears less often.  However, every woman still should have a physical exam from her gynecologist every year. It will include a breast check, inspection of the vulva and vagina to check for abnormal growths or skin changes, a visual exam of the cervix, and then an exam to evaluate the size and shape of the uterus and ovaries. We  will also provide age appropriate advice regarding health maintenance, like when you should have certain vaccines, screening for sexually transmitted diseases, bone density testing, colonoscopy, mammograms, etc.   MAMMOGRAMS:  All women over 50 years old should have a routine mammogram.   COLON CANCER SCREENING: Now recommend starting at age 43. At this time colonoscopy is not covered for routine screening until 50. There are take home tests  that can be done between 45-49.   COLONOSCOPY:  Colonoscopy to screen for colon cancer is recommended for all women at age 14.  We know, you hate the idea of the prep.  We agree, BUT, having colon cancer and not knowing it is worse!!  Colon cancer so often starts as a polyp that can be seen and removed at colonscopy, which can quite literally save your life!  And if your first colonoscopy is normal and you have no family history of colon cancer, most women don't have to have it again for 10 years.  Once every ten years, you can do something that may end up saving your life, right?  We will be happy to help you get it scheduled when you are ready.  Be sure to check your insurance coverage so you understand how much it will cost.  It may be covered as a preventative service at no cost, but you should check your particular policy.      Breast Self-Awareness Breast self-awareness means being familiar with how your breasts look and feel. It involves checking your breasts regularly and reporting any changes to your health care provider. Practicing breast self-awareness is important. A change in your breasts can be a sign of a serious medical problem. Being familiar with how your breasts look and feel allows you to find any problems early, when treatment is more likely to be successful. All women should practice breast self-awareness, including women who have had breast implants. How to do a breast self-exam One way to learn what is normal for your breasts and whether your breasts are changing is to do a breast self-exam. To do a breast self-exam: Look for Changes  Remove all the clothing above your waist. Stand in front of a mirror in a room with good lighting. Put your hands on your hips. Push your hands firmly downward. Compare your breasts in the mirror. Look for differences between them (asymmetry), such as: Differences in shape. Differences in size. Puckers, dips, and bumps in one breast and not  the other. Look at each breast for changes in your skin, such as: Redness. Scaly areas. Look for changes in your nipples, such as: Discharge. Bleeding. Dimpling. Redness. A change in position. Feel for Changes Carefully feel your breasts for lumps and changes. It is best to do this while lying on your back on the floor and again while sitting or standing in the shower or tub with soapy water on your skin. Feel each breast in the following way: Place the arm on the side of the breast you are examining above your head. Feel your breast with the other hand. Start in the nipple area and make  inch (2 cm) overlapping circles to feel your breast. Use the pads of your three middle fingers to do this. Apply light pressure, then medium pressure, then firm pressure. The light pressure will allow you to feel the tissue closest to the skin. The medium pressure will allow you to feel the tissue that is a little deeper. The firm pressure will allow you to  feel the tissue close to the ribs. Continue the overlapping circles, moving downward over the breast until you feel your ribs below your breast. Move one finger-width toward the center of the body. Continue to use the  inch (2 cm) overlapping circles to feel your breast as you move slowly up toward your collarbone. Continue the up and down exam using all three pressures until you reach your armpit.  Write Down What You Find  Write down what is normal for each breast and any changes that you find. Keep a written record with breast changes or normal findings for each breast. By writing this information down, you do not need to depend only on memory for size, tenderness, or location. Write down where you are in your menstrual cycle, if you are still menstruating. If you are having trouble noticing differences in your breasts, do not get discouraged. With time you will become more familiar with the variations in your breasts and more comfortable with the  exam. How often should I examine my breasts? Examine your breasts every month. If you are breastfeeding, the best time to examine your breasts is after a feeding or after using a breast pump. If you menstruate, the best time to examine your breasts is 5-7 days after your period is over. During your period, your breasts are lumpier, and it may be more difficult to notice changes. When should I see my health care provider? See your health care provider if you notice: A change in shape or size of your breasts or nipples. A change in the skin of your breast or nipples, such as a reddened or scaly area. Unusual discharge from your nipples. A lump or thick area that was not there before. Pain in your breasts. Anything that concerns you.

## 2020-12-14 LAB — CYTOLOGY - PAP
Comment: NEGATIVE
Diagnosis: NEGATIVE
High risk HPV: NEGATIVE

## 2020-12-16 LAB — HM MAMMOGRAPHY

## 2020-12-23 ENCOUNTER — Encounter: Payer: Self-pay | Admitting: Internal Medicine

## 2021-01-20 ENCOUNTER — Other Ambulatory Visit: Payer: Self-pay | Admitting: Internal Medicine

## 2021-01-20 DIAGNOSIS — E559 Vitamin D deficiency, unspecified: Secondary | ICD-10-CM

## 2021-01-24 ENCOUNTER — Encounter: Payer: Self-pay | Admitting: Internal Medicine

## 2021-02-15 ENCOUNTER — Other Ambulatory Visit: Payer: Self-pay | Admitting: Internal Medicine

## 2021-02-15 DIAGNOSIS — E559 Vitamin D deficiency, unspecified: Secondary | ICD-10-CM

## 2021-03-17 ENCOUNTER — Ambulatory Visit: Payer: PRIVATE HEALTH INSURANCE | Admitting: Obstetrics and Gynecology

## 2021-04-20 ENCOUNTER — Encounter: Payer: Self-pay | Admitting: Obstetrics and Gynecology

## 2021-04-21 ENCOUNTER — Telehealth: Payer: Self-pay | Admitting: Obstetrics and Gynecology

## 2021-04-21 MED ORDER — IBUPROFEN 800 MG PO TABS: 800.0000 mg | ORAL_TABLET | Freq: Three times a day (TID) | ORAL | 1 refills | Status: DC | PRN

## 2021-04-21 NOTE — Telephone Encounter (Signed)
See my chart message

## 2021-05-20 ENCOUNTER — Other Ambulatory Visit: Payer: Self-pay | Admitting: Internal Medicine

## 2021-05-20 DIAGNOSIS — E559 Vitamin D deficiency, unspecified: Secondary | ICD-10-CM

## 2021-10-27 ENCOUNTER — Other Ambulatory Visit: Payer: Self-pay | Admitting: Obstetrics and Gynecology

## 2021-10-27 DIAGNOSIS — Z3009 Encounter for other general counseling and advice on contraception: Secondary | ICD-10-CM

## 2021-10-27 DIAGNOSIS — Z8742 Personal history of other diseases of the female genital tract: Secondary | ICD-10-CM

## 2021-10-27 NOTE — Telephone Encounter (Signed)
Medication refill request: Micronor  Last AEX:  12/13/20 Next AEX: 12/15/21 Last MMG (if hormonal medication request): 12/16/20 normal  Refill authorized: #28 with 0 RF pended for today to get her to her AEX   Medication refill request: Micronor  Last AEX:  12/13/20 Next AEX: 12/15/21 Last MMG (if hormonal medication request): 12/16/20 normal  Refill authorized: #30 with 0 RF pended for today to get her to her AEX

## 2021-12-08 NOTE — Progress Notes (Unsigned)
46 y.o. G0P0000 Single Black or African American Not Hispanic or Latino female here for annual exam.  She states that she is concerned about her heavy bleeding with her periods  She is typically bleeding q 3-4 weeks x 3-5 days. She can saturate an overnight pad in up to 30 minutes at time. She has a gush, then it slows down. Cramps vary from moderate to severe (worse with heavy flow with cramps).  Period Duration (Days): 3-5 Period Pattern: (!) Irregular Menstrual Flow: Heavy Menstrual Control: Maxi pad Menstrual Control Change Freq (Hours): 6 Dysmenorrhea: (!) Moderate Dysmenorrhea Symptoms: Cramping  H/O fibroid uterus (not deviating her cavity), dysmenorrhea, menorrhagia and severe anemia. Previously she had no bleeding at all on continuous OCP's, she had to stop them secondary to hypertension. She is currently on micronor.     Patient's last menstrual period was 11/28/2021.          Sexually active: No.  The current method of family planning is OCP (estrogen/progesterone).    Exercising: No.  The patient does not participate in regular exercise at present. Smoker:  no  Health Maintenance: Pap:  12/13/20 WNL Hr HPV Neg; 09/21/16 WIL hpv neg;  10/2011 per patient normal  History of abnormal Pap:  no MMG:  12/16/20 Bi-rads 1 neg, scheduled next week  BMD:   none Colonoscopy: never  TDaP: 07/19/17 Gardasil: x1    reports that she has never smoked. She has never used smokeless tobacco. She reports that she does not drink alcohol and does not use drugs. 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   Past Medical History:  Diagnosis Date   Anemia    Depression    Dysmenorrhea    Fibroid    History of blood transfusion 09/2016   [facility] - 2 units transfused   Hypertension    Insomnia    Iron deficiency anemia    Stress     Past Surgical History:  Procedure Laterality Date   DILATATION & CURETTAGE/HYSTEROSCOPY WITH MYOSURE N/A 11/13/2016   Procedure: DILATATION &  CURETTAGE/HYSTEROSCOPY WITH MYOSURE;  Surgeon: Salvadore Dom, MD;  Location: [facility];  Service: Gynecology;  Laterality: N/A;   DILATION AND CURETTAGE OF UTERUS     OTHER SURGICAL HISTORY     cervical polyp removal     Current Outpatient Medications  Medication Sig Dispense Refill   ibuprofen (ADVIL) 800 MG tablet TAKE 1 TABLET BY MOUTH EVERY 8 HOURS AS NEEDED 30 tablet 0   losartan-hydrochlorothiazide (HYZAAR) 50-12.5 MG tablet Take 1 tablet by mouth daily. In am 90 tablet 3   norethindrone (MICRONOR) 0.35 MG tablet TAKE 1 TABLET BY MOUTH EVERY DAY 84 tablet 0   No current facility-administered medications for this visit.    Family History  Problem Relation Age of Onset   Diabetes Mother    Hypertension Mother    Arthritis Mother    Asthma Mother    COPD Father    Breast cancer Maternal Grandmother        mat great gm    Review of Systems  Genitourinary:  Positive for menstrual problem.  All other systems reviewed and are negative.   Exam:   BP 120/64   Pulse 66   Ht '5\' 6"'$  (1.676 m) Comment: patient reported due to hair style  Wt 256 lb (116.1 kg)   LMP 11/28/2021   SpO2 100%   BMI 41.32 kg/m   Weight change: '@WEIGHTCHANGE'$ @ Height:   Height: 5'  6" (167.6 cm) (patient reported due to hair style)  Ht Readings from Last 3 Encounters:  12/15/21 '5\' 6"'$  (1.676 m)  12/13/20 '5\' 6"'$  (1.676 m)  07/29/20 '5\' 6"'$  (1.676 m)    General appearance: alert, cooperative and appears stated age Head: Normocephalic, without obvious abnormality, atraumatic Neck: no adenopathy, supple, symmetrical, trachea midline and thyroid normal to inspection and palpation Lungs: clear to auscultation bilaterally Cardiovascular: regular rate and rhythm Breasts: normal appearance, no masses or tenderness Abdomen: soft, non-tender; non distended,  no masses,  no organomegaly Extremities: extremities normal, atraumatic, no cyanosis or edema Skin: Skin color, texture, turgor normal. No rashes or  lesions Lymph nodes: Cervical, supraclavicular, and axillary nodes normal. No abnormal inguinal nodes palpated Neurologic: Grossly normal   Pelvic: External genitalia:  no lesions              Urethra:  normal appearing urethra with no masses, tenderness or lesions              Bartholins and Skenes: normal                 Vagina: normal appearing vagina with normal color and discharge, no lesions              Cervix: no lesions               Bimanual Exam:  Uterus:   exam limited by BMI, no masses              Adnexa: no mass, fullness, tenderness               Rectovaginal: Confirms               Anus:  normal sphincter tone, no lesions  Gae Dry, CMA chaperoned for the exam.  1. Well woman exam Discussed breast self exam Discussed calcium and vit D intake Mammogram scheduled She will f/u with primary for screening labs   2. Menorrhagia with regular cycle H/O fibroids - CBC - TSH - Ferritin - US PELVIS TRANSVAGINAL NON-OB (TV ONLY); Future - norethindrone (MICRONOR) 0.35 MG tablet; Take 1 tablet (0.35 mg total) by mouth daily.  Dispense: 84 tablet; Refill: 0  3. Dysmenorrhea - ibuprofen (ADVIL) 800 MG tablet; Take 1 tablet (800 mg total) by mouth every 8 (eight) hours as needed.  Dispense: 30 tablet; Refill: 1  4. Colon cancer screening - Ambulatory referral to Gastroenterology

## 2021-12-14 ENCOUNTER — Encounter: Payer: Self-pay | Admitting: Family

## 2021-12-15 ENCOUNTER — Encounter: Payer: Self-pay | Admitting: Family

## 2021-12-15 ENCOUNTER — Encounter: Payer: Self-pay | Admitting: Obstetrics and Gynecology

## 2021-12-15 ENCOUNTER — Ambulatory Visit (INDEPENDENT_AMBULATORY_CARE_PROVIDER_SITE_OTHER): Payer: No Typology Code available for payment source | Admitting: Obstetrics and Gynecology

## 2021-12-15 VITALS — BP 120/64 | HR 66 | Ht 66.0 in | Wt 256.0 lb

## 2021-12-15 DIAGNOSIS — Z01419 Encounter for gynecological examination (general) (routine) without abnormal findings: Secondary | ICD-10-CM | POA: Diagnosis not present

## 2021-12-15 DIAGNOSIS — N946 Dysmenorrhea, unspecified: Secondary | ICD-10-CM

## 2021-12-15 DIAGNOSIS — Z1211 Encounter for screening for malignant neoplasm of colon: Secondary | ICD-10-CM

## 2021-12-15 DIAGNOSIS — N920 Excessive and frequent menstruation with regular cycle: Secondary | ICD-10-CM

## 2021-12-15 MED ORDER — IBUPROFEN 800 MG PO TABS: 800.0000 mg | ORAL_TABLET | Freq: Three times a day (TID) | ORAL | 1 refills | Status: DC | PRN

## 2021-12-15 MED ORDER — NORETHINDRONE 0.35 MG PO TABS: 1.0000 | ORAL_TABLET | Freq: Every day | ORAL | 0 refills | Status: DC

## 2021-12-16 LAB — CBC
HCT: 38.6 % (ref 35.0–45.0)
Hemoglobin: 13.3 g/dL (ref 11.7–15.5)
MCH: 31.1 pg (ref 27.0–33.0)
MCHC: 34.5 g/dL (ref 32.0–36.0)
MCV: 90.2 fL (ref 80.0–100.0)
MPV: 9.1 fL (ref 7.5–12.5)
Platelets: 280 10*3/uL (ref 140–400)
RBC: 4.28 10*6/uL (ref 3.80–5.10)
RDW: 13.2 % (ref 11.0–15.0)
WBC: 4.7 10*3/uL (ref 3.8–10.8)

## 2021-12-16 LAB — TSH: TSH: 1.06 mIU/L

## 2021-12-16 LAB — FERRITIN: Ferritin: 12 ng/mL — ABNORMAL LOW (ref 16–232)

## 2021-12-29 ENCOUNTER — Ambulatory Visit: Payer: No Typology Code available for payment source | Admitting: Internal Medicine

## 2021-12-29 ENCOUNTER — Encounter: Payer: Self-pay | Admitting: Family

## 2021-12-29 ENCOUNTER — Encounter: Payer: Self-pay | Admitting: Internal Medicine

## 2021-12-29 VITALS — BP 130/68 | HR 82 | Temp 98.6°F | Ht 66.0 in | Wt 254.0 lb

## 2021-12-29 DIAGNOSIS — Z1211 Encounter for screening for malignant neoplasm of colon: Secondary | ICD-10-CM

## 2021-12-29 DIAGNOSIS — I10 Essential (primary) hypertension: Secondary | ICD-10-CM | POA: Diagnosis not present

## 2021-12-29 DIAGNOSIS — Z1329 Encounter for screening for other suspected endocrine disorder: Secondary | ICD-10-CM

## 2021-12-29 DIAGNOSIS — R739 Hyperglycemia, unspecified: Secondary | ICD-10-CM

## 2021-12-29 DIAGNOSIS — Z6841 Body Mass Index (BMI) 40.0 and over, adult: Secondary | ICD-10-CM

## 2021-12-29 DIAGNOSIS — R748 Abnormal levels of other serum enzymes: Secondary | ICD-10-CM

## 2021-12-29 DIAGNOSIS — Z1389 Encounter for screening for other disorder: Secondary | ICD-10-CM

## 2021-12-29 DIAGNOSIS — E559 Vitamin D deficiency, unspecified: Secondary | ICD-10-CM

## 2021-12-29 DIAGNOSIS — Z0000 Encounter for general adult medical examination without abnormal findings: Secondary | ICD-10-CM

## 2021-12-29 DIAGNOSIS — Z1231 Encounter for screening mammogram for malignant neoplasm of breast: Secondary | ICD-10-CM

## 2021-12-29 MED ORDER — LOSARTAN POTASSIUM-HCTZ 50-12.5 MG PO TABS: 1.0000 | ORAL_TABLET | Freq: Every day | ORAL | 3 refills | Status: DC

## 2021-12-29 NOTE — Progress Notes (Signed)
Chief Complaint  Patient presents with   Follow-up    Blood pressure and diabetes follow up   Annual Exam   Annual  1. Htn controlled at times misses doses of medication hyzaar 50-12.5 mg qd but controlled today  2. Obesity she asks about moujaro disc only if + dm 2 but disc wegovy and saxenda backordered consider re-discuss in 2024     Review of Systems  Constitutional:  Negative for weight loss.  HENT:  Negative for hearing loss.   Eyes:  Negative for blurred vision.  Respiratory:  Negative for shortness of breath.   Cardiovascular:  Negative for chest pain.  Gastrointestinal:  Negative for abdominal pain and blood in stool.  Genitourinary:  Negative for dysuria.  Musculoskeletal:  Negative for falls and joint pain.  Skin:  Negative for rash.  Neurological:  Negative for headaches.  Psychiatric/Behavioral:  Negative for depression.    Past Medical History:  Diagnosis Date   Anemia    Depression    Dysmenorrhea    Fibroid    History of blood transfusion 09/2016   [facility] - 2 units transfused   Hypertension    Insomnia    Iron deficiency anemia    Stress    Past Surgical History:  Procedure Laterality Date   DILATATION & CURETTAGE/HYSTEROSCOPY WITH MYOSURE N/A 11/13/2016   Procedure: DILATATION & CURETTAGE/HYSTEROSCOPY WITH MYOSURE;  Surgeon: Salvadore Dom, MD;  Location: [facility];  Service: Gynecology;  Laterality: N/A;   DILATION AND CURETTAGE OF UTERUS     OTHER SURGICAL HISTORY     cervical polyp removal    Family History  Problem Relation Age of Onset   Diabetes Mother    Hypertension Mother    Arthritis Mother    Asthma Mother    COPD Father    Breast cancer Maternal Grandmother        mat great gm   Social History   Socioeconomic History   Marital status: Single    Spouse name: Not on file   Number of children: Not on file   Years of education: Not on file   Highest education level: Not on file  Occupational History   Not on file  Tobacco Use    Smoking status: Never   Smokeless tobacco: Never  Vaping Use   Vaping Use: Never used  Substance and Sexual Activity   Alcohol use: No   Drug use: No   Sexual activity: Not Currently    Birth control/protection: Pill  Other Topics Concern   Not on file  Social History Narrative   In school for criminal justice   Works Education officer, 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   norethindrone (MICRONOR) 0.35 MG tablet Take 1 tablet (  0.35 mg total) by mouth daily.   [DISCONTINUED] losartan-hydrochlorothiazide (HYZAAR) 50-12.5 MG tablet Take 1 tablet by mouth daily. In am   No Known Allergies Recent Results (from the past 2160 hour(s))  CBC     Status: None   Collection Time: 12/15/21 11:40 AM  Result Value Ref Range   WBC 4.7 3.8 - 10.8 Thousand/uL   RBC 4.28 3.80 - 5.10 Million/uL   Hemoglobin 13.3 11.7 - 15.5 g/dL   HCT 38.6 35.0 - 45.0 %   MCV 90.2 80.0 - 100.0 fL   MCH 31.1 27.0 - 33.0 pg   MCHC 34.5 32.0 - 36.0 g/dL   RDW 13.2 11.0 - 15.0 %   Platelets 280 140 - 400 Thousand/uL   MPV 9.1 7.5 - 12.5 fL  TSH     Status: None   Collection Time: 12/15/21 11:40 AM  Result Value Ref Range   TSH 1.06 mIU/L    Comment:           Reference Range .           > or = 20 Years  0.40-4.50 .                Pregnancy Ranges           First trimester    0.26-2.66           Second trimester    0.55-2.73           Third trimester    0.43-2.91   Ferritin     Status: Abnormal   Collection Time: 12/15/21 11:40 AM  Result Value Ref Range   Ferritin 12 (L) 16 - 232 ng/mL   Objective  Body mass index is 41 kg/m. Wt Readings from Last 3 Encounters:  12/29/21 254 lb (115.2 kg)  12/15/21 256 lb (116.1 kg)  12/13/20 254 lb (115.2 kg)   Temp Readings from Last 3 Encounters:  12/29/21 98.6 F (37 C) (Oral)  07/29/20 98 F (36.7 C) (Oral)  03/11/20 98.6 F (37 C) (Oral)   BP Readings from Last 3 Encounters:  12/29/21 130/68  12/15/21 120/64  12/13/20 136/70   Pulse Readings from Last 3 Encounters:  12/29/21 82  12/15/21 66  12/13/20 84    Physical Exam Vitals and nursing note reviewed.  Constitutional:      Appearance: Normal appearance. She is well-developed and well-groomed.  HENT:     Head: Normocephalic and atraumatic.  Eyes:     Conjunctiva/sclera: Conjunctivae normal.     Pupils: Pupils are equal, round, and reactive to light.  Cardiovascular:     Rate and Rhythm: Normal rate and regular rhythm.     Heart sounds: Normal heart sounds. No murmur heard. Pulmonary:     Effort: Pulmonary effort is normal.     Breath sounds: Normal breath sounds.  Abdominal:     General: Abdomen is flat. Bowel sounds are normal.     Tenderness: There is no abdominal tenderness.  Musculoskeletal:        General: No tenderness.  Skin:    General: Skin is warm and dry.  Neurological:     General: No focal deficit present.     Mental Status: She is alert and oriented to person, place, and time. Mental status is at baseline.     Cranial Nerves: Cranial nerves 2-12 are intact.     Motor: Motor function is intact.     Coordination: Coordination is intact.     Gait:  Gait is intact.  Psychiatric:        Attention and Perception: Attention and perception normal.        Mood and Affect: Mood and affect normal.        Speech: Speech normal.        Behavior: Behavior normal.  Behavior is cooperative.        Thought Content: Thought content normal.        Cognition and Memory: Cognition and memory normal.        Judgment: Judgment normal.     Assessment  Plan  Annual physical exam See below    Primary hypertension controlled with obesity bmi >41- Plan: Comprehensive metabolic panel, Lipid panel, CBC with Differential/Platelet On hyzaar hct 50-12.5 mg qd  Disc wegovy or saxenda consider in 2024   HM Declines flu shot/covid Tdap utd Hep B vaccine immune  HPV 1/3 messaged ob/gyn Dr. Talbert Nan pt needs to complete series  Declines HIV, hep C check  covid vaccine declines no h/o covid as of 07/29/20    12/13/20 pap neg neg HPV ob/gyn last seen 12/15/21 Dr. Talbert Nan  And appt upcoming 12/2021 for [place] for fibroids   Mammogram neg 12/19/18 ordered  for 2021 will call to schedule change to [facility] 01/2022    Colonoscopy consider age 36 referred [facility]    Vit D def recheck vitamin D on D3 5000 IU qd    rec healthy diet and exercise    Provider: Dr. Olivia Mackie McLean-Scocuzza-[facility]

## 2021-12-29 NOTE — Patient Instructions (Addendum)
[facility] GI    Phone Fax E-mail Address  [phone] Not available [place] [id number]     Specialties     Gastroenterology       Liraglutide Injection (Weight Management) What is this medication? LIRAGLUTIDE (LIR a GLOO tide) promotes weight loss. It may also be used to maintain weight loss. It works by decreasing appetite. Changes to diet and exercise are often combined with this medication. This medicine may be used for other purposes; ask your health care provider or pharmacist if you have questions. COMMON BRAND NAME(S): Saxenda What should I tell my care team before I take this medication? They need to know if you have any of these conditions: Endocrine tumors (MEN 2) or if someone in your family had these tumors Gallbladder disease High cholesterol History of alcohol abuse problem History of pancreatitis Kidney disease or if you are on dialysis Liver disease Previous swelling of the tongue, face, or lips with difficulty breathing, difficulty swallowing, hoarseness, or tightening of the throat Stomach problems Suicidal thoughts, plans, or attempt; a previous suicide attempt by you or a family member Thyroid cancer or if someone in your family had thyroid cancer An unusual or allergic reaction to liraglutide, other medications, foods, dyes, or preservatives Pregnant or trying to get pregnant Breast-feeding How should I use this medication? This medication is for injection under the skin of your upper leg, stomach area, or upper arm. You will be taught how to prepare and give this medication. Use exactly as directed. Take your medication at regular intervals. Do not take it more often than directed. This medication comes with INSTRUCTIONS FOR USE. Ask your pharmacist for directions on how to use this medication. Read the information carefully. Talk to your pharmacist or care team if you have questions. It is important that you put  your used needles and syringes in a special sharps container. Do not put them in a trash can. If you do not have a sharps container, call your pharmacist or care team to get one. A special MedGuide will be given to you by the pharmacist with each prescription and refill. Be sure to read this information carefully each time. Talk to your care team about the use of this medication in children. While it may be prescribed for children as young as 44 years of age for selected conditions, precautions do apply. Overdosage: If you think you have taken too much of this medicine contact a poison control center or emergency room at once. NOTE: This medicine is only for you. Do not share this medicine with others. What if I miss a dose? If you miss a dose, take it as soon as you can. If it is almost time for your next dose, take only that dose. Do not take double or extra doses. If you miss your dose for 3 days or more, call your care team to talk about how to restart this medicine. What may interact with this medication? Insulin and other medications for diabetes This list may not describe all possible interactions. Give your health care provider a list of all the medicines, herbs, non-prescription drugs, or dietary supplements you use. Also tell them if you smoke, drink alcohol, or use illegal drugs. Some items may interact with your medicine. What should I watch for while using this medication? Visit your care team for regular checks on your progress. Drink plenty of fluids while taking this medication. Check with your care team  if you get an attack of severe diarrhea, nausea, and vomiting. The loss of too much body fluid can make it dangerous for you to take this medication. This medication may affect blood sugar levels. Ask your care team if changes in diet or medications are needed if you have diabetes. Patients and their families should watch out for worsening depression or thoughts of suicide. Also watch  out for sudden changes in feelings such as feeling anxious, agitated, panicky, irritable, hostile, aggressive, impulsive, severely restless, overly excited and hyperactive, or not being able to sleep. If this happens, especially at the beginning of treatment or after a change in dose, call your care team. Women should inform their care team if they wish to become pregnant or think they might be pregnant. Losing weight while pregnant is not advised and may cause harm to the unborn child. Talk to your care team for more information. What side effects may I notice from receiving this medication? Side effects that you should report to your care team as soon as possible: Allergic reactions or angioedema--skin rash, itching, hives, swelling of the face, eyes, lips, tongue, arms, or legs, trouble swallowing or breathing Fast or irregular heartbeat Gallbladder problems--severe stomach pain, nausea, vomiting, fever Kidney injury--decrease in the amount of urine, swelling of the ankles, hands, or feet Pancreatitis--severe stomach pain that spreads to your back or gets worse after eating or when touched, fever, nausea, vomiting Thoughts of suicide or self-harm, worsening mood, feelings of depression Thyroid cancer--new mass or lump in the neck, pain or trouble swallowing, trouble breathing, hoarseness Side effects that usually do not require medical attention (report to your care team if they continue or are bothersome): Constipation Dizziness Fatigue Headache Loss of Appetite Nausea Upset stomach This list may not describe all possible side effects. Call your doctor for medical advice about side effects. You may report side effects to FDA at 1-800-FDA-1088. Where should I keep my medication? Keep out of the reach of children and pets. Store unopened pen in a refrigerator between 2 and 8 degrees C (36 and 46 degrees F). Do not freeze or use if the medication has been frozen. Protect from light and  excessive heat. After you first use the pen, it can be stored at room temperature between 15 and 30 degrees C (59 and 86 degrees F) or in a refrigerator. Throw away your used pen after 30 days or after the expiration date, whichever comes first. Do not store your pen with the needle attached. If the needle is left on, medication may leak from the pen. NOTE: This sheet is a summary. It may not cover all possible information. If you have questions about this medicine, talk to your doctor, pharmacist, or health care provider.  2023 Elsevier/Gold Standard (2020-04-16 00:00:00)  Semaglutide Injection (Weight Management) What is this medication? SEMAGLUTIDE (SEM a GLOO tide) promotes weight loss. It may also be used to maintain weight loss. It works by decreasing appetite. Changes to diet and exercise are often combined with this medication. This medicine may be used for other purposes; ask your health care provider or pharmacist if you have questions. COMMON BRAND NAME(S): [id number] What should I tell my care team before I take this medication? They need to know if you have any of these conditions: Endocrine tumors (MEN 2) or if someone in your family had these tumors Eye disease, vision problems Gallbladder disease History of depression or mental health disease History of pancreatitis Kidney disease Stomach or intestine problems  Suicidal thoughts, plans, or attempt; a previous suicide attempt by you or a family member Thyroid cancer or if someone in your family had thyroid cancer An unusual or allergic reaction to semaglutide, other medications, foods, dyes, or preservatives Pregnant or trying to get pregnant Breast-feeding How should I use this medication? This medication is injected under the skin. You will be taught how to prepare and give it. Take it as directed on the prescription label. It is given once every week (every 7 days). Keep taking it unless your care team tells you to stop. It is  important that you put your used needles and pens in a special sharps container. Do not put them in a trash can. If you do not have a sharps container, call your pharmacist or care team to get one. A special MedGuide will be given to you by the pharmacist with each prescription and refill. Be sure to read this information carefully each time. This medication comes with INSTRUCTIONS FOR USE. Ask your pharmacist for directions on how to use this medication. Read the information carefully. Talk to your pharmacist or care team if you have questions. Talk to your care team about the use of this medication in children. While it may be prescribed for children as young as 12 years for selected conditions, precautions do apply. Overdosage: If you think you have taken too much of this medicine contact a poison control center or emergency room at once. NOTE: This medicine is only for you. Do not share this medicine with others. What if I miss a dose? If you miss a dose and the next scheduled dose is more than 2 days away, take the missed dose as soon as possible. If you miss a dose and the next scheduled dose is less than 2 days away, do not take the missed dose. Take the next dose at your regular time. Do not take double or extra doses. If you miss your dose for 2 weeks or more, take the next dose at your regular time or call your care team to talk about how to restart this medication. What may interact with this medication? Insulin and other medications for diabetes This list may not describe all possible interactions. Give your health care provider a list of all the medicines, herbs, non-prescription drugs, or dietary supplements you use. Also tell them if you smoke, drink alcohol, or use illegal drugs. Some items may interact with your medicine. What should I watch for while using this medication? Visit your care team for regular checks on your progress. It may be some time before you see the benefit from this  medication. Drink plenty of fluids while taking this medication. Check with your care team if you have severe diarrhea, nausea, and vomiting, or if you sweat a lot. The loss of too much body fluid may make it dangerous for you to take this medication. This medication may affect blood sugar levels. Ask your care team if changes in diet or medications are needed if you have diabetes. If you or your family notice any changes in your behavior, such as new or worsening depression, thoughts of harming yourself, anxiety, other unusual or disturbing thoughts, or memory loss, call your care team right away. Women should inform their care team if they wish to become pregnant or think they might be pregnant. Losing weight while pregnant is not advised and may cause harm to the unborn child. Talk to your care team for more information. What side effects  may I notice from receiving this medication? Side effects that you should report to your care team as soon as possible: Allergic reactions--skin rash, itching, hives, swelling of the face, lips, tongue, or throat Change in vision Dehydration--increased thirst, dry mouth, feeling faint or lightheaded, headache, dark yellow or brown urine Gallbladder problems--severe stomach pain, nausea, vomiting, fever Heart palpitations--rapid, pounding, or irregular heartbeat Kidney injury--decrease in the amount of urine, swelling of the ankles, hands, or feet Pancreatitis--severe stomach pain that spreads to your back or gets worse after eating or when touched, fever, nausea, vomiting Thoughts of suicide or self-harm, worsening mood, feelings of depression Thyroid cancer--new mass or lump in the neck, pain or trouble swallowing, trouble breathing, hoarseness Side effects that usually do not require medical attention (report to your care team if they continue or are bothersome): Diarrhea Loss of appetite Nausea Stomach pain Vomiting This list may not describe all  possible side effects. Call your doctor for medical advice about side effects. You may report side effects to FDA at 1-800-FDA-1088. Where should I keep my medication? Keep out of the reach of children and pets. Refrigeration (preferred): Store in the refrigerator. Do not freeze. Keep this medication in the original container until you are ready to take it. Get rid of any unused medication after the expiration date. Room temperature: If needed, prior to cap removal, the pen can be stored at room temperature for up to 28 days. Protect from light. If it is stored at room temperature, get rid of any unused medication after 28 days or after it expires, whichever is first. It is important to get rid of the medication as soon as you no longer need it or it is expired. You can do this in two ways: Take the medication to a medication take-back program. Check with your pharmacy or law enforcement to find a location. If you cannot return the medication, follow the directions in the [place]. NOTE: This sheet is a summary. It may not cover all possible information. If you have questions about this medicine, talk to your doctor, pharmacist, or health care provider.  2023 Elsevier/Gold Standard (2020-05-27 00:00:00)  Colonoscopy, Adult A colonoscopy is a procedure to look at the entire large intestine. This procedure is done using a long, thin, flexible tube that has a camera on the end. You may have a colonoscopy: As a part of normal colorectal screening. If you have certain symptoms, such as: A low number of red blood cells in your blood (anemia). Diarrhea that does not go away. Pain in your abdomen. Blood in your stool. A colonoscopy can help screen for and diagnose medical problems, including: An abnormal growth of cells or tissue (tumor). Abnormal growths within the lining of your intestine (polyps). Inflammation. Areas of bleeding. Tell your health care provider about: Any allergies you  have. All medicines you are taking, including vitamins, herbs, eye drops, creams, and over-the-counter medicines. Any problems you or family members have had with anesthetic medicines. Any bleeding problems you have. Any surgeries you have had. Any medical conditions you have. Any problems you have had with having bowel movements. Whether you are pregnant or may be pregnant. What are the risks? Generally, this is a safe procedure. However, problems may occur, including: Bleeding. Damage to your intestine. Allergic reactions to medicines given during the procedure. Infection. This is rare. What happens before the procedure? Eating and drinking restrictions Follow instructions from your health care provider about eating or drinking restrictions, which may include: A  few days before the procedure: Follow a low-fiber diet. Avoid nuts, seeds, dried fruit, raw fruits, and vegetables. 1-3 days before the procedure: Eat only gelatin dessert or ice pops. Drink only clear liquids, such as water, clear juice, clear broth or bouillon, black coffee or tea, or clear soft drinks or sports drinks. Avoid liquids that contain red or purple dye. The day of the procedure: Do not eat solid foods. You may continue to drink clear liquids until up to 2 hours before the procedure. Do not eat or drink anything starting 2 hours before the procedure, or within the time period that your health care provider recommends. Bowel prep If you were prescribed a bowel prep to take by mouth (orally) to clean out your colon: Take it as told by your health care provider. Starting the day before your procedure, you will need to drink a large amount of liquid medicine. The liquid will cause you to have many bowel movements of loose stool until your stool becomes almost clear or light green. If your skin or the opening between the buttocks (anus) gets irritated from diarrhea, you may relieve the irritation using: Wipes with  medicine in them, such as adult wet wipes with aloe and vitamin E. A product to soothe skin, such as petroleum jelly. If you vomit while drinking the bowel prep: Take a break for up to 60 minutes. Begin the bowel prep again. Call your health care provider if you keep vomiting or you cannot take the bowel prep without vomiting. To clean out your colon, you may also be given: Laxative medicines. These help you have a bowel movement. Instructions for enema use. An enema is liquid medicine injected into your rectum. Medicines Ask your health care provider about: Changing or stopping your regular medicines or supplements. This is especially important if you are taking iron supplements, diabetes medicines, or blood thinners. Taking medicines such as aspirin and ibuprofen. These medicines can thin your blood. Do not take these medicines unless your health care provider tells you to take them. Taking over-the-counter medicines, vitamins, herbs, and supplements. General instructions Ask your health care provider what steps will be taken to help prevent infection. These may include washing skin with a germ-killing soap. If you will be going home right after the procedure, plan to have a responsible adult: Take you home from the hospital or clinic. You will not be allowed to drive. Care for you for the time you are told. What happens during the procedure?  An IV will be inserted into one of your veins. You will be given a medicine to make you fall asleep (general anesthetic). You will lie on your side with your knees bent. A lubricant will be put on the tube. Then the tube will be: Inserted into your anus. Gently eased through all parts of your large intestine. Air will be sent into your colon to keep it open. This may cause some pressure or cramping. Images will be taken with the camera and will appear on a screen. A small tissue sample may be removed to be looked at under a microscope (biopsy).  The tissue may be sent to a lab for testing if any signs of problems are found. If small polyps are found, they may be removed and checked for cancer cells. When the procedure is finished, the tube will be removed. The procedure may vary among health care providers and hospitals. What happens after the procedure? Your blood pressure, heart rate, breathing rate, and blood  oxygen level will be monitored until you leave the hospital or clinic. You may have a small amount of blood in your stool. You may pass gas and have mild cramping or bloating in your abdomen. This is caused by the air that was used to open your colon during the exam. If you were given a sedative during the procedure, it can affect you for several hours. Do not drive or operate machinery until your health care provider says that it is safe. It is up to you to get the results of your procedure. Ask your health care provider, or the department that is doing the procedure, when your results will be ready. Summary A colonoscopy is a procedure to look at the entire large intestine. Follow instructions from your health care provider about eating and drinking before the procedure. If you were prescribed an oral bowel prep to clean out your colon, take it as told by your health care provider. During the colonoscopy, a flexible tube with a camera on its end is inserted into the anus and then passed into all parts of the large intestine. This information is not intended to replace advice given to you by your health care provider. Make sure you discuss any questions you have with your health care provider. Document Revised: 03/07/2021 Document Reviewed: 11/03/2020 Elsevier Patient Education  [place].

## 2022-01-05 ENCOUNTER — Telehealth: Payer: Self-pay | Admitting: Internal Medicine

## 2022-01-05 ENCOUNTER — Other Ambulatory Visit (INDEPENDENT_AMBULATORY_CARE_PROVIDER_SITE_OTHER): Payer: No Typology Code available for payment source

## 2022-01-05 ENCOUNTER — Encounter: Payer: Self-pay | Admitting: Obstetrics and Gynecology

## 2022-01-05 ENCOUNTER — Other Ambulatory Visit: Payer: Self-pay | Admitting: Internal Medicine

## 2022-01-05 ENCOUNTER — Ambulatory Visit (INDEPENDENT_AMBULATORY_CARE_PROVIDER_SITE_OTHER): Payer: No Typology Code available for payment source

## 2022-01-05 ENCOUNTER — Telehealth: Payer: Self-pay

## 2022-01-05 ENCOUNTER — Ambulatory Visit (INDEPENDENT_AMBULATORY_CARE_PROVIDER_SITE_OTHER): Payer: No Typology Code available for payment source | Admitting: Obstetrics and Gynecology

## 2022-01-05 ENCOUNTER — Encounter: Payer: Self-pay | Admitting: Internal Medicine

## 2022-01-05 VITALS — BP 140/70 | HR 82 | Wt 253.0 lb

## 2022-01-05 DIAGNOSIS — N920 Excessive and frequent menstruation with regular cycle: Secondary | ICD-10-CM | POA: Diagnosis not present

## 2022-01-05 DIAGNOSIS — R739 Hyperglycemia, unspecified: Secondary | ICD-10-CM

## 2022-01-05 DIAGNOSIS — I10 Essential (primary) hypertension: Secondary | ICD-10-CM | POA: Diagnosis not present

## 2022-01-05 DIAGNOSIS — D259 Leiomyoma of uterus, unspecified: Secondary | ICD-10-CM

## 2022-01-05 DIAGNOSIS — E559 Vitamin D deficiency, unspecified: Secondary | ICD-10-CM | POA: Diagnosis not present

## 2022-01-05 DIAGNOSIS — Z1389 Encounter for screening for other disorder: Secondary | ICD-10-CM

## 2022-01-05 DIAGNOSIS — R748 Abnormal levels of other serum enzymes: Secondary | ICD-10-CM

## 2022-01-05 DIAGNOSIS — Z1329 Encounter for screening for other suspected endocrine disorder: Secondary | ICD-10-CM | POA: Diagnosis not present

## 2022-01-05 DIAGNOSIS — R7303 Prediabetes: Secondary | ICD-10-CM | POA: Insufficient documentation

## 2022-01-05 LAB — LIPID PANEL
Cholesterol: 194 mg/dL (ref 0–200)
HDL: 70 mg/dL (ref >39.00)
LDL Cholesterol: 107 mg/dL — ABNORMAL HIGH (ref 0–99)
NonHDL: 124.14
Total CHOL/HDL Ratio: 3
Triglycerides: 88 mg/dL (ref 0.0–149.0)
VLDL: 17.6 mg/dL (ref 0.0–40.0)

## 2022-01-05 LAB — COMPREHENSIVE METABOLIC PANEL
ALT: 17 U/L (ref 0–35)
AST: 19 U/L (ref 0–37)
Albumin: 4 g/dL (ref 3.5–5.2)
Alkaline Phosphatase: 203 U/L — ABNORMAL HIGH (ref 39–117)
BUN: 13 mg/dL (ref 6–23)
CO2: 23 mEq/L (ref 19–32)
Calcium: 10.7 mg/dL — ABNORMAL HIGH (ref 8.4–10.5)
Chloride: 105 mEq/L (ref 96–112)
Creatinine, Ser: 0.79 mg/dL (ref 0.40–1.20)
GFR: 90.52 mL/min (ref >60.00)
Glucose, Bld: 89 mg/dL (ref 70–99)
Potassium: 4.3 mEq/L (ref 3.5–5.1)
Sodium: 135 mEq/L (ref 135–145)
Total Bilirubin: 0.4 mg/dL (ref 0.2–1.2)
Total Protein: 7.1 g/dL (ref 6.0–8.3)

## 2022-01-05 LAB — CBC WITH DIFFERENTIAL/PLATELET
Basophils Absolute: 0 10*3/uL (ref 0.0–0.1)
Basophils Relative: 0.3 % (ref 0.0–3.0)
Eosinophils Absolute: 0.1 10*3/uL (ref 0.0–0.7)
Eosinophils Relative: 1.7 % (ref 0.0–5.0)
HCT: 40.4 % (ref 36.0–46.0)
Hemoglobin: 13.4 g/dL (ref 12.0–15.0)
Lymphocytes Relative: 42 % (ref 12.0–46.0)
Lymphs Abs: 1.9 10*3/uL (ref 0.7–4.0)
MCHC: 33.2 g/dL (ref 30.0–36.0)
MCV: 88.4 fl (ref 78.0–100.0)
Monocytes Absolute: 0.6 10*3/uL (ref 0.1–1.0)
Monocytes Relative: 12.8 % — ABNORMAL HIGH (ref 3.0–12.0)
Neutro Abs: 2 10*3/uL (ref 1.4–7.7)
Neutrophils Relative %: 43.2 % (ref 43.0–77.0)
Platelets: 309 10*3/uL (ref 150.0–400.0)
RBC: 4.58 Mil/uL (ref 3.87–5.11)
RDW: 13.9 % (ref 11.5–15.5)
WBC: 4.5 10*3/uL (ref 4.0–10.5)

## 2022-01-05 LAB — VITAMIN D 25 HYDROXY (VIT D DEFICIENCY, FRACTURES): VITD: 29.38 ng/mL — ABNORMAL LOW (ref 30.00–100.00)

## 2022-01-05 LAB — TSH: TSH: 1.52 u[IU]/mL (ref 0.35–5.50)

## 2022-01-05 LAB — HEMOGLOBIN A1C: Hgb A1c MFr Bld: 5.7 % (ref 4.6–6.5)

## 2022-01-05 MED ORDER — CHOLECALCIFEROL 1.25 MG (50000 UT) PO CAPS: 50000.0000 [IU] | ORAL_CAPSULE | ORAL | 1 refills | Status: DC

## 2022-01-05 NOTE — Telephone Encounter (Signed)
LMOM for pt to CB in regards to labs 

## 2022-01-05 NOTE — Addendum Note (Signed)
Addended by: Orland Mustard on: 01/05/2022 12:31 PM   Modules accepted: Orders

## 2022-01-05 NOTE — Telephone Encounter (Signed)
Lft pt vm to call ofc . thanks 

## 2022-01-05 NOTE — Progress Notes (Signed)
GYNECOLOGY  VISIT   HPI: 46 y.o.   Single Black or African American Not Hispanic or Latino  female   G0P0000 with Patient's last menstrual period was 11/28/2021.   here for evaluation of menorrhagia on micronor. She is typically bleeding q 3-4 weeks x 3-5 days. She can saturate an overnight pad in up to 30 minutes at time. She has a gush, then it slows down. Cramps vary from moderate to severe (worse with heavy flow with cramps).  She had bleeding on 12/17/21 for one day. She states that the bleeding was heavy.   She had no bleeding on continuous OCP's, but had to stop them secondary to HTN.  Prior imaging she had a fibroid uterus, no myomas were deviating the endometrium. She also has a h/o severe anemia.   In 8/18 she underwent a hysteroscopy, D&C with removal of endometrial polyps. Pathology was benign.   12/15/21 labs: Hgb 13.3, Ferritin 12, TSH 1.06.   01/05/22 labs: Hgb 13.4   GYNECOLOGIC HISTORY: Patient's last menstrual period was 11/28/2021. Contraception:micronor, abstinence  Menopausal hormone therapy: NA        OB History     Gravida  0   Para  0   Term  0   Preterm  0   AB  0   Living  0      SAB  0   IAB  0   Ectopic  0   Multiple  0   Live Births  0              Patient Active Problem List   Diagnosis Date Noted   Prediabetes 01/05/2022   BMI 40.0-44.9, adult ([place]) 12/29/2021   Class 3 severe obesity due to excess calories with serious comorbidity and body mass index (BMI) of 40.0 to 44.9 in adult [facility]) 12/29/2021   Seborrheic dermatitis 03/11/2020   Laryngitis 03/11/2020   Hypertension 01/21/2020   Annual physical exam 11/07/2018   Allergic rhinitis 11/07/2018   Stress 03/07/2018   Depression 03/07/2018   Vitamin D deficiency 06/24/2017   Morbid obesity with BMI of 40.0-44.9, adult ([place]) 06/07/2017   IDA (iron deficiency anemia) 10/04/2016    Past Medical History:  Diagnosis Date   Anemia    Depression    Dysmenorrhea     Fibroid    History of blood transfusion 09/2016   [facility] - 2 units transfused   Hypertension    Insomnia    Iron deficiency anemia    Stress     Past Surgical History:  Procedure Laterality Date   DILATATION & CURETTAGE/HYSTEROSCOPY WITH MYOSURE N/A 11/13/2016   Procedure: DILATATION & CURETTAGE/HYSTEROSCOPY WITH MYOSURE;  Surgeon: Salvadore Dom, MD;  Location: [facility];  Service: Gynecology;  Laterality: N/A;   DILATION AND CURETTAGE OF UTERUS     OTHER SURGICAL HISTORY     cervical polyp removal     Current Outpatient Medications  Medication Sig Dispense Refill   Cholecalciferol 1.25 MG (50000 UT) capsule Take 1 capsule (50,000 Units total) by mouth once a week. d3 13 capsule 1   ibuprofen (ADVIL) 800 MG tablet Take 1 tablet (800 mg total) by mouth every 8 (eight) hours as needed. 30 tablet 1   losartan-hydrochlorothiazide (HYZAAR) 50-12.5 MG tablet Take 1 tablet by mouth daily. In am 90 tablet 3   norethindrone (MICRONOR) 0.35 MG tablet Take 1 tablet (0.35 mg total) by mouth daily. 84 tablet 0   No current facility-administered medications for this visit.  ALLERGIES: Patient has no known allergies.  Family History  Problem Relation Age of Onset   Diabetes Mother    Hypertension Mother    Arthritis Mother    Asthma Mother    COPD Father    Breast cancer Maternal Grandmother        mat great gm    Social History   Socioeconomic History   Marital status: Single    Spouse name: Not on file   Number of children: Not on file   Years of education: Not on file   Highest education level: Not on file  Occupational History   Not on file  Tobacco Use   Smoking status: Never   Smokeless tobacco: Never  Vaping Use   Vaping Use: Never used  Substance and Sexual Activity   Alcohol use: No   Drug use: No   Sexual activity: Not Currently    Birth control/protection: Pill  Other Topics Concern   Not on file  Social History Narrative   In school for criminal justice    Works Education officer, 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   PHYSICAL EXAMINATION:    LMP 11/28/2021     General appearance: alert, cooperative and appears stated age  Pelvic ultrasound  Indications: Menorrhagia  Findings:  Uterus 13.27 x 11.11 x 7.81 cm, anteverted Fibroids: 1) 6.28 x 5.95 cm, intramural/subserosal, deviates the cavity 2) 2.27 x 1.44 cm, intramural 3) 1.50 x 1.54 cm, intramural 4) 1.73 x 1.51 cm, intramural 5) 1.87 x 1.23 cm, intramural 6) 1.93 x 1.27 cm, intramural  Endometrium 5.59 cm, difficult to see well secondary to fibroids  Left ovary 4.06 x 2.80 x 2.06 cm  Right ovary 3.61 x 1.29 x 2.31 cm  No free fluid  Impression:  Enlarged fibroid uterus, increased in size since prior u/s in 7/18 Endometrial cavity distorted by large central fibroid Normal adnexa bilaterally  1. Menorrhagia with regular cycle Enlarging fibroid uterus, limited evaluation of the endometrium on u/s secondary to fibroids. Menorrhagia on micronor. Not anemic but low iron stores. -She will start iron -Discussed options of trying daily aygestin, lysteda with her cycles, possible mirena IUD, u/s ablation, uterine artery embolization and hysterectomy. She is interested in medical management -She will return for a sonohysterogram, endometrial biopsy and likely mirena IUD insertion. Discussed risks of the IUD and information was given  2. Uterine  leiomyoma, unspecified location  In addition to reviewing u/s results. Counseling was done as to options of treatment, f/u testing has been ordered and treatment has been ordered.

## 2022-01-06 ENCOUNTER — Other Ambulatory Visit: Payer: Self-pay | Admitting: Internal Medicine

## 2022-01-06 DIAGNOSIS — N3000 Acute cystitis without hematuria: Secondary | ICD-10-CM

## 2022-01-06 LAB — URINALYSIS, ROUTINE W REFLEX MICROSCOPIC
Bilirubin Urine: NEGATIVE
Glucose, UA: NEGATIVE
Hyaline Cast: NONE SEEN /LPF
Ketones, ur: NEGATIVE
Leukocytes,Ua: NEGATIVE
Nitrite: NEGATIVE
Protein, ur: NEGATIVE
Specific Gravity, Urine: 1.023 (ref 1.001–1.035)
pH: 5.5 (ref 5.0–8.0)

## 2022-01-06 LAB — MICROSCOPIC MESSAGE

## 2022-01-10 ENCOUNTER — Other Ambulatory Visit: Payer: No Typology Code available for payment source

## 2022-01-10 DIAGNOSIS — N3000 Acute cystitis without hematuria: Secondary | ICD-10-CM

## 2022-01-12 LAB — URINE CULTURE
MICRO NUMBER:: 14061867
SPECIMEN QUALITY:: ADEQUATE

## 2022-01-19 ENCOUNTER — Ambulatory Visit
Admission: RE | Admit: 2022-01-19 | Discharge: 2022-01-19 | Disposition: A | Discharge status: Home / Self Care | Payer: PRIVATE HEALTH INSURANCE | Source: Ambulatory Visit | Attending: Internal Medicine | Admitting: Internal Medicine

## 2022-01-19 DIAGNOSIS — R748 Abnormal levels of other serum enzymes: Secondary | ICD-10-CM | POA: Insufficient documentation

## 2022-01-20 ENCOUNTER — Telehealth: Payer: Self-pay | Admitting: Internal Medicine

## 2022-01-20 NOTE — Telephone Encounter (Signed)
[facility] called with the follow ing report.    IMPRESSION: 1. There is an ill-defined hypoechoic mass within the right hepatic lobe measuring up to 2.6 cm. Recommend further evaluation with pre and post contrast-enhanced abdominal MRI. 2. Increased hepatic parenchymal echogenicity suggestive of steatosis. 3. No cholelithiasis or sonographic evidence for acute cholecystitis. 4. These results will be called to the ordering clinician or representative by the Radiologist Assistant, and communication documented in the PACS or [employer].

## 2022-01-20 NOTE — Telephone Encounter (Signed)
It appears margaret sent a message to the patient on mychart regarding this that the patient has viewed. Can you call her to make sure she gets scheduled with someone to discuss the week of 10/30? Thanks.

## 2022-01-23 NOTE — Telephone Encounter (Signed)
Noted. That is fine.  

## 2022-01-24 ENCOUNTER — Telehealth: Payer: Self-pay

## 2022-01-24 ENCOUNTER — Ambulatory Visit (INDEPENDENT_AMBULATORY_CARE_PROVIDER_SITE_OTHER): Payer: No Typology Code available for payment source | Admitting: Family Medicine

## 2022-01-24 ENCOUNTER — Encounter: Payer: Self-pay | Admitting: Family Medicine

## 2022-01-24 ENCOUNTER — Telehealth: Payer: Self-pay | Admitting: Internal Medicine

## 2022-01-24 VITALS — Ht 66.0 in | Wt 250.0 lb

## 2022-01-24 DIAGNOSIS — K769 Liver disease, unspecified: Secondary | ICD-10-CM

## 2022-01-24 NOTE — Assessment & Plan Note (Signed)
Discussed the need for an MRI to evaluate further.  This was ordered.  Advised we will contact her with the results.

## 2022-01-24 NOTE — Telephone Encounter (Signed)
LMOM for pt to CB in regards to NP Arnetts request to have her book a f/u appt in regards to her  [place]

## 2022-01-24 NOTE — Progress Notes (Signed)
Virtual Visit via telephone Note  This visit type was conducted due to national recommendations for restrictions regarding the COVID-19 pandemic (e.g. social distancing).  This format is felt to be most appropriate for this patient at this time.  All issues noted in this document were discussed and addressed.  No physical exam was performed (except for noted visual exam findings with Video Visits).   I connected with Stacy Miles today at  9:30 AM EDT by a video enabled telemedicine application or telephone and verified that I am speaking with the correct person using two identifiers. Location patient: work Location provider: work Persons participating in the virtual visit: patient, provider  I discussed the limitations, risks, security and privacy concerns of performing an evaluation and management service by telephone and the availability of in person appointments. I also discussed with the patient that there may be a patient responsible charge related to this service. The patient expressed understanding and agreed to proceed.  Interactive audio and video telecommunications were attempted between this provider and patient, however failed, due to patient having technical difficulties OR patient did not have access to video capability.  We continued and completed visit with audio only.   Reason for visit: f/u on [place] results  HPI: Liver lesion: Noted on recent ultrasound that was ordered for elevated alkaline phosphatase.  She has no right upper quadrant pain, cancer history, alcohol use, or hepatitis history.  She smoked a very minimal amount when she was in her early 75s.  He has had no jaundice.  She notes the only cancer history in her family is breast cancer in her maternal great grandmother.  She does not have a pacemaker.  She has no metal in her body.  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  She notes no claustrophobia.   ROS: See pertinent positives and negatives per  HPI.  Past Medical History:  Diagnosis Date   Anemia    Depression    Dysmenorrhea    Fibroid    History of blood transfusion 09/2016   [facility] - 2 units transfused   Hypertension    Insomnia    Iron deficiency anemia    Stress     Past Surgical History:  Procedure Laterality Date   DILATATION & CURETTAGE/HYSTEROSCOPY WITH MYOSURE N/A 11/13/2016   Procedure: DILATATION & CURETTAGE/HYSTEROSCOPY WITH MYOSURE;  Surgeon: Salvadore Dom, MD;  Location: [facility];  Service: Gynecology;  Laterality: N/A;   DILATION AND CURETTAGE OF UTERUS     OTHER SURGICAL HISTORY     cervical polyp removal     Family History  Problem Relation Age of Onset   Diabetes Mother    Hypertension Mother    Arthritis Mother    Asthma Mother    COPD Father    Breast cancer Maternal Grandmother        mat great gm    SOCIAL HX: Non-smoker   Current Outpatient Medications:    ibuprofen (ADVIL) 800 MG tablet, Take 1 tablet (800 mg total) by mouth every 8 (eight) hours as needed., Disp: 30 tablet, Rfl: 1   losartan-hydrochlorothiazide (HYZAAR) 50-12.5 MG tablet, Take 1 tablet by mouth daily. In am, Disp: 90 tablet, Rfl: 3   norethindrone (MICRONOR) 0.35 MG tablet, Take 1 tablet (0.35 mg total) by mouth daily., Disp: 84 tablet, Rfl: 0   Cholecalciferol 1.25 MG (50000 UT) capsule, Take 1 capsule (50,000 Units total) by mouth once a week. d3 (Patient not taking: Reported  on 01/24/2022), Disp: 13 capsule, Rfl: 1  EXAM: This was a telephone visit thus no exam was completed  ASSESSMENT AND PLAN:  Discussed the following assessment and plan:  Problem List Items Addressed This Visit     Liver lesion - Primary    Discussed the need for an MRI to evaluate further.  This was ordered.  Advised we will contact her with the results.      Other Visit Diagnoses     Liver disease       Relevant Orders   MR Abdomen W Wo Contrast       Return for as scheduled.   I discussed the assessment and treatment  plan with the patient. The patient was provided an opportunity to ask questions and all were answered. The patient agreed with the plan and demonstrated an understanding of the instructions.   The patient was advised to call back or seek an in-person evaluation if the symptoms worsen or if the condition fails to improve as anticipated.  I provided 6 minutes of non-face-to-face time during this encounter.   Tommi Rumps, MD

## 2022-01-24 NOTE — Telephone Encounter (Signed)
Lft pt vm to call ofc to sch MRI. thanks 

## 2022-02-09 ENCOUNTER — Ambulatory Visit
Admission: RE | Admit: 2022-02-09 | Discharge: 2022-02-09 | Disposition: A | Discharge status: Home / Self Care | Payer: PRIVATE HEALTH INSURANCE | Source: Ambulatory Visit | Attending: Family Medicine | Admitting: Family Medicine

## 2022-02-09 DIAGNOSIS — K769 Liver disease, unspecified: Secondary | ICD-10-CM | POA: Insufficient documentation

## 2022-02-09 MED ORDER — GADOBUTROL 1 MMOL/ML IV SOLN
10.0000 mL | Freq: Once | INTRAVENOUS | Status: AC | PRN
  Administered 2022-02-09: 10 mL via INTRAVENOUS

## 2022-02-13 ENCOUNTER — Telehealth: Payer: Self-pay | Admitting: Internal Medicine

## 2022-02-13 DIAGNOSIS — K769 Liver disease, unspecified: Secondary | ICD-10-CM

## 2022-02-13 NOTE — Telephone Encounter (Signed)
Pt called in regards to her MRI results.  I read what Dr. Biagio Quint stated.  Pt states she does not have h/o cancer. Pt is agreeable to a referral to GI.

## 2022-02-13 NOTE — Telephone Encounter (Signed)
Pt would like to be called in regards to an MRI she had. Pt would like to know the results

## 2022-02-14 NOTE — Addendum Note (Signed)
Addended by: Caryl Bis Clevester Helzer G on: 02/14/2022 12:58 PM   Modules accepted: Orders

## 2022-02-14 NOTE — Telephone Encounter (Signed)
Referral placed.

## 2022-02-15 ENCOUNTER — Ambulatory Visit (INDEPENDENT_AMBULATORY_CARE_PROVIDER_SITE_OTHER): Payer: No Typology Code available for payment source | Admitting: Obstetrics and Gynecology

## 2022-02-15 ENCOUNTER — Telehealth: Payer: Self-pay | Admitting: Obstetrics and Gynecology

## 2022-02-15 ENCOUNTER — Ambulatory Visit (INDEPENDENT_AMBULATORY_CARE_PROVIDER_SITE_OTHER): Payer: No Typology Code available for payment source

## 2022-02-15 ENCOUNTER — Encounter: Payer: Self-pay | Admitting: Obstetrics and Gynecology

## 2022-02-15 VITALS — BP 130/82 | HR 88 | Ht 66.0 in | Wt 250.0 lb

## 2022-02-15 DIAGNOSIS — Z01812 Encounter for preprocedural laboratory examination: Secondary | ICD-10-CM

## 2022-02-15 DIAGNOSIS — N840 Polyp of corpus uteri: Secondary | ICD-10-CM

## 2022-02-15 DIAGNOSIS — D259 Leiomyoma of uterus, unspecified: Secondary | ICD-10-CM

## 2022-02-15 DIAGNOSIS — Z3009 Encounter for other general counseling and advice on contraception: Secondary | ICD-10-CM

## 2022-02-15 DIAGNOSIS — N920 Excessive and frequent menstruation with regular cycle: Secondary | ICD-10-CM

## 2022-02-15 LAB — PREGNANCY, URINE: Preg Test, Ur: NEGATIVE

## 2022-02-15 MED ORDER — IBUPROFEN 200 MG PO TABS: 800.0000 mg | ORAL_TABLET | Freq: Once | ORAL | Status: DC

## 2022-02-15 NOTE — Addendum Note (Signed)
Addended by: Yates Decamp on: 02/15/2022 01:43 PM   Modules accepted: Orders

## 2022-02-15 NOTE — Progress Notes (Signed)
GYNECOLOGY  VISIT   HPI: 46 y.o.   Single Black or African American Not Hispanic or Latino  female   G0P0000 with Patient's last menstrual period was 01/02/2022 (exact date).   here for a sonohysterogram. She has menorrhagia and was noted to have an enlarging fibroid uterus on imaging last month. The evaluation of her endometrium was limited because of the fibroids.    GYNECOLOGIC HISTORY: Patient's last menstrual period was 01/02/2022 (exact date). Contraception:micronor, not currently active Menopausal hormone therapy: no        OB History     Gravida  0   Para  0   Term  0   Preterm  0   AB  0   Living  0      SAB  0   IAB  0   Ectopic  0   Multiple  0   Live Births  0              Patient Active Problem List   Diagnosis Date Noted   Liver lesion 01/24/2022   Prediabetes 01/05/2022   BMI 40.0-44.9, adult ([place]) 12/29/2021   Class 3 severe obesity due to excess calories with serious comorbidity and body mass index (BMI) of 40.0 to 44.9 in adult [facility]) 12/29/2021   Seborrheic dermatitis 03/11/2020   Laryngitis 03/11/2020   Hypertension 01/21/2020   Annual physical exam 11/07/2018   Allergic rhinitis 11/07/2018   Stress 03/07/2018   Depression 03/07/2018   Vitamin D deficiency 06/24/2017   Morbid obesity with BMI of 40.0-44.9, adult ([place]) 06/07/2017   IDA (iron deficiency anemia) 10/04/2016    Past Medical History:  Diagnosis Date   Anemia    Depression    Dysmenorrhea    Fibroid    History of blood transfusion 09/2016   [facility] - 2 units transfused   Hypertension    Insomnia    Iron deficiency anemia    Stress     Past Surgical History:  Procedure Laterality Date   DILATATION & CURETTAGE/HYSTEROSCOPY WITH MYOSURE N/A 11/13/2016   Procedure: DILATATION & CURETTAGE/HYSTEROSCOPY WITH MYOSURE;  Surgeon: Salvadore Dom, MD;  Location: [facility];  Service: Gynecology;  Laterality: N/A;   DILATION AND CURETTAGE OF UTERUS     OTHER SURGICAL HISTORY      cervical polyp removal     Current Outpatient Medications  Medication Sig Dispense Refill   Cholecalciferol 1.25 MG (50000 UT) capsule Take 1 capsule (50,000 Units total) by mouth once a week. d3 (Patient not taking: Reported on 01/24/2022) 13 capsule 1   ibuprofen (ADVIL) 800 MG tablet Take 1 tablet (800 mg total) by mouth every 8 (eight) hours as needed. 30 tablet 1   losartan-hydrochlorothiazide (HYZAAR) 50-12.5 MG tablet Take 1 tablet by mouth daily. In am 90 tablet 3   norethindrone (MICRONOR) 0.35 MG tablet Take 1 tablet (0.35 mg total) by mouth daily. 84 tablet 0   No current facility-administered medications for this visit.     ALLERGIES: Patient has no known allergies.  Family History  Problem Relation Age of Onset   Diabetes Mother    Hypertension Mother    Arthritis Mother    Asthma Mother    COPD Father    Breast cancer Maternal Grandmother        mat great gm    Social History   Socioeconomic History   Marital status: Single    Spouse name: Not on file   Number of children: Not on file  Years of education: Not on file   Highest education level: Not on file  Occupational History   Not on file  Tobacco Use   Smoking status: Never   Smokeless tobacco: Never  Vaping Use   Vaping Use: Never used  Substance and Sexual Activity   Alcohol use: No   Drug use: No   Sexual activity: Not Currently    Birth control/protection: Pill  Other Topics Concern   Not on file  Social History Narrative   In school for criminal justice   Works Education officer, 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    Masters degree [place] wants to be Education officer, community office after covid 53 over    No kids    Lives in [place]    No guns    Wears seat belt    Safe in relationship    Social Determinants of Health   Financial Resource Strain: Not on file  Food Insecurity: Not on file  Transportation Needs: Not on file  Physical Activity: Not on file  Stress:  Not on file  Social Connections: Not on file  Intimate Partner Violence: Not on file    ROS  PHYSICAL EXAMINATION:    LMP 01/02/2022 (Exact Date)     General appearance: alert, cooperative and appears stated age Neck: supple, no thyromegaly Heart: regular rate and rhythm Lungs: CTAB Abdomen: soft, non-tender; bowel sounds normal; no masses,  no organomegaly Extremities: normal, atraumatic, no cyanosis Skin: normal color, texture and turgor, no rashes or lesions Lymph: normal cervical supraclavicular and inguinal nodes Neurologic: grossly normal   Pelvic: External genitalia:  no lesions              Urethra:  normal appearing urethra with no masses, tenderness or lesions              Bartholins and Skenes: normal                 Vagina: normal appearing vagina with normal color and discharge, no lesions              Cervix: no lesions               Sonohysterogram The procedure and risks of the procedure were reviewed with the patient, consent form was signed. A speculum was placed in the vagina and the cervix was cleansed with betadine. The sonohysterogram catheter was inserted into the uterine cavity without difficulty. Saline was infused under direct observation with the ultrasound. A 6 cm fibroid deviates her cavity some, not amenable to resection. A large endometrial polyp was also noted.The catheter was removed.   Chaperone was present for exam.  1. Menorrhagia with regular cycle She has a fibroid uterus, one fibroid deviates her cavity, not amenable to resection.  2. Uterine leiomyoma, unspecified location  3. Endometrial polyp Large endometrial polyp noted Plan: hysteroscopy, polypectomy, dilation and curettage and mirena IUD insertion. Reviewed risks, including: bleeding, infection, uterine perforation, fluid overload, need for further surgery. The fibroid may increase the risk of IUD expulsion  4. General counseling and advice on female contraception Desires  mirena IUD

## 2022-02-15 NOTE — Telephone Encounter (Signed)
Surgery: CPT [phone] - Hysteroscopy/D&C/Myosure,  mirena IUD insertion  Diagnosis: N93.9 Abnormal Uterine Bleeding, N84.0 Endometrial Polyp, D25.9 Fibroids  Location: [place]  Status: Outpatient  Time: 30 Minutes  Assistant: N/A  Urgency: First Available  Pre-Op Appointment: Completed  Post-Op Appointment(s): 2 Weeks  Time Out Of Work: Day Of Surgery, 1 Day Post Op

## 2022-02-21 NOTE — Telephone Encounter (Signed)
Left message to call Knut Rondinelli, RN at [facility], [phone].  

## 2022-03-22 ENCOUNTER — Encounter: Payer: Self-pay | Admitting: Internal Medicine

## 2022-03-23 ENCOUNTER — Encounter: Payer: Self-pay | Admitting: Family

## 2022-03-30 ENCOUNTER — Telehealth: Payer: Self-pay | Admitting: *Deleted

## 2022-03-30 DIAGNOSIS — D259 Leiomyoma of uterus, unspecified: Secondary | ICD-10-CM

## 2022-03-30 DIAGNOSIS — N920 Excessive and frequent menstruation with regular cycle: Secondary | ICD-10-CM

## 2022-03-30 DIAGNOSIS — N840 Polyp of corpus uteri: Secondary | ICD-10-CM

## 2022-03-30 NOTE — Telephone Encounter (Signed)
Left message to call Sharee Pimple, RN at [facility], [phone].   Reviewed Dx codes with Dr. Talbert Nan 03/02/22.  N92.0 for Mirena IUD.

## 2022-03-30 NOTE — Telephone Encounter (Signed)
Encounter previously closed.   See open encounter dated 03/30/22.

## 2022-03-30 NOTE — Telephone Encounter (Signed)
Me      03/30/22  9:21 AM Note Encounter previously closed.    See open encounter dated 03/30/22.      Burnice Logan, RN Registered Nurse   Telephone Encounter Signed   Creation Time: 02/21/2022 10:57 AM   Signed     Left message to call Sharee Pimple, RN at [place] [facility], [phone].             Tag   Copy   Salvadore Dom, MD  Physician Gynecology   Telephone Encounter Signed   Creation Time: 02/15/2022 12:11 PM   Signed     Surgery: CPT [phone] - Hysteroscopy/D&C/Myosure,  mirena IUD insertion   Diagnosis: N93.9 Abnormal Uterine Bleeding, N84.0 Endometrial Polyp, D25.9 Fibroids   Location: [facility]   Status: Outpatient   Time: 30 Minutes   Assistant: N/A   Urgency: First Available   Pre-Op Appointment: Completed   Post-Op Appointment(s): 2 Weeks   Time Out Of Work: Day Of Surgery, 1 Day Post Op

## 2022-04-10 NOTE — Telephone Encounter (Signed)
No response from patient.  MyChart message sent.

## 2022-04-26 ENCOUNTER — Encounter: Payer: Self-pay | Admitting: Family

## 2022-04-27 ENCOUNTER — Telehealth: Payer: Self-pay | Admitting: Internal Medicine

## 2022-04-27 NOTE — Telephone Encounter (Signed)
Pt called in saying that she was Dr. Aundra Miles pt, however back in December Dr. Derrel Nip send her a message concerning a mammogram, and now she wants to f/u with that and also she needs an order for it. She's available '@[phone]'$ [phone].

## 2022-04-27 NOTE — Telephone Encounter (Signed)
Spoke with patient.  Patient request to proceed with recommended surgery with Dr. Dellis Filbert. Pre-op scheduled for 2/28 at 1:45pm.   Reviewed surgery dates, would like to proceed with 06/09/22. Advised I will send to Dr. Dellis Filbert to review and return call once surgery scheduled. Patient agreeable.   Dr. Dellis Filbert -please send surgery request.   Cc: Dr. Talbert Nan

## 2022-04-28 ENCOUNTER — Other Ambulatory Visit: Payer: Self-pay

## 2022-04-28 DIAGNOSIS — Z1231 Encounter for screening mammogram for malignant neoplasm of breast: Secondary | ICD-10-CM

## 2022-04-28 NOTE — Telephone Encounter (Signed)
Faxed to [facility]

## 2022-04-28 NOTE — Telephone Encounter (Signed)
ordered

## 2022-05-11 NOTE — Telephone Encounter (Signed)
Dr. Dellis Filbert reviewed, request referral for surgery.  Dr. Talbert Nan -please review and advise.

## 2022-05-15 ENCOUNTER — Other Ambulatory Visit: Payer: Self-pay

## 2022-05-15 DIAGNOSIS — N6310 Unspecified lump in the right breast, unspecified quadrant: Secondary | ICD-10-CM

## 2022-05-16 NOTE — Telephone Encounter (Signed)
Please refer to Dr Sabra Heck or Dr Currie Paris.

## 2022-05-16 NOTE — Telephone Encounter (Signed)
Left message to call Sharee Pimple, RN at [facility], [phone], OPT 5 in f/u to surgery recommendations.

## 2022-05-17 ENCOUNTER — Encounter: Payer: Self-pay | Admitting: Obstetrics and Gynecology

## 2022-05-17 DIAGNOSIS — N920 Excessive and frequent menstruation with regular cycle: Secondary | ICD-10-CM

## 2022-05-17 MED ORDER — NORETHINDRONE 0.35 MG PO TABS: 1.0000 | ORAL_TABLET | Freq: Every day | ORAL | 1 refills | Status: DC

## 2022-05-17 NOTE — Telephone Encounter (Signed)
Refer to encounter 03/30/2022. Pt was referred to Dr. Sabra Heck for surgery/IUD insertion. Please advise.  Rx pend.

## 2022-05-17 NOTE — Telephone Encounter (Signed)
Spoke with patient, advised per Dr. Talbert Nan.  Patient request referral to Dr. Sabra Heck, order placed. Patient aware their office will contact her directly to schedule. Questions answered.   Routing to [address] to f/u on referral.

## 2022-05-23 NOTE — Telephone Encounter (Signed)
Per review of EPIC, patient is scheduled with Dr. Sabra Heck on 07/27/22.   Routing to provider for final review. Patient is agreeable to disposition. Will close encounter.  Cc: Wyatt Portela

## 2022-05-24 ENCOUNTER — Encounter: Payer: No Typology Code available for payment source | Admitting: Obstetrics & Gynecology

## 2022-07-06 ENCOUNTER — Ambulatory Visit (INDEPENDENT_AMBULATORY_CARE_PROVIDER_SITE_OTHER): Payer: PRIVATE HEALTH INSURANCE | Admitting: Gastroenterology

## 2022-07-06 ENCOUNTER — Encounter: Payer: Self-pay | Admitting: Family Medicine

## 2022-07-06 ENCOUNTER — Encounter: Payer: Self-pay | Admitting: Family

## 2022-07-06 ENCOUNTER — Ambulatory Visit (INDEPENDENT_AMBULATORY_CARE_PROVIDER_SITE_OTHER): Payer: PRIVATE HEALTH INSURANCE | Admitting: Family Medicine

## 2022-07-06 ENCOUNTER — Encounter: Payer: Self-pay | Admitting: Gastroenterology

## 2022-07-06 VITALS — BP 130/70 | HR 92 | Temp 98.9°F | Ht 66.0 in | Wt 252.2 lb

## 2022-07-06 VITALS — BP 132/79 | HR 78 | Temp 98.9°F | Wt 256.0 lb

## 2022-07-06 DIAGNOSIS — R7303 Prediabetes: Secondary | ICD-10-CM

## 2022-07-06 DIAGNOSIS — D509 Iron deficiency anemia, unspecified: Secondary | ICD-10-CM

## 2022-07-06 DIAGNOSIS — R932 Abnormal findings on diagnostic imaging of liver and biliary tract: Secondary | ICD-10-CM

## 2022-07-06 DIAGNOSIS — R7989 Other specified abnormal findings of blood chemistry: Secondary | ICD-10-CM | POA: Diagnosis not present

## 2022-07-06 DIAGNOSIS — Z1231 Encounter for screening mammogram for malignant neoplasm of breast: Secondary | ICD-10-CM | POA: Diagnosis not present

## 2022-07-06 DIAGNOSIS — E559 Vitamin D deficiency, unspecified: Secondary | ICD-10-CM

## 2022-07-06 DIAGNOSIS — I10 Essential (primary) hypertension: Secondary | ICD-10-CM | POA: Diagnosis not present

## 2022-07-06 DIAGNOSIS — Z6841 Body Mass Index (BMI) 40.0 and over, adult: Secondary | ICD-10-CM | POA: Diagnosis not present

## 2022-07-06 LAB — COMPREHENSIVE METABOLIC PANEL
ALT: 13 U/L (ref 0–35)
AST: 14 U/L (ref 0–37)
Albumin: 3.9 g/dL (ref 3.5–5.2)
Alkaline Phosphatase: 240 U/L — ABNORMAL HIGH (ref 39–117)
BUN: 9 mg/dL (ref 6–23)
CO2: 24 mEq/L (ref 19–32)
Calcium: 10.7 mg/dL — ABNORMAL HIGH (ref 8.4–10.5)
Chloride: 109 mEq/L (ref 96–112)
Creatinine, Ser: 0.66 mg/dL (ref 0.40–1.20)
GFR: 105.78 mL/min (ref >60.00)
Glucose, Bld: 147 mg/dL — ABNORMAL HIGH (ref 70–99)
Potassium: 4.2 mEq/L (ref 3.5–5.1)
Sodium: 138 mEq/L (ref 135–145)
Total Bilirubin: 0.2 mg/dL (ref 0.2–1.2)
Total Protein: 6.4 g/dL (ref 6.0–8.3)

## 2022-07-06 LAB — CBC WITH DIFFERENTIAL/PLATELET
Basophils Absolute: 0 10*3/uL (ref 0.0–0.1)
Basophils Relative: 0.4 % (ref 0.0–3.0)
Eosinophils Absolute: 0.1 10*3/uL (ref 0.0–0.7)
Eosinophils Relative: 1.7 % (ref 0.0–5.0)
HCT: 35.6 % — ABNORMAL LOW (ref 36.0–46.0)
Hemoglobin: 11.7 g/dL — ABNORMAL LOW (ref 12.0–15.0)
Lymphocytes Relative: 39.2 % (ref 12.0–46.0)
Lymphs Abs: 1.7 10*3/uL (ref 0.7–4.0)
MCHC: 32.8 g/dL (ref 30.0–36.0)
MCV: 86.5 fl (ref 78.0–100.0)
Monocytes Absolute: 0.3 10*3/uL (ref 0.1–1.0)
Monocytes Relative: 6.2 % (ref 3.0–12.0)
Neutro Abs: 2.2 10*3/uL (ref 1.4–7.7)
Neutrophils Relative %: 52.5 % (ref 43.0–77.0)
Platelets: 324 10*3/uL (ref 150.0–400.0)
RBC: 4.11 Mil/uL (ref 3.87–5.11)
RDW: 15.7 % — ABNORMAL HIGH (ref 11.5–15.5)
WBC: 4.2 10*3/uL (ref 4.0–10.5)

## 2022-07-06 LAB — LIPID PANEL
Cholesterol: 184 mg/dL (ref 0–200)
HDL: 67.2 mg/dL (ref >39.00)
LDL Cholesterol: 102 mg/dL — ABNORMAL HIGH (ref 0–99)
NonHDL: 116.45
Total CHOL/HDL Ratio: 3
Triglycerides: 73 mg/dL (ref 0.0–149.0)
VLDL: 14.6 mg/dL (ref 0.0–40.0)

## 2022-07-06 LAB — TSH: TSH: 0.7 u[IU]/mL (ref 0.35–5.50)

## 2022-07-06 LAB — VITAMIN D 25 HYDROXY (VIT D DEFICIENCY, FRACTURES): VITD: 30.25 ng/mL (ref 30.00–100.00)

## 2022-07-06 LAB — HEMOGLOBIN A1C: Hgb A1c MFr Bld: 5.2 % (ref 4.6–6.5)

## 2022-07-06 LAB — VITAMIN B12: Vitamin B-12: 315 pg/mL (ref 211–911)

## 2022-07-06 NOTE — Patient Instructions (Signed)
Please arrive 15 minutes before your MRI. Please do not eat or drink 4 hours before your MRI.

## 2022-07-06 NOTE — Addendum Note (Signed)
Addended by: Adela Ports on: 07/06/2022 01:43 PM   Modules accepted: Orders

## 2022-07-06 NOTE — Progress Notes (Signed)
Wyline Mood MD, MRCP(U.K) [address]  [address]  [place], [place] [address]  Main: [phone]  Fax: [phone]   Gastroenterology Consultation  Referring Provider:     Glori Luis, MD Primary Care Physician:  Dana Allan, MD Primary Gastroenterologist:  Dr. Wyline Mood  Reason for Consultation:     Abnormal lesion        HPI:   Stacy Miles is a 47 y.o. y/o female referred for abnormal liver lesion seen on imaging.  Back in October 2023 patient was noted to have elevated alkaline phosphatase and imaging was obtained of the liver that showed ill-defined hypoechoic mass in the right hepatic lobe measuring 2.6 cm subsequently MRI of the abdomen was obtained in November 2023 that showed multiple lesions in the right hepatic lobe with assessment limited due to background steatosis.  Repeat MRI without and with Eovist contrast in 3 to 6 months was recommended.  That is yet to be obtained. October 2023 alkaline phosphatase 203 otherwise LFTs normal since then not been repeated.   She denies any past liver disease.  No family history of liver disease.  No illegal drug use.  No excess alcohol consumption no Financial planner.  She has a tattoo which is professionally performed.  No other symptoms.  Blood transfusion in 2018.  No travel outside the country.  Denies any abdominal pain.  This is the heaviest she has weighed.  She does use birth control pills. Past Medical History:  Diagnosis Date   Anemia    Depression    Dysmenorrhea    Fibroid    History of blood transfusion 09/2016   [facility] - 2 units transfused   Hypertension    Insomnia    Iron deficiency anemia    Stress     Past Surgical History:  Procedure Laterality Date   DILATATION & CURETTAGE/HYSTEROSCOPY WITH MYOSURE N/A 11/13/2016   Procedure: DILATATION & CURETTAGE/HYSTEROSCOPY WITH MYOSURE;  Surgeon: Romualdo Bolk, MD;  Location: [facility];  Service: Gynecology;  Laterality: N/A;   DILATION AND CURETTAGE  OF UTERUS     OTHER SURGICAL HISTORY     cervical polyp removal     Prior to Admission medications   Medication Sig Start Date End Date Taking? Authorizing Provider  Cholecalciferol 1.25 MG (50000 UT) capsule Take 1 capsule (50,000 Units total) by mouth once a week. d3 01/05/22   McLean-Scocuzza, Pasty Spillers, MD  ibuprofen (ADVIL) 800 MG tablet Take 1 tablet (800 mg total) by mouth every 8 (eight) hours as needed. 12/15/21   Romualdo Bolk, MD  losartan-hydrochlorothiazide (HYZAAR) 50-12.5 MG tablet Take 1 tablet by mouth daily. In am Patient not taking: Reported on 07/06/2022 12/29/21   McLean-Scocuzza, Pasty Spillers, MD  norethindrone (MICRONOR) 0.35 MG tablet Take 1 tablet (0.35 mg total) by mouth daily. 05/17/22   Romualdo Bolk, MD    Family History  Problem Relation Age of Onset   Diabetes Mother    Hypertension Mother    Arthritis Mother    Asthma Mother    COPD Father    Breast cancer Maternal Grandmother        mat great gm     Social History   Tobacco Use   Smoking status: Never   Smokeless tobacco: Never  Vaping Use   Vaping Use: Never used  Substance Use Topics   Alcohol use: No   Drug use: No    Allergies as of 07/06/2022   (No Known Allergies)  Review of Systems:    All systems reviewed and negative except where noted in HPI.   Physical Exam:  BP (!) 141/74   Pulse 78   Temp 98.9 F (37.2 C)   Wt 256 lb (116.1 kg)   LMP 06/25/2022 (Exact Date)   HC 66" (167.6 cm)   BMI 41.32 kg/m  Patient's last menstrual period was 06/25/2022 (exact date). Psych:  Alert and cooperative. Normal mood and affect. General:   Alert,  Well-developed, well-nourished, pleasant and cooperative in NAD Head:  Normocephalic and atraumatic. Eyes:  Sclera clear, no icterus.   Conjunctiva pink. Ears:  Normal auditory acuity. Neck:  Supple; no masses or thyromegaly. Lungs:  Respirations even and unlabored.  Clear throughout to auscultation.   No wheezes, crackles, or  rhonchi. No acute distress. Heart:  Regular rate and rhythm; no murmurs, clicks, rubs, or gallops. Abdomen:  Normal bowel sounds.  No bruits.  Soft, non-tender and non-distended without masses, hepatosplenomegaly or hernias noted.  No guarding or rebound tenderness.    Neurologic:  Alert and oriented x3;  grossly normal neurologically. Psych:  Alert and cooperative. Normal mood and affect.  Imaging Studies: No results found.  Assessment and Plan:   Stacy Miles is a 47 y.o. y/o female has been referred for normal liver lesion incidentally found on ultrasound and MRI as part of process to workup an isolated alkaline phosphatase elevation back in October 2023.  Multiple blood tests were ordered subsequently patient has not obtained them.  The imaging was inconclusive and recommended repeat imaging in 3 to 6 months months which she is due at this point of time Plan 1.  Repeat CMP, GGT if alkaline phosphatase elevated will require full autoimmune and viral hepatitis workup. 2.  MRI with and without Eovist contrast 3.  She is on birth control and depending the results of the MRI may need to advise on continuation of birth control pills versus having her shift to other nonhormonal options if the imaging warrants.  Follow up in 1-2 months with myself or APP  Dr Wyline Mood MD,MRCP(U.K)

## 2022-07-06 NOTE — Progress Notes (Signed)
SUBJECTIVE:   Chief Complaint  Patient presents with   Transitions Of Care   HPI Patient presents to clinic to transfer care.  No acute concerns today.  Hypertension Asymptomatic.  Was previously prescribed Hyzaar 50-12.5 mg daily but self discontinued in October 2023 as she felt her blood pressure was okay without it.  Does not check blood pressure at home.  Denies any headaches, visual changes, chest pain, shortness of breath or lower extremity edema.  OCP Follows with OB/GYN for heavy menses.  Currently on Micronor 0.35 mg daily.  Previously seen Dr. Oscar La, who is now retiring.  She has schedule appointment with OB/GYN in May.  PERTINENT PMH / PSH: Hypertension IDA Morbid obesity Prediabetes   OBJECTIVE:  BP 130/70   Pulse 92   Temp 98.9 F (37.2 C) (Oral)   Ht  (1.676 m)   Wt 252 lb 3.2 oz (114.4 kg)   LMP 06/25/2022 (Exact Date)   SpO2 98%   BMI 40.71 kg/m    Physical Exam Constitutional:      General: She is not in acute distress.    Appearance: She is normal weight. She is not ill-appearing.  HENT:     Head: Normocephalic.  Eyes:     Conjunctiva/sclera: Conjunctivae normal.  Neck:     Thyroid: No thyromegaly or thyroid tenderness.  Cardiovascular:     Rate and Rhythm: Normal rate and regular rhythm.     Pulses: Normal pulses.  Pulmonary:     Effort: Pulmonary effort is normal.     Breath sounds: Normal breath sounds.  Abdominal:     General: Bowel sounds are normal.  Neurological:     Mental Status: She is alert. Mental status is at baseline.  Psychiatric:        Mood and Affect: Mood normal.        Behavior: Behavior normal.        Thought Content: Thought content normal.        Judgment: Judgment normal.     ASSESSMENT/PLAN:  Primary hypertension Assessment & Plan: Chronic.  Self discontinued Hyzaar.  Current blood pressure at goal. Will continue to monitor blood pressure at home.  Goal less than 130/80. Discussed with patient  if blood pressure continues to rise may need to restart antihypertensives. Check c-Met   Orders: -     Comprehensive metabolic panel  Iron deficiency anemia, unspecified iron deficiency anemia type Assessment & Plan: Chronic.  Asymptomatic.  Does have heavy manses but is currently on Micronor and follows with OB/GYN.  Mentzer score greater than 13 indicating IDA. Ferritin and iron low will start ferrous sulfate 325 mg every other day    Orders: -     Iron, TIBC and Ferritin Panel  Breast cancer screening by mammogram -     3D Screening Mammogram, Left and Right; Future  Morbid obesity with BMI of 40.0-44.9, adult Assessment & Plan: Chronic.   Open discussions today encourage weight loss Will check labs today  Orders: -     Hemoglobin A1c -     CBC with Differential/Platelet -     Lipid panel -     TSH -     Vitamin B12 -     VITAMIN D 25 Hydroxy (Vit-D Deficiency, Fractures)  Prediabetes Assessment & Plan: Chronic. Check A1c   Vitamin D deficiency Assessment & Plan: Chronic.  Currently on supplements Check vitamin D levels   HCM Colonoscopy scheduled for today Mammogram referral sent Declined hepatitis  C/HIV screening Last Pap 9/22, NILM, HPV negative.  Due 9/25.  Follows with OB/GYN Tdap up-to-date. Received 2 doses HPV vaccine    PDMP reviewed  Return in about 6 months (around 01/05/2023) for PCP.  Dana Allan, MD

## 2022-07-06 NOTE — Patient Instructions (Addendum)
It was a pleasure meeting you today. Thank you for allowing me to take part in your health care.  Our goals for today as we discussed include:  We will get some labs today.  If they are abnormal or we need to do something about them, I will call you.  If they are normal, I will send you a message on MyChart (if it is active) or a letter in the mail.  If you don't hear from [place] in 2 weeks, please call the office at the number below.   Monitor blood pressure at home.  Goal less than 130/80.  Referral sent for Mammogram. Please call to schedule appointment. [facility] [address] [place], [place] [address] [phone]   Follow-up with OB/GYN.  Follow-up with GI for colonoscopy.    If you have any questions or concerns, please do not hesitate to call the office at [phone].  I look forward to our next visit and until then take care and stay safe.  Regards,   Dana Allan, MD   [facility]

## 2022-07-07 LAB — COMPREHENSIVE METABOLIC PANEL
ALT: 15 IU/L (ref 0–32)
AST: 16 IU/L (ref 0–40)
Albumin/Globulin Ratio: 1.6 (ref 1.2–2.2)
Albumin: 3.9 g/dL (ref 3.9–4.9)
Alkaline Phosphatase: 267 IU/L — ABNORMAL HIGH (ref 44–121)
BUN/Creatinine Ratio: 11 (ref 9–23)
BUN: 7 mg/dL (ref 6–24)
Bilirubin Total: <0.2 mg/dL (ref 0.0–1.2)
CO2: 20 mmol/L (ref 20–29)
Calcium: 11 mg/dL — ABNORMAL HIGH (ref 8.7–10.2)
Chloride: 109 mmol/L — ABNORMAL HIGH (ref 96–106)
Creatinine, Ser: 0.64 mg/dL (ref 0.57–1.00)
Globulin, Total: 2.5 g/dL (ref 1.5–4.5)
Glucose: 133 mg/dL — ABNORMAL HIGH (ref 70–99)
Potassium: 4.4 mmol/L (ref 3.5–5.2)
Sodium: 141 mmol/L (ref 134–144)
Total Protein: 6.4 g/dL (ref 6.0–8.5)
eGFR: 111 mL/min/{1.73_m2} (ref >59)

## 2022-07-07 LAB — IRON,TIBC AND FERRITIN PANEL
%SAT: 9 % (calc) — ABNORMAL LOW (ref 16–45)
Ferritin: 15 ng/mL — ABNORMAL LOW (ref 16–232)
Iron: 36 ug/dL — ABNORMAL LOW (ref 40–190)
TIBC: 380 mcg/dL (calc) (ref 250–450)

## 2022-07-07 LAB — GAMMA GT: GGT: 16 IU/L (ref 0–60)

## 2022-07-07 NOTE — Progress Notes (Signed)
Stacy Miles inform that the alkaline phosphotase is elevated but GGT is normal hence its not related to the liver . No further GI work up for elevated alkaline phosphotase consider evaluation for non GI causes of elevation. Await MRI to evaluate liver cysts   Dr Wyline Mood MD,MRCP [facility]) Gastroenterology/Hepatology Pager: [phone]

## 2022-07-10 ENCOUNTER — Other Ambulatory Visit: Payer: Self-pay | Admitting: Family Medicine

## 2022-07-10 ENCOUNTER — Encounter: Payer: Self-pay | Admitting: Family Medicine

## 2022-07-10 MED ORDER — IRON (FERROUS SULFATE) 325 (65 FE) MG PO TABS: 325.0000 mg | ORAL_TABLET | ORAL | 3 refills | Status: DC

## 2022-07-12 ENCOUNTER — Other Ambulatory Visit: Payer: Self-pay | Admitting: Family Medicine

## 2022-07-12 MED ORDER — LOSARTAN POTASSIUM 50 MG PO TABS
50.0000 mg | ORAL_TABLET | Freq: Every day | ORAL | 0 refills | Status: DC
Start: 2022-07-12 — End: 2022-11-15

## 2022-07-13 ENCOUNTER — Ambulatory Visit: Admission: RE | Admit: 2022-07-13 | Payer: PRIVATE HEALTH INSURANCE | Source: Ambulatory Visit

## 2022-07-14 ENCOUNTER — Telehealth: Payer: Self-pay | Admitting: Family Medicine

## 2022-07-14 NOTE — Telephone Encounter (Signed)
error 

## 2022-07-15 ENCOUNTER — Encounter: Payer: Self-pay | Admitting: Family Medicine

## 2022-07-15 DIAGNOSIS — Z1231 Encounter for screening mammogram for malignant neoplasm of breast: Secondary | ICD-10-CM | POA: Insufficient documentation

## 2022-07-15 NOTE — Assessment & Plan Note (Signed)
Chronic.  Asymptomatic.  Does have heavy manses but is currently on Micronor and follows with OB/GYN.  Mentzer score greater than 13 indicating IDA. Ferritin and iron low will start ferrous sulfate 325 mg every other day

## 2022-07-15 NOTE — Assessment & Plan Note (Addendum)
Chronic.  Self discontinued Hyzaar.  Current blood pressure at goal. Will continue to monitor blood pressure at home.  Goal less than 130/80. Discussed with patient if blood pressure continues to rise may need to restart antihypertensives. Check c-Met

## 2022-07-15 NOTE — Assessment & Plan Note (Signed)
Chronic.  Currently on supplements. Check vitamin D levels 

## 2022-07-15 NOTE — Assessment & Plan Note (Signed)
Ensure patient is not taking Hyzaar Start losartan 50 mg daily Follow-up in RN clinic in 1 week.

## 2022-07-15 NOTE — Assessment & Plan Note (Signed)
Chronic Check A1c 

## 2022-07-15 NOTE — Assessment & Plan Note (Signed)
Chronic.   Open discussions today encourage weight loss Will check labs today

## 2022-07-20 ENCOUNTER — Ambulatory Visit
Admission: RE | Admit: 2022-07-20 | Discharge: 2022-07-20 | Disposition: A | Discharge status: Home / Self Care | Payer: PRIVATE HEALTH INSURANCE | Source: Ambulatory Visit | Attending: Gastroenterology | Admitting: Gastroenterology

## 2022-07-20 DIAGNOSIS — R932 Abnormal findings on diagnostic imaging of liver and biliary tract: Secondary | ICD-10-CM | POA: Diagnosis not present

## 2022-07-20 DIAGNOSIS — R7989 Other specified abnormal findings of blood chemistry: Secondary | ICD-10-CM | POA: Diagnosis present

## 2022-07-20 MED ORDER — GADOXETATE DISODIUM 0.25 MMOL/ML IV SOLN
10.0000 mL | Freq: Once | INTRAVENOUS | Status: AC | PRN
  Administered 2022-07-20: 10 mL via INTRAVENOUS

## 2022-07-24 NOTE — Progress Notes (Signed)
Reepat MRI in 01/2023 as suggested by radiologist , appears likely benign per radiologist

## 2022-07-27 ENCOUNTER — Encounter (HOSPITAL_BASED_OUTPATIENT_CLINIC_OR_DEPARTMENT_OTHER): Payer: Self-pay | Admitting: Obstetrics & Gynecology

## 2022-07-27 ENCOUNTER — Ambulatory Visit (INDEPENDENT_AMBULATORY_CARE_PROVIDER_SITE_OTHER): Payer: PRIVATE HEALTH INSURANCE | Admitting: Obstetrics & Gynecology

## 2022-07-27 VITALS — BP 129/53 | HR 79 | Ht 66.0 in | Wt 255.6 lb

## 2022-07-27 DIAGNOSIS — N920 Excessive and frequent menstruation with regular cycle: Secondary | ICD-10-CM

## 2022-07-27 DIAGNOSIS — Z862 Personal history of diseases of the blood and blood-forming organs and certain disorders involving the immune mechanism: Secondary | ICD-10-CM | POA: Diagnosis not present

## 2022-07-27 DIAGNOSIS — D251 Intramural leiomyoma of uterus: Secondary | ICD-10-CM | POA: Diagnosis not present

## 2022-07-27 DIAGNOSIS — Z8742 Personal history of other diseases of the female genital tract: Secondary | ICD-10-CM

## 2022-07-27 DIAGNOSIS — N649 Disorder of breast, unspecified: Secondary | ICD-10-CM

## 2022-07-27 MED ORDER — NORETHINDRONE 0.35 MG PO TABS: 1.0000 | ORAL_TABLET | Freq: Every day | ORAL | 3 refills | Status: DC

## 2022-07-27 NOTE — Progress Notes (Signed)
GYNECOLOGY  VISIT  CC:   discuss treatment options for fibroids/bleeding  HPI: 47 y.o. G0P0000 Single Black or [place] American female here for discussion of treatment of fibroids uterus and bleeding.  Pt has hx of anemia as well.  She has been taking oral iron and does have hx of blood transfusions in 2018.  She has been treated with micronor and cycles are lighter however she does have bulk symptoms.  Most recent imaging was 01/25/2022 showing uterus measuring 13.3 x 11.1 x 7.8cm with 6.3 x 5.9cm intramural fibroid deviating the endometrial cavity.  Several other fibroids are present as well.  (Reviewed images.) Hysterectomy was recommended and pt was referred to me for surgical management.    Upon discussing this with pt, she admits that she really does not want surgery if at all possible.  We discussed treatments for fibroids.  Hysterectomy, myomectomy, uterine artery embolization.  Pt would like non surgical options.  Procedure for [place] discussed.  Pre procedure evaluation with MRI discussed.  She would like to proceed with referral.  She is going to continue micronor.     She does understand pregnancy after procedure is not recommended.  She voices clear understanding.  Last pap smear was 12/13/2020 and was normal.   Mammogram 05/11/22 with follow up imaging showed 3 x69mm nodule.  Biopsy was recommended.     Past Medical History:  Diagnosis Date   Anemia    Depression    Dysmenorrhea    Fibroid    History of blood transfusion 09/2016   [facility] - 2 units transfused   Hypertension    Insomnia    Iron deficiency anemia    Stress     MEDS:   Current Outpatient Medications on File Prior to Visit  Medication Sig Dispense Refill   Cholecalciferol 1.25 MG (50000 UT) capsule Take 1 capsule (50,000 Units total) by mouth once a week. d3 13 capsule 1   ibuprofen (ADVIL) 800 MG tablet Take 1 tablet (800 mg total) by mouth every 8 (eight) hours as needed. 30 tablet 1   Iron, Ferrous Sulfate, 325 (65  Fe) MG TABS Take 325 mg by mouth every other day. 90 tablet 3   losartan (COZAAR) 50 MG tablet Take 1 tablet (50 mg total) by mouth daily. 90 tablet 0   norethindrone (MICRONOR) 0.35 MG tablet Take 1 tablet (0.35 mg total) by mouth daily. 84 tablet 1   No current facility-administered medications on file prior to visit.    ALLERGIES: Patient has no known allergies.  SH:  single, non smoker  Review of Systems  Constitutional: Negative.   Genitourinary: Negative.     PHYSICAL EXAMINATION:    BP (!) 129/53 (BP Location: Left Arm, Patient Position: Sitting, Cuff Size: Large)   Pulse 79   Ht 5\' 6"  (1.676 m) Comment: Reported  Wt 255 lb 9.6 oz (115.9 kg)   LMP 06/25/2022 (Exact Date)   BMI 41.25 kg/m     General appearance: alert, cooperative and appears stated age   Assessment/Plan: 1. Fibroids, intramural - will refer to IR - Ambulatory referral to Interventional Radiology - pt aware will need pelvic MRI  2. History of menorrhagia - pt will continue micronor - norethindrone (MICRONOR) 0.35 MG tablet; Take 1 tablet (0.35 mg total) by mouth daily.  Dispense: 84 tablet; Refill: 3  3. History of anemia  4. Breast lesion - will plan follow-up for pt

## 2022-08-03 ENCOUNTER — Telehealth: Payer: Self-pay

## 2022-08-03 DIAGNOSIS — R932 Abnormal findings on diagnostic imaging of liver and biliary tract: Secondary | ICD-10-CM

## 2022-08-03 NOTE — Telephone Encounter (Signed)
Called patient to let her know the below information. Patient agreed and was happy to hear that it seems to be benign. I let her know that I would be scheduling it and sending her the information by mail even though she is able to see it on her MyChart. Patient had no further questions.

## 2022-08-03 NOTE — Telephone Encounter (Signed)
-----   Message from Wyline Mood, MD sent at 07/24/2022 10:29 AM EDT ----- Reepat MRI in 01/2023 as suggested by radiologist , appears likely benign per radiologist

## 2022-08-08 ENCOUNTER — Encounter (HOSPITAL_BASED_OUTPATIENT_CLINIC_OR_DEPARTMENT_OTHER): Payer: Self-pay | Admitting: Obstetrics & Gynecology

## 2022-08-09 ENCOUNTER — Telehealth (HOSPITAL_BASED_OUTPATIENT_CLINIC_OR_DEPARTMENT_OTHER): Payer: Self-pay | Admitting: *Deleted

## 2022-08-09 NOTE — Telephone Encounter (Signed)
-----   Message from Jerene Bears, MD sent at 07/30/2022  4:09 PM EDT ----- Regarding: referral to IR placed Kim Referral to IR was placed.  Can you make sure appt scheduled.  I did order MRI as well.  Can you precert?   Also, she had a breast mass that biopsy was recommended 2/15 but I can't find the biopsy result.  Can you ask her about this?  Thanks.  MSM

## 2022-08-09 NOTE — Telephone Encounter (Signed)
Called pt to clarify treatment for breast mass that was seen on 2/15. Pt states that it did not need to be biopsied. She states it was a breast cyst that was aspirated the day of the visit. Pt has appt scheduled for MRI and with IR.

## 2022-08-10 ENCOUNTER — Telehealth (HOSPITAL_BASED_OUTPATIENT_CLINIC_OR_DEPARTMENT_OTHER): Payer: Self-pay | Admitting: Obstetrics & Gynecology

## 2022-08-10 NOTE — Telephone Encounter (Signed)
Called patient and was unable to leave a message mail box full.

## 2022-08-16 ENCOUNTER — Ambulatory Visit
Admission: RE | Admit: 2022-08-16 | Discharge: 2022-08-16 | Disposition: A | Discharge status: Home / Self Care | Payer: PRIVATE HEALTH INSURANCE | Source: Ambulatory Visit | Attending: Obstetrics & Gynecology | Admitting: Obstetrics & Gynecology

## 2022-08-16 ENCOUNTER — Encounter: Payer: Self-pay | Admitting: Family

## 2022-08-16 DIAGNOSIS — Z8742 Personal history of other diseases of the female genital tract: Secondary | ICD-10-CM

## 2022-08-16 DIAGNOSIS — Z862 Personal history of diseases of the blood and blood-forming organs and certain disorders involving the immune mechanism: Secondary | ICD-10-CM

## 2022-08-16 DIAGNOSIS — D251 Intramural leiomyoma of uterus: Secondary | ICD-10-CM

## 2022-08-31 ENCOUNTER — Encounter (HOSPITAL_BASED_OUTPATIENT_CLINIC_OR_DEPARTMENT_OTHER): Payer: Self-pay | Admitting: *Deleted

## 2022-09-07 ENCOUNTER — Encounter: Payer: Self-pay | Admitting: Family

## 2022-09-07 ENCOUNTER — Ambulatory Visit
Admission: RE | Admit: 2022-09-07 | Discharge: 2022-09-07 | Disposition: A | Discharge status: Home / Self Care | Payer: PRIVATE HEALTH INSURANCE | Source: Ambulatory Visit | Attending: Obstetrics & Gynecology | Admitting: Obstetrics & Gynecology

## 2022-09-07 MED ORDER — GADOPICLENOL 0.5 MMOL/ML IV SOLN: 10.0000 mL | Freq: Once | INTRAVENOUS | Status: DC | PRN

## 2022-09-07 MED ORDER — GADOPICLENOL 0.5 MMOL/ML IV SOLN
10.0000 mL | Freq: Once | INTRAVENOUS | Status: AC | PRN
  Administered 2022-09-07: 10 mL via INTRAVENOUS

## 2022-09-12 ENCOUNTER — Other Ambulatory Visit: Payer: Self-pay | Admitting: Obstetrics & Gynecology

## 2022-09-12 DIAGNOSIS — D251 Intramural leiomyoma of uterus: Secondary | ICD-10-CM

## 2022-09-25 ENCOUNTER — Encounter (HOSPITAL_BASED_OUTPATIENT_CLINIC_OR_DEPARTMENT_OTHER): Payer: Self-pay | Admitting: *Deleted

## 2022-10-05 ENCOUNTER — Ambulatory Visit: Payer: PRIVATE HEALTH INSURANCE | Admitting: Gastroenterology

## 2022-10-10 ENCOUNTER — Ambulatory Visit
Admission: RE | Admit: 2022-10-10 | Discharge: 2022-10-10 | Disposition: A | Discharge status: Home / Self Care | Payer: PRIVATE HEALTH INSURANCE | Source: Ambulatory Visit | Attending: Obstetrics & Gynecology | Admitting: Obstetrics & Gynecology

## 2022-10-10 DIAGNOSIS — D251 Intramural leiomyoma of uterus: Secondary | ICD-10-CM

## 2022-10-10 HISTORY — PX: IR RADIOLOGIST EVAL & MGMT: IMG5224

## 2022-10-10 NOTE — Consult Note (Signed)
Chief Complaint: Patient was seen in consultation today for uterine fibroids at the request of Jerene Bears  Referring Physician(s): Valentina Shaggy S  History of Present Illness: Stacy Miles is a 47 y.o. female (G0, P0) with a history of symptomatic uterine fibroids.  Her cycles occur approximately every 28 days at regular intervals and last for approximately 5 days.  The first 2 days are extremely heavy.  She requires use of superabsorbent pads that she must change every 1-2 hours.  This is extremely inconvenient with her busy life.  She was initially diagnosed with fibroids in 2018.  Her blood loss is quite significant and has resulted in anemia.  Her most recent hemoglobin was 11.7.  She had a blood transfusion once in the past and is currently on iron supplementation.  Endometrial biopsy performed in August 2018 was negative.  Her most recent Pap smear was in September 22 and is also negative.  She has imaging documenting fibroids both by ultrasound in November 2023 and by MRI more recently on 09/08/2022.  No evidence of adenomyosis, ovarian mass or suspicious features.  Her symptoms are quite severe.  She scored a 69 out of 100 on the uterine fibroid symptom severity questionnaire.  Her quality of life is also negatively impacted she scored only 35 out of 100 on the quality of life questionnaire.  Past Medical History:  Diagnosis Date   Anemia    Depression    Dysmenorrhea    Fibroid    History of blood transfusion 09/2016   [facility] - 2 units transfused   Hypertension    Insomnia    Iron deficiency anemia    Stress     Past Surgical History:  Procedure Laterality Date   DILATATION & CURETTAGE/HYSTEROSCOPY WITH MYOSURE N/A 11/13/2016   Procedure: DILATATION & CURETTAGE/HYSTEROSCOPY WITH MYOSURE;  Surgeon: Romualdo Bolk, MD;  Location: [facility];  Service: Gynecology;  Laterality: N/A;   DILATION AND CURETTAGE OF UTERUS     IR RADIOLOGIST EVAL & MGMT  10/10/2022   OTHER  SURGICAL HISTORY     cervical polyp removal     Allergies: Patient has no known allergies.  Medications: Prior to Admission medications   Medication Sig Start Date End Date Taking? Authorizing Provider  Cholecalciferol 1.25 MG (50000 UT) capsule Take 1 capsule (50,000 Units total) by mouth once a week. d3 01/05/22   McLean-Scocuzza, Pasty Spillers, MD  ibuprofen (ADVIL) 800 MG tablet Take 1 tablet (800 mg total) by mouth every 8 (eight) hours as needed. 12/15/21   Romualdo Bolk, MD  Iron, Ferrous Sulfate, 325 (65 Fe) MG TABS Take 325 mg by mouth every other day. 07/10/22   Dana Allan, MD  losartan (COZAAR) 50 MG tablet Take 1 tablet (50 mg total) by mouth daily. 07/12/22   Dana Allan, MD  norethindrone (MICRONOR) 0.35 MG tablet Take 1 tablet (0.35 mg total) by mouth daily. 07/27/22   Jerene Bears, MD     Family History  Problem Relation Age of Onset   Diabetes Mother    Hypertension Mother    Arthritis Mother    Asthma Mother    COPD Father    Breast cancer Maternal Grandmother        mat great gm    Social History   Socioeconomic History   Marital status: Single    Spouse name: Not on file   Number of children: Not on file   Years of education: Not on file  Highest education level: Not on file  Occupational History   Not on file  Tobacco Use   Smoking status: Never   Smokeless tobacco: Never  Vaping Use   Vaping status: Never Used  Substance and Sexual Activity   Alcohol use: No   Drug use: No   Sexual activity: Not Currently    Birth control/protection: Pill  Other Topics Concern   Not on file  Social History Narrative   In school for criminal justice   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  All other systems are negative.  Review of Systems  Vital Signs: BP 135/73 (BP Location: Left Arm, Patient Position: Sitting, Cuff Size: Normal)   Pulse 82   Temp 98.6 F (37 C) (Oral)   Resp 14   Ht 5\' 6"  (1.676 m)   Wt 113.4 kg   SpO2 96%   BMI 40.35 kg/m     Physical Exam Constitutional:      Appearance: Normal appearance. She is obese.  HENT:     Head: Normocephalic and atraumatic.  Eyes:     General: No scleral icterus. Cardiovascular:     Rate and Rhythm: Normal rate.  Pulmonary:     Effort: Pulmonary effort is normal.  Abdominal:     General: There is no distension.     Palpations: Abdomen is soft.     Tenderness: There is no abdominal tenderness.  Skin:    General: Skin is warm and dry.  Neurological:     Mental Status: She is alert and oriented to person, place, and time.  Psychiatric:        Mood and Affect: Mood normal.        Behavior: Behavior normal.       Imaging: IR Radiologist Eval & Mgmt  Result Date: 10/10/2022 EXAM: NEW PATIENT OFFICE VISIT CHIEF COMPLAINT: SEE EPIC NOTE HISTORY OF PRESENT ILLNESS: SEE EPIC NOTE REVIEW OF SYSTEMS: SEE EPIC NOTE PHYSICAL EXAMINATION: SEE EPIC NOTE ASSESSMENT AND PLAN: SEE EPIC NOTE Electronically Signed   By: Malachy Moan M.D.   On: 10/10/2022 15:09    Labs:  CBC: Recent Labs    12/15/21 1140 01/05/22 0808 07/06/22 1040  WBC 4.7 4.5 4.2  HGB 13.3 13.4 11.7*  HCT 38.6 40.4 35.6*  PLT 280 309.0 324.0    COAGS: No results for input(s): "INR", "APTT" in the last 8760 hours.  BMP: Recent Labs    01/05/22 0808 07/06/22 1040 07/06/22 1348  NA 135 138 141  K 4.3 4.2 4.4  CL 105 109 109*  CO2 23 24 20   GLUCOSE 89  147* 133*  BUN 13 9 7   CALCIUM 10.7* 10.7* 11.0*  CREATININE 0.79 0.66 0.64    LIVER FUNCTION TESTS: Recent Labs    01/05/22 1610 07/06/22 1040 07/06/22 1348  BILITOT 0.4 0.2 <0.2  AST 19 14 16   ALT 17 13 15   ALKPHOS 203* 240* 267*  PROT 7.1 6.4 6.4  ALBUMIN 4.0 3.9 3.9    TUMOR MARKERS: No results for input(s): "AFPTM", "CEA", "CA199", "CHROMGRNA" in the last 8760 hours.  Assessment and Plan:  Very pleasant 47 year old female with symptomatic uterine fibroids.  Her primary symptoms are menorrhagia, dysmenorrhea and bulk related symptoms.  Her symptoms are severe, she scored a 69 out of a possible 100 on her uterine fibroid symptom severity score.  Her symptoms are also negatively impacting her quality of life.  She scored only 35 out of a possible 100 on the uterine fibroid health-related quality of life questionnaire.  She is currently not interested in hysterectomy at this time and is an excellent candidate for uterine artery embolization.  We discussed the risks, benefits and alternatives to uterine artery embolization with superior hypogastric nerve block.  She understands and is highly motivated to proceed.  1.) Please schedule for outpatient superior hypogastric nerve block and bilateral uterine artery embolization.  Thank you for this interesting consult.  I greatly enjoyed meeting Stacy Miles and look forward to participating in their care.  A copy of this report was sent to the requesting provider on this date.  Electronically Signed: Sterling Big 10/10/2022, 3:57 PM   I spent a total of 40 Minutes  in face to face in clinical consultation, greater than 50% of which was counseling/coordinating care for uterine fibroids.

## 2022-10-17 ENCOUNTER — Other Ambulatory Visit: Payer: Self-pay | Admitting: Interventional Radiology

## 2022-10-17 DIAGNOSIS — D251 Intramural leiomyoma of uterus: Secondary | ICD-10-CM

## 2022-11-08 ENCOUNTER — Telehealth: Payer: Self-pay

## 2022-11-08 MED ORDER — PROMETHAZINE HCL 12.5 MG PO TABS: 12.5000 mg | ORAL_TABLET | ORAL | 0 refills | Status: DC | PRN

## 2022-11-08 MED ORDER — DOCUSATE SODIUM 100 MG PO CAPS: 100.0000 mg | ORAL_CAPSULE | Freq: Two times a day (BID) | ORAL | 0 refills | Status: AC

## 2022-11-08 MED ORDER — NAPROXEN 500 MG PO TABS: 500.0000 mg | ORAL_TABLET | Freq: Two times a day (BID) | ORAL | 0 refills | Status: AC

## 2022-11-08 MED ORDER — ONDANSETRON HCL 8 MG PO TABS: 8.0000 mg | ORAL_TABLET | Freq: Three times a day (TID) | ORAL | 0 refills | Status: DC | PRN

## 2022-11-08 MED ORDER — NAPROXEN 500 MG PO TABS: 500.0000 mg | ORAL_TABLET | Freq: Two times a day (BID) | ORAL | 0 refills | Status: DC

## 2022-11-08 MED ORDER — DOCUSATE SODIUM 100 MG PO CAPS: 100.0000 mg | ORAL_CAPSULE | Freq: Two times a day (BID) | ORAL | 0 refills | Status: DC

## 2022-11-08 NOTE — Telephone Encounter (Signed)
Pt. Is to take Naproxen 500 mg twice a day for 7 days. Colace 100 mg twice a day for 7 days. Percocet 7.5/325mg every 4 hours for 5 days for pain. Zofran 8mg every 8 hours for three days and then as needed for nausea/vomiting. Phenergan 12.5 mg every four hours as needed for nausea/vomiting.  

## 2022-11-09 ENCOUNTER — Other Ambulatory Visit: Payer: Self-pay | Admitting: Interventional Radiology

## 2022-11-09 ENCOUNTER — Ambulatory Visit
Admission: RE | Admit: 2022-11-09 | Discharge: 2022-11-09 | Disposition: A | Discharge status: Home / Self Care | Payer: PRIVATE HEALTH INSURANCE | Source: Ambulatory Visit | Attending: Interventional Radiology | Admitting: Interventional Radiology

## 2022-11-09 ENCOUNTER — Encounter: Payer: Self-pay | Admitting: Family

## 2022-11-09 DIAGNOSIS — D219 Benign neoplasm of connective and other soft tissue, unspecified: Secondary | ICD-10-CM

## 2022-11-09 DIAGNOSIS — D251 Intramural leiomyoma of uterus: Secondary | ICD-10-CM

## 2022-11-09 HISTORY — PX: IR FLUORO GUIDED NEEDLE PLC ASPIRATION/INJECTION LOC: IMG2395

## 2022-11-09 HISTORY — PX: IR EMBO TUMOR ORGAN ISCHEMIA INFARCT INC GUIDE ROADMAPPING: IMG5449

## 2022-11-09 MED ORDER — MIDAZOLAM HCL 2 MG/2ML IJ SOLN
INTRAMUSCULAR | Status: AC | PRN
  Administered 2022-11-09: .5 mg via INTRAVENOUS
  Administered 2022-11-09: 1 mg via INTRAVENOUS
  Administered 2022-11-09: .5 mg via INTRAVENOUS

## 2022-11-09 MED ORDER — SODIUM CHLORIDE 0.9 % IV SOLN: INTRAVENOUS | Status: DC

## 2022-11-09 MED ORDER — KETOROLAC TROMETHAMINE 30 MG/ML IJ SOLN
30.0000 mg | Freq: Once | INTRAMUSCULAR | Status: AC
  Administered 2022-11-09: 30 mg via INTRAMUSCULAR

## 2022-11-09 MED ORDER — CEFAZOLIN SODIUM-DEXTROSE 2-4 GM/100ML-% IV SOLN
2.0000 g | INTRAVENOUS | Status: AC
  Administered 2022-11-09: 2 g via INTRAVENOUS

## 2022-11-09 MED ORDER — ACETAMINOPHEN 10 MG/ML IV SOLN
1000.0000 mg | Freq: Once | INTRAVENOUS | Status: AC
  Administered 2022-11-09: 1000 mg via INTRAVENOUS

## 2022-11-09 MED ORDER — MIDAZOLAM HCL 2 MG/2ML IJ SOLN: 1.0000 mg | INTRAMUSCULAR | Status: DC | PRN

## 2022-11-09 MED ORDER — SODIUM CHLORIDE 0.9 % IV SOLN
10.0000 mg | Freq: Once | INTRAVENOUS | Status: AC
  Administered 2022-11-09: 10 mg via INTRAVENOUS

## 2022-11-09 MED ORDER — FENTANYL CITRATE PF 50 MCG/ML IJ SOSY: 25.0000 ug | PREFILLED_SYRINGE | INTRAMUSCULAR | Status: DC | PRN

## 2022-11-09 MED ORDER — IOPAMIDOL (ISOVUE-300) INJECTION 61%
92.0000 mL | Freq: Once | INTRAVENOUS | Status: AC | PRN
  Administered 2022-11-09: 92 mL via INTRA_ARTERIAL

## 2022-11-09 MED ORDER — HYDROMORPHONE HCL 1 MG/ML IJ SOLN
1.0000 mg | Freq: Once | INTRAMUSCULAR | Status: AC
  Administered 2022-11-09: 1 mg via INTRAVENOUS

## 2022-11-09 MED ORDER — PANTOPRAZOLE SODIUM 40 MG IV SOLR
40.0000 mg | Freq: Once | INTRAVENOUS | Status: AC
  Administered 2022-11-09: 40 mg via INTRAVENOUS

## 2022-11-09 MED ORDER — FENTANYL CITRATE (PF) 100 MCG/2ML IJ SOLN
INTRAMUSCULAR | Status: AC | PRN
  Administered 2022-11-09: 50 ug via INTRAVENOUS
  Administered 2022-11-09: 25 ug via INTRAVENOUS
  Administered 2022-11-09: 50 ug via INTRAVENOUS
  Administered 2022-11-09: 25 ug via INTRAVENOUS

## 2022-11-09 MED ORDER — HYDROMORPHONE HCL 1 MG/ML IJ SOLN
1.0000 mg | Freq: Once | INTRAMUSCULAR | Status: AC
  Administered 2022-11-09: 0.5 mg via INTRAVENOUS

## 2022-11-09 MED ORDER — ONDANSETRON HCL 4 MG/2ML IJ SOLN
8.0000 mg | Freq: Once | INTRAMUSCULAR | Status: AC
  Administered 2022-11-09: 8 mg via INTRAVENOUS

## 2022-11-09 MED ORDER — MIDAZOLAM HCL 5 MG/5ML IJ SOLN
INTRAMUSCULAR | Status: AC | PRN
Start: 2022-11-09 — End: 2022-11-09
  Administered 2022-11-09: .5 mg via INTRAVENOUS

## 2022-11-09 MED ORDER — KETOROLAC TROMETHAMINE 30 MG/ML IJ SOLN
30.0000 mg | Freq: Once | INTRAMUSCULAR | Status: AC
  Administered 2022-11-09: 30 mg via INTRAVENOUS

## 2022-11-09 MED ORDER — PROMETHAZINE HCL 25 MG PO TABS
25.0000 mg | ORAL_TABLET | Freq: Once | ORAL | Status: AC
  Administered 2022-11-09: 25 mg via ORAL

## 2022-11-09 NOTE — Discharge Instructions (Signed)
Uterine Artery Embolization After Care   What can I expect after the procedure?   After the procedure, it is common to have:   Mild pain or discomfort at the arterial entry site   Uterine Cramping   Cramps can vary in strength from what you would consider to be a bad menstrual cycle all the way up to as severe as labor pains.   The cramping is typically the most severe the afternoon and evening the day of the procedure and begin to improve the next day and each day thereafter.   Vaginal bleeding. Your cycle may become irregular the first several months.   Vaginal discharge. We recommend you wear a panty liner for first 4-6 weeks following your procedure.    Nausea and vomiting.      Follow these instructions at home:   Medicines   Take your medicine exactly as told, at the same time every day. This is vital to helping you with a smooth recovery.   Zofran (ondansetron) is used to prevent nausea before it starts.  You will have a prescription to take 8 mg of this medication every 8 hours.  You should take this even if you don't feel nauseated because it is meant to prevent the nausea from occurring.  Once you get nauseated and start to vomit, you may not be able to keep your other medicines down and your pain can be left untreated.  You can take this with every other dose of the oxycodone/acetaminophen   Naproxen is a non-steroidal anti-inflammatory medicine called and is critical in keeping your inflammation and pain under control.  You must take this every 12 hrs. We recommend 8 am and 8 pm.    Percocet (oxycodone/acetaminophen) is a combination narcotic pain medication with Tylenol.  This is to help with your pain.  Take it every 4 hours regardless of your pain level the first 2 days.  Set an alarm to wake up so you don't miss a dose overnight and get behind on your pain control.  After the first 48 hrs, if your pain is minimal you can take only as needed.    Colace (docusate  sodium) is a stool softener to help prevent constipation.  The pain medications often cause constipation which can be particularly uncomfortable after [place].  We recommend you take this at 8 am and 8 pm with your naproxen.    Phenergan (promethazine) is another medication for nausea.  If you still feel sick to your stomach or vomit despite the Zofran (ondansetron) take this medicine every 8 hours as needed.    Incision care   Leave your bandage on for 24 hrs and keep the area dry   You may remove the bandage after 24 hrs and then shower as normal.    Do not submerge in a bath, pool or hot tub until the small incision is completely healed (5-7 days).   Activity   Do not lift anything that is heavier than 5 lb (2.3 kg)for the first 3 days.     Return to your normal activities after day 5.  Take it slowly and listen to your body. Ask your health care provider what activities are safe for you.   General instructions   Many women find a hot water bottle or heating pad helpful when placed on the lower abdomen.  This is fine to do if it helps your cramps.    Do not use any products that contain nicotine or tobacco.   These products include cigarettes, chewing tobacco, and vaping devices, such as e-cigarettes. These can delay incision healing. If you need help quitting, ask your health care provider.   Do not have sex or put anything in your vagina for at least 14 days.    Do not drink alcohol until your health care provider says it is okay.   Keep all follow-up visits. This is important.      Please contact our office at [phone] for the following symptoms:   You have a fever.   You have more redness, swelling, or pain around your incision.   You have more fluid or blood coming from your incision site.   Your incision feels warm to the touch.   You have pus or a bad smell coming from your incision or vagina.   You have a rash.   You have nausea, or you cannot eat or drink  anything without vomiting.   You have a vaginal discharge that is not getting lighter.   Get help right away if:   You have trouble breathing.   You have chest pain.   You have severe pain in your abdomen, and it does not get better with medicine.   You have leg pain or leg swelling.   You feel dizzy, or you faint.   Do not wait to see if the symptoms will go away.   Do not drive yourself to the hospital.      These symptoms may be an emergency.    Get help right away. Call 911.   Summary   After the procedure, it is common to have cramps, or pain or discomfort at the incision site. You will be given pain medicine.   Follow instructions from your health care provider about how to take care of your incision. Check your incision area every day for signs of infection.   Take over-the-counter and prescription medicines only as told by your health care provider.   Contact your health care provider if you have symptoms of infection or other symptoms that do not get better with treatment.   Thanks for visiting [facility] ! 

## 2022-11-15 ENCOUNTER — Encounter: Payer: Self-pay | Admitting: Family Medicine

## 2022-11-15 ENCOUNTER — Other Ambulatory Visit: Payer: Self-pay

## 2022-11-15 ENCOUNTER — Other Ambulatory Visit: Payer: Self-pay | Admitting: Interventional Radiology

## 2022-11-15 ENCOUNTER — Telehealth: Payer: Self-pay

## 2022-11-15 DIAGNOSIS — I10 Essential (primary) hypertension: Secondary | ICD-10-CM

## 2022-11-15 DIAGNOSIS — D259 Leiomyoma of uterus, unspecified: Secondary | ICD-10-CM

## 2022-11-15 MED ORDER — LOSARTAN POTASSIUM 50 MG PO TABS
50.0000 mg | ORAL_TABLET | Freq: Every day | ORAL | 0 refills | Status: DC
Start: 2022-11-15 — End: 2023-02-12

## 2022-11-15 NOTE — Telephone Encounter (Signed)
Rx sent in

## 2022-11-15 NOTE — Telephone Encounter (Signed)
Phone call to pt to follow up from her [place] on 11/09/22. Pt. Reports that she got slightly constipated but was able to get relief from a laxative. She is no longer taking her prescribed pain medication and is having no pain. Pt. Reports light vaginal spotting, RN informed pt. That can be very normal after her procedure.Pt. Denies any signs of bleeding, infection, redness at the right femoral site, drainage at the site, fever, nausea, or vomiting. Pt has no complaints at this time. Dr. Archer Asa will call her within the week to check on her status post-procedure. Pt advised to call back if anything were to change or any concerns arise and we will arrange an in person appointment. Pt verbalized understanding.

## 2022-11-23 ENCOUNTER — Ambulatory Visit
Admission: RE | Admit: 2022-11-23 | Discharge: 2022-11-23 | Disposition: A | Discharge status: Home / Self Care | Payer: PRIVATE HEALTH INSURANCE | Source: Ambulatory Visit | Attending: Interventional Radiology | Admitting: Interventional Radiology

## 2022-11-23 ENCOUNTER — Encounter: Payer: Self-pay | Admitting: Family

## 2022-11-23 DIAGNOSIS — D259 Leiomyoma of uterus, unspecified: Secondary | ICD-10-CM

## 2022-11-23 HISTORY — PX: IR RADIOLOGIST EVAL & MGMT: IMG5224

## 2022-11-23 NOTE — Progress Notes (Signed)
Chief Complaint: Patient was consulted remotely today (TeleHealth) for Uterine fibroids status post uterine artery embolization at the request of Stacy Miles.    Referring Physician(s): Miller,Mary S   History of Present Illness: Stacy Miles is a 47 y.o. female with a history of symptomatic uterine fibroids.  She underwent bilateral uterine artery embolization on 11/09/2022.  We spoke over the phone today for her 2-week follow-up evaluation.  She returned back to work Monday and reports resuming full activities.  She has some occasional mild fatigue and intermittent spotting but otherwise has no significant pain, fever, chills, decreased appetite or other complicating symptoms.  Thus far, she is pleased with her progress.  Past Medical History:  Diagnosis Date   Anemia    Depression    Dysmenorrhea    Fibroid    History of blood transfusion 09/2016   [facility] - 2 units transfused   Hypertension    Insomnia    Iron deficiency anemia    Stress     Past Surgical History:  Procedure Laterality Date   DILATATION & CURETTAGE/HYSTEROSCOPY WITH MYOSURE N/A 11/13/2016   Procedure: DILATATION & CURETTAGE/HYSTEROSCOPY WITH MYOSURE;  Surgeon: Romualdo Bolk, MD;  Location: [facility];  Service: Gynecology;  Laterality: N/A;   DILATION AND CURETTAGE OF UTERUS     IR EMBO TUMOR ORGAN ISCHEMIA INFARCT INC GUIDE ROADMAPPING  11/09/2022   IR FLUORO GUIDED NEEDLE PLC ASPIRATION/INJECTION LOC  11/09/2022   IR RADIOLOGIST EVAL & MGMT  10/10/2022   IR RADIOLOGIST EVAL & MGMT  11/23/2022   OTHER SURGICAL HISTORY     cervical polyp removal     Allergies: Patient has no known allergies.  Medications: Prior to Admission medications   Medication Sig Start Date End Date Taking? Authorizing Provider  Cholecalciferol 1.25 MG (50000 UT) capsule Take 1 capsule (50,000 Units total) by mouth once a week. d3 01/05/22   McLean-Scocuzza, Pasty Spillers, MD  ibuprofen (ADVIL) 800 MG tablet Take 1 tablet (800 mg  total) by mouth every 8 (eight) hours as needed. 12/15/21   Romualdo Bolk, MD  Iron, Ferrous Sulfate, 325 (65 Fe) MG TABS Take 325 mg by mouth every other day. 07/10/22   Dana Allan, MD  losartan (COZAAR) 50 MG tablet Take 1 tablet (50 mg total) by mouth daily. 11/15/22   Dana Allan, MD  norethindrone (MICRONOR) 0.35 MG tablet Take 1 tablet (0.35 mg total) by mouth daily. 07/27/22   Jerene Bears, MD  ondansetron (ZOFRAN) 8 MG tablet Take 1 tablet (8 mg total) by mouth every 8 (eight) hours as needed for nausea or vomiting. 11/08/22   Sterling Big, MD  promethazine (PHENERGAN) 12.5 MG tablet Take 1 tablet (12.5 mg total) by mouth every 4 (four) hours as needed for nausea or vomiting. 11/08/22   Sterling Big, MD     Family History  Problem Relation Age of Onset   Diabetes Mother    Hypertension Mother    Arthritis Mother    Asthma Mother    COPD Father    Breast cancer Maternal Grandmother        mat great gm    Social History   Socioeconomic History   Marital status: Single    Spouse name: Not on file   Number of children: Not on file   Years of education: Not on file   Highest education level: Not on file  Occupational History   Not on file  Tobacco Use   Smoking  status: Never   Smokeless tobacco: Never  Vaping Use   Vaping status: Never Used  Substance and Sexual Activity   Alcohol use: No   Drug use: No   Sexual activity: Not Currently    Birth control/protection: Pill  Other Topics Concern   Not on file  Social History Narrative   In school for criminal justice   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  All other systems are negative.    Physical Exam No direct physical exam was performed (except for noted visual exam findings with Video Visits).    Vital Signs: There were no vitals taken for this visit.  Imaging: IR Radiologist Eval & Mgmt  Result Date: 11/23/2022 EXAM: ESTABLISHED PATIENT OFFICE VISIT CHIEF COMPLAINT: SEE EPIC NOTE HISTORY OF PRESENT ILLNESS: SEE EPIC NOTE REVIEW OF SYSTEMS: SEE EPIC NOTE PHYSICAL EXAMINATION: SEE EPIC NOTE ASSESSMENT AND PLAN: SEE EPIC NOTE Electronically Signed   By: Malachy Moan M.D.   On: 11/23/2022 08:49   IR EMBO TUMOR ORGAN ISCHEMIA INFARCT INC GUIDE ROADMAPPING  Result Date: 11/09/2022 INDICATION: Symptomatic uterine fibroids. EXAM: IR EMBO TUMOR ORGAN ISCHEMIA INFARCT INC GUIDE ROADMAPPING; IR FLUORO GUIDE NEEDLE PLACEMENT /BIOPSY 1.)  Superior hypogastric nerve block with fluoro guidance 2.)  Bilateral [place] MEDICATIONS: PRE PROCEDURE: 1000 mg Tylenol IV, 10 mg Decadron IV, 2 g Ancef IV, Protonix 40 mg IV, Zofran 8 mg IV administered by radiology nursing under my supervision. INTRA PROCEDURE: Toradol 30 mg IV, Toradol 30 mg IM administered by radiology nursing under my supervision. POST PROCEDURE: Dilaudid 1.5 Mg IV and Phenergan 25 mg p.o. administered by radiology nursing under my supervision. ANESTHESIA/SEDATION: Moderate (conscious) sedation was employed during this procedure. A total of Versed 2.5 mg and Fentanyl 150 mcg was administered intravenously. Moderate Sedation Time: 75 minutes. The patient's level of consciousness and vital signs were monitored continuously by radiology nursing throughout the procedure under my direct supervision. CONTRAST:  80 mL Omnipaque 300 FLUOROSCOPY: Radiation Exposure Index (as provided by the  fluoroscopic device): 401.9 mGy Kerma COMPLICATIONS: None immediate. PROCEDURE: Informed consent was obtained from the patient following explanation of the procedure, risks, benefits and alternatives. The patient understands, agrees and consents for the procedure. All questions were addressed. A time out was performed prior to the initiation of the procedure. Maximal barrier sterile technique utilized including caps, mask, sterile gowns, sterile gloves, large sterile drape, hand hygiene, and Betadine prep. The right common femoral artery was interrogated with ultrasound and found to be widely patent. An image was obtained and stored for the medical record. Local anesthesia was attained by infiltration with 1% lidocaine. A small dermatotomy was made. Under real-time sonographic guidance, the vessel was punctured with a 21 gauge micropuncture needle. Using standard technique, the initial micro needle was exchanged  over a 0.018 micro wire for a transitional 4 [place] micro sheath. The micro sheath was then exchanged over a 0.035 wire for a 5 French vascular sheath. A C2 cobra catheter was advanced over a Bentson wire up in over the aortic bifurcation and into the left internal iliac artery. Attention was then turned to the superior hypogastric block portion of the procedure. The image intensifier was angulated until the L5 vertebral body could be viewed en face. A suitable skin entry site targeting the central aspect of the L5 vertebral body was identified and marked. Local anesthesia was attained by infiltration with 1% lidocaine. Under intermittent fluoroscopic guidance, a 20 cm 22 gauge Chiba needle was carefully advanced onto the anterior surface of the L5 vertebral body. This was confirmed in the lateral projection. A gentle injection of contrast material was performed in both the lateral and frontal projections confirming that the needle tip is within the prevertebral soft tissues and there is no vascular uptake.  20 mL of 0.5% ropivacaine was then slowly injected. The needle was then removed. Attention was turned back to portion procedure. A left internal iliac arteriogram was performed. The origin of the left uterine artery was identified. A renegade hiFlo microcatheter was advanced over a Fathom 16 wire into the horizontal segment of the left uterine artery. Arteriography was performed confirming opacification of the intramural uterine branches. No evidence of cervical vaginal branch. Particle embolization was performed utilizing 1 vial of 500 micron Embozene and 1 vial of 700 micron Embozene. Postembolization arteriography confirms near stasis. The microcatheter was removed. The C2 cobra catheter was formed into a Waltman's loop and used to select the ipsilateral right internal iliac artery. Contrast injection was performed with digital subtraction angiography and the origin of the right uterine artery was identified. The high-flow microcatheter was advanced over the Fathom 16 wire into The horizontal segment of the right uterine artery. Arteriography was performed confirming catheter placement and absence of cervicalvaginal branches distal to the catheter tip. Particle embolization was performed utilizing 1 vial of 500 micron Embozene. Follow-up contrast injection confirms near stasis. The Waltman's loop was un formed and the catheter removed over a Bentson wire. Hemostasis was then attained with the assistance of a Celt arterial closure device (ACD). IMPRESSION: 1. Technically successful superior hypogastric nerve block. 2. Technically successful bilateral uterine artery embolization. Electronically Signed   By: Malachy Moan M.D.   On: 11/09/2022 11:28   IR Fluoro Guide Ndl Plmt / BX  Result Date: 11/09/2022 INDICATION: Symptomatic uterine fibroids. EXAM: IR EMBO TUMOR ORGAN ISCHEMIA INFARCT INC GUIDE ROADMAPPING; IR FLUORO GUIDE NEEDLE PLACEMENT /BIOPSY 1.)  Superior hypogastric nerve block with fluoro  guidance 2.)  Bilateral [place] MEDICATIONS: PRE PROCEDURE: 1000 mg Tylenol IV, 10 mg Decadron IV, 2 g Ancef IV, Protonix 40 mg IV, Zofran 8 mg IV administered by radiology nursing under my supervision. INTRA PROCEDURE: Toradol 30 mg IV, Toradol 30 mg IM administered by radiology nursing under my supervision. POST PROCEDURE: Dilaudid 1.5 Mg IV and Phenergan 25 mg p.o. administered by radiology nursing under my supervision. ANESTHESIA/SEDATION: Moderate (conscious) sedation was employed during this procedure. A total of Versed 2.5 mg and Fentanyl 150 mcg was administered intravenously. Moderate Sedation Time: 75 minutes. The patient's level of consciousness and vital signs were monitored continuously by radiology nursing throughout the procedure under my direct supervision. CONTRAST:  80 mL Omnipaque 300 FLUOROSCOPY: Radiation Exposure Index (as provided by the fluoroscopic device): 401.9 mGy Kerma COMPLICATIONS: None immediate. PROCEDURE: Informed  consent was obtained from the patient following explanation of the procedure, risks, benefits and alternatives. The patient understands, agrees and consents for the procedure. All questions were addressed. A time out was performed prior to the initiation of the procedure. Maximal barrier sterile technique utilized including caps, mask, sterile gowns, sterile gloves, large sterile drape, hand hygiene, and Betadine prep. The right common femoral artery was interrogated with ultrasound and found to be widely patent. An image was obtained and stored for the medical record. Local anesthesia was attained by infiltration with 1% lidocaine. A small dermatotomy was made. Under real-time sonographic guidance, the vessel was punctured with a 21 gauge micropuncture needle. Using standard technique, the initial micro needle was exchanged over a 0.018 micro wire for a transitional 4 [place] micro sheath. The micro sheath was then exchanged over a 0.035 wire for a 5 French vascular sheath. A  C2 cobra catheter was advanced over a Bentson wire up in over the aortic bifurcation and into the left internal iliac artery. Attention was then turned to the superior hypogastric block portion of the procedure. The image intensifier was angulated until the L5 vertebral body could be viewed en face. A suitable skin entry site targeting the central aspect of the L5 vertebral body was identified and marked. Local anesthesia was attained by infiltration with 1% lidocaine. Under intermittent fluoroscopic guidance, a 20 cm 22 gauge Chiba needle was carefully advanced onto the anterior surface of the L5 vertebral body. This was confirmed in the lateral projection. A gentle injection of contrast material was performed in both the lateral and frontal projections confirming that the needle tip is within the prevertebral soft tissues and there is no vascular uptake. 20 mL of 0.5% ropivacaine was then slowly injected. The needle was then removed. Attention was turned back to portion procedure. A left internal iliac arteriogram was performed. The origin of the left uterine artery was identified. A renegade hiFlo microcatheter was advanced over a Fathom 16 wire into the horizontal segment of the left uterine artery. Arteriography was performed confirming opacification of the intramural uterine branches. No evidence of cervical vaginal branch. Particle embolization was performed utilizing 1 vial of 500 micron Embozene and 1 vial of 700 micron Embozene. Postembolization arteriography confirms near stasis. The microcatheter was removed. The C2 cobra catheter was formed into a Waltman's loop and used to select the ipsilateral right internal iliac artery. Contrast injection was performed with digital subtraction angiography and the origin of the right uterine artery was identified. The high-flow microcatheter was advanced over the Fathom 16 wire into The horizontal segment of the right uterine artery. Arteriography was performed  confirming catheter placement and absence of cervicalvaginal branches distal to the catheter tip. Particle embolization was performed utilizing 1 vial of 500 micron Embozene. Follow-up contrast injection confirms near stasis. The Waltman's loop was un formed and the catheter removed over a Bentson wire. Hemostasis was then attained with the assistance of a Celt arterial closure device (ACD). IMPRESSION: 1. Technically successful superior hypogastric nerve block. 2. Technically successful bilateral uterine artery embolization. Electronically Signed   By: Malachy Moan M.D.   On: 11/09/2022 11:28    Labs:  CBC: Recent Labs    12/15/21 1140 01/05/22 0808 07/06/22 1040  WBC 4.7 4.5 4.2  HGB 13.3 13.4 11.7*  HCT 38.6 40.4 35.6*  PLT 280 309.0 324.0    COAGS: No results for input(s): "INR", "APTT" in the last 8760 hours.  BMP: Recent Labs    01/05/22 0808  07/06/22 1040 07/06/22 1348  NA 135 138 141  Miles 4.3 4.2 4.4  CL 105 109 109*  CO2 23 24 20   GLUCOSE 89 147* 133*  BUN 13 9 7   CALCIUM 10.7* 10.7* 11.0*  CREATININE 0.79 0.66 0.64    LIVER FUNCTION TESTS: Recent Labs    01/05/22 0808 07/06/22 1040 07/06/22 1348  BILITOT 0.4 0.2 <0.2  AST 19 14 16   ALT 17 13 15   ALKPHOS 203* 240* 267*  PROT 7.1 6.4 6.4  ALBUMIN 4.0 3.9 3.9    TUMOR MARKERS: No results for input(s): "AFPTM", "CEA", "CA199", "CHROMGRNA" in the last 8760 hours.  Assessment and Plan:  47 year old female 2 weeks status post bilateral uterine artery embolization.  She has returned to work and is at full activities.  Recovery is proceeding as expected.  No complications.  1.)  Next clinical visit in 3 months.  Electronically Signed: Sterling Big 11/23/2022, 12:28 PM   I spent a total of 15 Minutes in remote  clinical consultation, greater than 50% of which was counseling/coordinating care for uterine fibroids s/p uterine artery embolization.    Visit type: Audio only (telephone). Audio (no  video) only due to patient preference. Alternative for in-person consultation at [facility], [address] [place], [place], [place]. This visit type was conducted due to national recommendations for restrictions regarding the COVID-19 Pandemic (e.g. social distancing).  This format is felt to be most appropriate for this patient at this time.  All issues noted in this document were discussed and addressed.

## 2022-12-14 ENCOUNTER — Other Ambulatory Visit: Payer: Self-pay

## 2022-12-14 ENCOUNTER — Ambulatory Visit (INDEPENDENT_AMBULATORY_CARE_PROVIDER_SITE_OTHER): Payer: PRIVATE HEALTH INSURANCE | Admitting: Gastroenterology

## 2022-12-14 ENCOUNTER — Encounter: Payer: Self-pay | Admitting: Gastroenterology

## 2022-12-14 VITALS — BP 130/80 | HR 83 | Temp 98.7°F | Ht 66.0 in | Wt 245.6 lb

## 2022-12-14 DIAGNOSIS — R932 Abnormal findings on diagnostic imaging of liver and biliary tract: Secondary | ICD-10-CM

## 2022-12-14 NOTE — Progress Notes (Signed)
Stacy Mood MD, MRCP(U.K) [address]  [address]  [place], [place] [address]  Main: [phone]  Fax: [phone]   Primary Care Physician: Stacy Allan, MD  Primary Gastroenterologist:  Dr. Wyline Miles   Chief Complaint  Patient presents with   Follow-up    Abnormal LFT'S    HPI: Stacy Miles is a 47 y.o. female   Summary of history :   Initially referred and seen for abnormal liver lesion seen on imaging.  Back in October 2023 patient was noted to have elevated alkaline phosphatase and imaging was obtained of the liver that showed ill-defined hypoechoic mass in the right hepatic lobe measuring 2.6 cm subsequently MRI of the abdomen was obtained in November 2023 that showed multiple lesions in the right hepatic lobe with assessment limited due to background steatosis.  Repeat MRI without and with Eovist contrast in 3 to 6 months was recommended.   October 2023 alkaline phosphatase 203 otherwise LFTs normal since then not been repeated.     She denies any past liver disease.  No family history of liver disease.  No illegal drug use.  No excess alcohol consumption no Financial planner.  She has a tattoo which is professionally performed.  No other symptoms.  Blood transfusion in 2018.  No travel outside the country.  Denies any abdominal pain.  This is the heaviest she has weighed.  She does use birth control pills.   Interval history 07/06/2022-12/14/2022  07/24/2022 MRI of the liver with and without contrast showed mild fatty infiltration of the liver stable bright right hepatic lobe lesion most consistent with benign hemorrhagic or proteinaceous cyst recommend follow-up in November 2024.  07/06/2022: Alkaline phosphatase 267, GGT 16 She is doing well no complaints. Current Outpatient Medications  Medication Sig Dispense Refill   Cholecalciferol 1.25 MG (50000 UT) capsule Take 1 capsule (50,000 Units total) by mouth once a week. d3 13 capsule 1   ibuprofen (ADVIL) 800 MG  tablet Take 1 tablet (800 mg total) by mouth every 8 (eight) hours as needed. 30 tablet 1   Iron, Ferrous Sulfate, 325 (65 Fe) MG TABS Take 325 mg by mouth every other day. 90 tablet 3   losartan (COZAAR) 50 MG tablet Take 1 tablet (50 mg total) by mouth daily. 90 tablet 0   norethindrone (MICRONOR) 0.35 MG tablet Take 1 tablet (0.35 mg total) by mouth daily. 84 tablet 3   ondansetron (ZOFRAN) 8 MG tablet Take 1 tablet (8 mg total) by mouth every 8 (eight) hours as needed for nausea or vomiting. 30 tablet 0   oxyCODONE-acetaminophen (PERCOCET/ROXICET) 5-325 MG tablet Take 1 tablet by mouth every 8 (eight) hours as needed for moderate pain.     promethazine (PHENERGAN) 12.5 MG tablet Take 1 tablet (12.5 mg total) by mouth every 4 (four) hours as needed for nausea or vomiting. 30 tablet 0   No current facility-administered medications for this visit.    Allergies as of 12/14/2022   (No Known Allergies)       ROS:  General: Negative for anorexia, weight loss, fever, chills, fatigue, weakness. ENT: Negative for hoarseness, difficulty swallowing , nasal congestion. CV: Negative for chest pain, angina, palpitations, dyspnea on exertion, peripheral edema.  Respiratory: Negative for dyspnea at rest, dyspnea on exertion, cough, sputum, wheezing.  GI: See history of present illness. GU:  Negative for dysuria, hematuria, urinary incontinence, urinary frequency, nocturnal urination.  Endo: Negative for unusual weight change.    Physical Examination:  BP 130/80 (BP Location: Right Arm, Patient Position: Sitting, Cuff Size: Large)   Pulse 83   Temp 98.7 F (37.1 C) (Oral)   Ht 5\' 6"  (1.676 m)   Wt 245 lb 9.6 oz (111.4 kg)   BMI 39.64 kg/m   General: Well-nourished, well-developed in no acute distress.  Eyes: No icterus. Conjunctivae pink. Neuro: Alert and oriented x 3.  Grossly intact. Skin: Warm and dry, no jaundice.   Psych: Alert and cooperative, normal Miles and affect.   Imaging  Studies: IR Radiologist Eval & Mgmt  Result Date: 11/23/2022 EXAM: ESTABLISHED PATIENT OFFICE VISIT CHIEF COMPLAINT: SEE EPIC NOTE HISTORY OF PRESENT ILLNESS: SEE EPIC NOTE REVIEW OF SYSTEMS: SEE EPIC NOTE PHYSICAL EXAMINATION: SEE EPIC NOTE ASSESSMENT AND PLAN: SEE EPIC NOTE Electronically Signed   By: Malachy Moan M.D.   On: 11/23/2022 08:49    Assessment and Plan:   Stacy Miles is a 47 y.o. y/o female for normal liver lesion incidentally found on ultrasound and MRI as part of process to workup an isolated alkaline phosphatase elevation back in October 2023.  MRI showed that the lesions in the liver are most likely benign and recommended repeat in November 2024.  The, GGT was not elevated indicating that the elevated alkaline phosphatase is not from the liver.   Plan 1.  MRI liver in November 2024    Dr Stacy Mood  MD,MRCP [facility]) Follow up Stacy Miles in 6 months

## 2022-12-14 NOTE — Patient Instructions (Signed)
Follow up video visit December/January with Dr. Tobi Bastos Schedule Repeat MRI in November (order has been placed).  Call to schedule.  Thank you for allowing [place] to serve your health care needs today.  [facility]

## 2022-12-21 ENCOUNTER — Ambulatory Visit: Payer: No Typology Code available for payment source | Admitting: Obstetrics and Gynecology

## 2022-12-28 ENCOUNTER — Ambulatory Visit: Payer: PRIVATE HEALTH INSURANCE | Admitting: Family Medicine

## 2022-12-28 ENCOUNTER — Encounter: Payer: Self-pay | Admitting: Family Medicine

## 2022-12-28 VITALS — BP 122/62 | HR 74 | Temp 98.0°F | Resp 16 | Ht 66.0 in | Wt 246.0 lb

## 2022-12-28 DIAGNOSIS — K769 Liver disease, unspecified: Secondary | ICD-10-CM

## 2022-12-28 DIAGNOSIS — I10 Essential (primary) hypertension: Secondary | ICD-10-CM

## 2022-12-28 DIAGNOSIS — E559 Vitamin D deficiency, unspecified: Secondary | ICD-10-CM

## 2022-12-28 DIAGNOSIS — D509 Iron deficiency anemia, unspecified: Secondary | ICD-10-CM

## 2022-12-28 DIAGNOSIS — Z6841 Body Mass Index (BMI) 40.0 and over, adult: Secondary | ICD-10-CM

## 2022-12-28 DIAGNOSIS — Z1211 Encounter for screening for malignant neoplasm of colon: Secondary | ICD-10-CM

## 2022-12-28 LAB — BASIC METABOLIC PANEL
BUN: 11 mg/dL (ref 6–23)
CO2: 24 meq/L (ref 19–32)
Calcium: 11.2 mg/dL — ABNORMAL HIGH (ref 8.4–10.5)
Chloride: 109 meq/L (ref 96–112)
Creatinine, Ser: 0.68 mg/dL (ref 0.40–1.20)
GFR: 104.67 mL/min (ref >60.00)
Glucose, Bld: 108 mg/dL — ABNORMAL HIGH (ref 70–99)
Potassium: 4.1 meq/L (ref 3.5–5.1)
Sodium: 139 meq/L (ref 135–145)

## 2022-12-28 LAB — CALCIUM: Calcium: 11.2 mg/dL — ABNORMAL HIGH (ref 8.4–10.5)

## 2022-12-28 NOTE — Patient Instructions (Addendum)
It was a pleasure meeting you today. Thank you for allowing me to take part in your health care.  Our goals for today as we discussed include:  Blood pressure is great.  Goal <150/90 Continue Losartan 50 mg daily Avoid processed foods and limit sodium intake  Journal food and fluid intake daily for a few days to see what your diet is like.  Can learn then how to plan for healthier eating habits  Look at [facility] website to see if this may be an option for you with your weight loss. [facility] [address] [place] [phone]   Can also check [facility] MD  [address] [place], [place], [place] [address] [phone] [email or web address]   Follow up in 6 months  If you have any questions or concerns, please do not hesitate to call the office at [phone].  I look forward to our next visit and until then take care and stay safe.  Regards,   Dana Allan, MD   [facility]

## 2022-12-28 NOTE — Progress Notes (Signed)
SUBJECTIVE:   Chief Complaint  Patient presents with   Hypertension   HPI Patient presents to clinic for follow up hypertension  Discussed the use of AI scribe software for clinical note transcription with the patient, who gave verbal consent to proceed.  History of Present Illness The patient, on a regimen of losartan for hypertension, reports no adverse effects and denies experiencing headaches, visual disturbances, chest pain, shortness of breath, or leg swelling. They have not been monitoring their blood pressure at home. They note some blurriness in their vision, which they attribute to their job requiring them to read a screen, and plan to see an eye doctor. They also take ibuprofen as needed, iron, and vitamin D.  The patient has been under the care of a GI specialist due to elevated liver enzymes. A benign cyst was identified, and a follow-up MRI is scheduled for further evaluation. They deny any abdominal pain or discomfort.  The patient expresses concern about their weight and struggles with weight loss. They report trying to improve their diet and are considering medication to aid in weight loss. They were previously identified as being at risk for prediabetes, but their most recent A1C was 5.4, which is not indicative of prediabetes.    PERTINENT PMH / PSH: As above   OBJECTIVE:  BP 122/62   Pulse 74   Temp 98 F (36.7 C)   Resp 16   Ht 5\' 6"  (1.676 m)   Wt 246 lb (111.6 kg)   LMP 11/09/2022 (Approximate)   SpO2 99%   BMI 39.71 kg/m    Physical Exam Constitutional:      General: She is not in acute distress.    Appearance: She is normal weight. She is not ill-appearing.  HENT:     Head: Normocephalic.  Eyes:     Conjunctiva/sclera: Conjunctivae normal.  Neck:     Thyroid: No thyromegaly or thyroid tenderness.  Cardiovascular:     Rate and Rhythm: Normal rate and regular rhythm.     Pulses: Normal pulses.  Pulmonary:     Effort: Pulmonary effort is  normal.     Breath sounds: Normal breath sounds.  Abdominal:     General: Bowel sounds are normal.  Neurological:     Mental Status: She is alert. Mental status is at baseline.  Psychiatric:        Mood and Affect: Mood normal.        Behavior: Behavior normal.        Thought Content: Thought content normal.        Judgment: Judgment normal.     ASSESSMENT/PLAN:  Primary hypertension Assessment & Plan: Well controlled on Losartan with no reported side effects. Blood pressure today is 122/62. No symptoms of headache, blurry vision, chest pain, shortness of breath, or leg swelling. -Continue Losartan. -Check blood pressure daily at home, preferably in the morning. -Notify if blood pressure consistently rises above 140/90.  Orders: -     Basic metabolic panel  Colon cancer screening -     Ambulatory referral to Gastroenterology  Hypercalcemia -     Calcium -     Parathyroid hormone, intact (no Ca); Future -     Calcium, ionized; Future -     Phosphorus; Future -     CBC; Future -     Comprehensive metabolic panel; Future  Morbid obesity with BMI of 40.0-44.9, adult [facility]) Assessment & Plan: Patient expressed concern about weight and potential prediabetes. A1c was 5.4,  which is not prediabetic. Discussed lifestyle changes, including dietary modifications and keeping a food journal. Also discussed potential weight loss medications and clinics, but patient prefers to try lifestyle changes first. -Start a food journal to track daily intake. -Consider high protein foods and low glycemic index foods. -Consider nutrition consult.   Liver lesion Assessment & Plan: Patient has a benign liver cyst identified by a GI specialist. No abdominal pain reported. A follow-up MRI is scheduled for later this month. -Continue follow-up with GI specialist as planned.   Vitamin D deficiency Assessment & Plan: Patient is currently on vitamin D supplements. -Continue current  regimen.   Iron deficiency anemia, unspecified iron deficiency anemia type Assessment & Plan: Patient is currently on iron supplements. -Continue current regimen    PDMP reviewed  Return in about 6 months (around 06/28/2023) for PCP.  Dana Allan, MD

## 2023-01-05 ENCOUNTER — Encounter: Payer: Self-pay | Admitting: Family Medicine

## 2023-01-05 DIAGNOSIS — Z1211 Encounter for screening for malignant neoplasm of colon: Secondary | ICD-10-CM | POA: Insufficient documentation

## 2023-01-05 NOTE — Assessment & Plan Note (Signed)
Patient has a benign liver cyst identified by a GI specialist. No abdominal pain reported. A follow-up MRI is scheduled for later this month. -Continue follow-up with GI specialist as planned.

## 2023-01-05 NOTE — Assessment & Plan Note (Signed)
Well controlled on Losartan with no reported side effects. Blood pressure today is 122/62. No symptoms of headache, blurry vision, chest pain, shortness of breath, or leg swelling. -Continue Losartan. -Check blood pressure daily at home, preferably in the morning. -Notify if blood pressure consistently rises above 140/90.

## 2023-01-05 NOTE — Assessment & Plan Note (Addendum)
Patient is currently on iron supplements. -Continue current regimen

## 2023-01-05 NOTE — Assessment & Plan Note (Signed)
Patient is currently on vitamin D supplements. -Continue current regimen.

## 2023-01-05 NOTE — Assessment & Plan Note (Signed)
Patient expressed concern about weight and potential prediabetes. A1c was 5.4, which is not prediabetic. Discussed lifestyle changes, including dietary modifications and keeping a food journal. Also discussed potential weight loss medications and clinics, but patient prefers to try lifestyle changes first. -Start a food journal to track daily intake. -Consider high protein foods and low glycemic index foods. -Consider nutrition consult.

## 2023-01-09 ENCOUNTER — Encounter: Payer: Self-pay | Admitting: *Deleted

## 2023-01-11 ENCOUNTER — Ambulatory Visit: Payer: PRIVATE HEALTH INSURANCE | Admitting: Family Medicine

## 2023-01-19 ENCOUNTER — Ambulatory Visit: Admission: RE | Admit: 2023-01-19 | Payer: PRIVATE HEALTH INSURANCE | Source: Ambulatory Visit

## 2023-01-23 ENCOUNTER — Encounter: Payer: Self-pay | Admitting: Family

## 2023-01-24 ENCOUNTER — Other Ambulatory Visit: Payer: PRIVATE HEALTH INSURANCE

## 2023-01-24 ENCOUNTER — Other Ambulatory Visit (INDEPENDENT_AMBULATORY_CARE_PROVIDER_SITE_OTHER): Payer: PRIVATE HEALTH INSURANCE

## 2023-01-24 ENCOUNTER — Ambulatory Visit
Admission: RE | Admit: 2023-01-24 | Discharge: 2023-01-24 | Disposition: A | Discharge status: Home / Self Care | Payer: PRIVATE HEALTH INSURANCE | Source: Ambulatory Visit | Attending: Gastroenterology | Admitting: Gastroenterology

## 2023-01-24 DIAGNOSIS — R932 Abnormal findings on diagnostic imaging of liver and biliary tract: Secondary | ICD-10-CM | POA: Insufficient documentation

## 2023-01-24 MED ORDER — GADOXETATE DISODIUM 0.25 MMOL/ML IV SOLN
10.0000 mL | Freq: Once | INTRAVENOUS | Status: AC | PRN
  Administered 2023-01-24: 10 mL via INTRAVENOUS

## 2023-01-25 LAB — CBC
HCT: 40.5 % (ref 36.0–46.0)
Hemoglobin: 13.4 g/dL (ref 12.0–15.0)
MCHC: 33.1 g/dL (ref 30.0–36.0)
MCV: 90.8 fL (ref 78.0–100.0)
Platelets: 239 10*3/uL (ref 150.0–400.0)
RBC: 4.46 Mil/uL (ref 3.87–5.11)
RDW: 13.6 % (ref 11.5–15.5)
WBC: 3.5 10*3/uL — ABNORMAL LOW (ref 4.0–10.5)

## 2023-01-25 LAB — COMPREHENSIVE METABOLIC PANEL
ALT: 23 U/L (ref 0–35)
AST: 22 U/L (ref 0–37)
Albumin: 4 g/dL (ref 3.5–5.2)
Alkaline Phosphatase: 253 U/L — ABNORMAL HIGH (ref 39–117)
BUN: 13 mg/dL (ref 6–23)
CO2: 27 meq/L (ref 19–32)
Calcium: 11.9 mg/dL — ABNORMAL HIGH (ref 8.4–10.5)
Chloride: 109 meq/L (ref 96–112)
Creatinine, Ser: 0.77 mg/dL (ref 0.40–1.20)
GFR: 92.66 mL/min (ref >60.00)
Glucose, Bld: 81 mg/dL (ref 70–99)
Potassium: 4.1 meq/L (ref 3.5–5.1)
Sodium: 144 meq/L (ref 135–145)
Total Bilirubin: 0.3 mg/dL (ref 0.2–1.2)
Total Protein: 7.2 g/dL (ref 6.0–8.3)

## 2023-01-25 LAB — PARATHYROID HORMONE, INTACT (NO CA): PTH: 140 pg/mL — ABNORMAL HIGH (ref 16–77)

## 2023-01-25 LAB — CALCIUM, IONIZED: Calcium, Ion: 6.3 mg/dL — ABNORMAL HIGH (ref 4.7–5.5)

## 2023-01-25 LAB — PHOSPHORUS: Phosphorus: 3.1 mg/dL (ref 2.3–4.6)

## 2023-01-29 ENCOUNTER — Other Ambulatory Visit: Payer: Self-pay | Admitting: Family Medicine

## 2023-01-31 ENCOUNTER — Ambulatory Visit: Payer: PRIVATE HEALTH INSURANCE | Admitting: Family Medicine

## 2023-01-31 ENCOUNTER — Telehealth: Payer: Self-pay

## 2023-01-31 NOTE — Telephone Encounter (Signed)
Gastroenterology Pre-Procedure Review Patient would like to be scheduled next year. She will call back to schedule.  Request Date: TBD Requesting Physician: Dr. Jodelle Gross  PATIENT REVIEW QUESTIONS: The patient responded to the following health history questions as indicated:    1. Are you having any GI issues? no 2. Do you have a personal history of Polyps? no 3. Do you have a family history of Colon Cancer or Polyps? no 4. Diabetes Mellitus? no 5. Joint replacements in the past 12 months?no 6. Major health problems in the past 3 months?no 7. Any artificial heart valves, MVP, or defibrillator?no    MEDICATIONS & ALLERGIES:    Patient reports the following regarding taking any anticoagulation/antiplatelet therapy:   Plavix, Coumadin, Eliquis, Xarelto, Lovenox, Pradaxa, Brilinta, or Effient? no Aspirin? no  Patient confirms/reports the following medications:  Current Outpatient Medications  Medication Sig Dispense Refill   Cholecalciferol 1.25 MG (50000 UT) capsule Take 1 capsule (50,000 Units total) by mouth once a week. d3 13 capsule 1   ibuprofen (ADVIL) 800 MG tablet Take 1 tablet (800 mg total) by mouth every 8 (eight) hours as needed. 30 tablet 1   Iron, Ferrous Sulfate, 325 (65 Fe) MG TABS Take 325 mg by mouth every other day. 90 tablet 3   losartan (COZAAR) 50 MG tablet Take 1 tablet (50 mg total) by mouth daily. 90 tablet 0   norethindrone (MICRONOR) 0.35 MG tablet Take 1 tablet (0.35 mg total) by mouth daily. 84 tablet 3   No current facility-administered medications for this visit.    Patient confirms/reports the following allergies:  No Known Allergies  No orders of the defined types were placed in this encounter.   AUTHORIZATION INFORMATION Primary Insurance: 1D#: Group #:  Secondary Insurance: 1D#: Group #:  SCHEDULE INFORMATION: Date:  Time: Location:

## 2023-02-05 ENCOUNTER — Encounter: Payer: Self-pay | Admitting: *Deleted

## 2023-02-10 ENCOUNTER — Other Ambulatory Visit: Payer: Self-pay | Admitting: Family Medicine

## 2023-02-10 DIAGNOSIS — I10 Essential (primary) hypertension: Secondary | ICD-10-CM

## 2023-02-14 ENCOUNTER — Encounter: Payer: Self-pay | Admitting: Family Medicine

## 2023-02-14 ENCOUNTER — Ambulatory Visit (INDEPENDENT_AMBULATORY_CARE_PROVIDER_SITE_OTHER): Payer: PRIVATE HEALTH INSURANCE | Admitting: Family Medicine

## 2023-02-14 DIAGNOSIS — R7989 Other specified abnormal findings of blood chemistry: Secondary | ICD-10-CM | POA: Diagnosis not present

## 2023-02-14 DIAGNOSIS — R748 Abnormal levels of other serum enzymes: Secondary | ICD-10-CM

## 2023-02-14 NOTE — Patient Instructions (Addendum)
It was a pleasure meeting you today. Thank you for allowing me to take part in your health care.  Our goals for today as we discussed include:  Will order imaging and send referral to surgery for evaluation  Continue to hydrate with water.  64 oz daily  This is a list of the screening recommended for you and due dates:  Health Maintenance  Topic Date Due   HIV Screening  Never done   Hepatitis C Screening  Never done   HPV Vaccine (2 - 3-dose SCDM series) 12/04/2019   Colon Cancer Screening  Never done   Flu Shot  06/25/2023*   Mammogram  03/10/2023   Pap with HPV screening  12/13/2025   DTaP/Tdap/Td vaccine (2 - Td or Tdap) 07/20/2027   COVID-19 Vaccine  Discontinued  *Topic was postponed. The date shown is not the original due date.      If you have any questions or concerns, please do not hesitate to call the office at [phone].  I look forward to our next visit and until then take care and stay safe.  Regards,   Dana Allan, MD   [facility]

## 2023-02-14 NOTE — Progress Notes (Signed)
SUBJECTIVE:   Chief Complaint  Patient presents with   Abnormal Lab   HPI Presents for follow up discussion of blood work  Discussed the use of AI scribe software for clinical note transcription with the patient, who gave verbal consent to proceed.  History of Present Illness The patient, with a history of liver issues and elevated calcium levels, was noted to have persistently elevated alkaline phosphatase and calcium levels. The patient was previously under the care of a gastroenterologist for liver issues. The patient's parathyroid hormone levels were also found to be elevated, raising suspicion for hyperparathyroidism. The patient reported frequent urination with a notable amount of bubbles in the urine. The patient denied experiencing any chest pain or shortness of breath. There were no complaints of leg swelling. The patient also mentioned a future plan to schedule a colonoscopy during their upcoming vacation in March.    PERTINENT PMH / PSH: As above  OBJECTIVE:  BP 134/68   Pulse 85   Temp 98 F (36.7 C)   Resp 16   Ht 5\' 6"  (1.676 m)   Wt 247 lb 4 oz (112.2 kg)   LMP 11/09/2022   SpO2 96%   BMI 39.91 kg/m    Physical Exam Vitals reviewed.  Constitutional:      General: She is not in acute distress.    Appearance: Normal appearance. She is obese. She is not ill-appearing, toxic-appearing or diaphoretic.  Eyes:     General:        Right eye: No discharge.        Left eye: No discharge.     Conjunctiva/sclera: Conjunctivae normal.  Cardiovascular:     Rate and Rhythm: Normal rate and regular rhythm.     Heart sounds: Normal heart sounds.  Pulmonary:     Effort: Pulmonary effort is normal.     Breath sounds: Normal breath sounds.  Abdominal:     General: Bowel sounds are normal.  Musculoskeletal:        General: Normal range of motion.  Skin:    General: Skin is warm and dry.  Neurological:     General: No focal deficit present.     Mental Status: She  is alert and oriented to person, place, and time. Mental status is at baseline.  Psychiatric:        Mood and Affect: Mood normal.        Behavior: Behavior normal.        Thought Content: Thought content normal.        Judgment: Judgment normal.        02/14/2023    7:54 AM 12/28/2022    8:10 AM 07/27/2022   11:00 AM 07/06/2022    9:58 AM 12/29/2021    2:13 PM  Depression screen PHQ 2/9  Decreased Interest 1 0 0 0 0  Down, Depressed, Hopeless 0 0 0 0 0  PHQ - 2 Score 1 0 0 0 0  Altered sleeping 0 0     Tired, decreased energy 1 1     Change in appetite 2 1     Feeling bad or failure about yourself  0 0     Trouble concentrating 0 0     Moving slowly or fidgety/restless 0 0     Suicidal thoughts 0 0     PHQ-9 Score 4 2     Difficult doing work/chores Somewhat difficult Not difficult at all         02/14/2023  7:54 AM 12/28/2022    8:10 AM 07/06/2022    9:58 AM 01/01/2020    2:03 PM  GAD 7 : Generalized Anxiety Score  Nervous, Anxious, on Edge 0 0 0 0  Control/stop worrying 1 1 0 0  Worry too much - different things 1 1 0 0  Trouble relaxing 0 0 0 0  Restless 0 0 0 0  Easily annoyed or irritable 0 1 0 0  Afraid - awful might happen 0 0 0 0  Total GAD 7 Score 2 3 0 0  Anxiety Difficulty Not difficult at all Not difficult at all Not difficult at all Not difficult at all    ASSESSMENT/PLAN:  Hypercalcemia Assessment & Plan: Chronically elevated calcium and Alk Pos.  Recent parathyroid hormone level elevated and ionized calcium remains elevated.  Recent TSH normal and no thyromegaly on exam.  No history of kidney stones. Symptoms include frequent urination and possible weight changes. 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 -Order ultrasound thyroid and T-99 nuclear med scan to evaluate parathyroid gland. -Dexa scan with vertebral fracture assessment ordered. -Refer to Gen Surgery,  Dr. Nat Christen for further evaluation. -Encouraged to  remain well hydrated.  Orders: -     NM Parathyroid W/Spect/CT -     US THYROID -     DG Bone Density; Future -     Ambulatory referral to General Surgery  Elevated PTHrP level -     NM Parathyroid W/Spect/CT -     US THYROID -     DG Bone Density; Future -     Ambulatory referral to General Surgery  Elevated alkaline phosphatase level Assessment & Plan: Chronically elevated.  -Evaluated by GI and deemed not associated with liver  MRI liver showed hepatic steatosis and hepatic adenomas.  Recommended repeat MRI in 6-8 months.  Due between 04-09/2023 -Continue to follow up with GI for liver adenoma -Currently undergoing evaluation for PPTH -DEXA scan with vertebral fracture assessment    General Health Maintenance -Colonoscopy planned for next vacation in March.    PDMP reviewed  Return if symptoms worsen or fail to improve, for PCP.  Dana Allan, MD

## 2023-02-16 ENCOUNTER — Telehealth: Payer: Self-pay

## 2023-02-16 NOTE — Telephone Encounter (Signed)
Called patient and left her a voicemail letting her know that I would be sending her a message through MyChart and to either reply back to me or to call me back.

## 2023-02-16 NOTE — Telephone Encounter (Signed)
-----   Message from Wyline Mood sent at 02/15/2023  1:25 PM EST ----- MRI suggests that the lesions in the liver are possibly hepatic adenomas.  They are less than 5 cm in size.  1.  Can you check with the patient if she is having any abdominal pain 2.  Can you check with the patient if she is on any hormonal contraceptives. 3.  Repeat MRI in 6 to 8 months

## 2023-02-23 ENCOUNTER — Encounter: Payer: Self-pay | Admitting: Family Medicine

## 2023-02-23 DIAGNOSIS — R748 Abnormal levels of other serum enzymes: Secondary | ICD-10-CM | POA: Insufficient documentation

## 2023-02-23 DIAGNOSIS — R7989 Other specified abnormal findings of blood chemistry: Secondary | ICD-10-CM | POA: Insufficient documentation

## 2023-02-23 NOTE — Assessment & Plan Note (Signed)
Chronically elevated calcium and Alk Pos.  Recent parathyroid hormone level elevated and ionized calcium remains elevated.  Recent TSH normal and no thyromegaly on exam.  No history of kidney stones. Symptoms include frequent urination and possible weight changes. 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 -Order ultrasound thyroid and T-99 nuclear med scan to evaluate parathyroid gland. -Dexa scan with vertebral fracture assessment ordered. -Refer to Gen Surgery,  Dr. Nat Christen for further evaluation. -Encouraged to remain well hydrated.

## 2023-02-23 NOTE — Assessment & Plan Note (Addendum)
Chronically elevated.  -Evaluated by GI and deemed not associated with liver  MRI liver showed hepatic steatosis and hepatic adenomas.  Recommended repeat MRI in 6-8 months.  Due between 04-09/2023 -Continue to follow up with GI for liver adenoma -Currently undergoing evaluation for PPTH -DEXA scan with vertebral fracture assessment

## 2023-02-26 ENCOUNTER — Telehealth: Payer: Self-pay | Admitting: Family Medicine

## 2023-02-26 NOTE — Telephone Encounter (Signed)
Lft pt vm to call ofc to sch NM scan and [place]. thanks

## 2023-02-27 ENCOUNTER — Telehealth: Payer: Self-pay | Admitting: Family Medicine

## 2023-02-27 NOTE — Telephone Encounter (Signed)
Lft pt vm to call ofc to sch [place] nad NM. thanks

## 2023-02-28 ENCOUNTER — Telehealth: Payer: Self-pay | Admitting: Family Medicine

## 2023-02-28 NOTE — Telephone Encounter (Signed)
Lft pt vm to call ofc to sch NM and US thyroid. thanks

## 2023-03-01 ENCOUNTER — Telehealth: Payer: PRIVATE HEALTH INSURANCE | Admitting: Gastroenterology

## 2023-03-05 ENCOUNTER — Telehealth: Payer: Self-pay

## 2023-03-05 NOTE — Telephone Encounter (Signed)
Cat called from [facility] requesting to speak with our Referral Coordinator.  I transferred call to [facility].

## 2023-04-25 ENCOUNTER — Telehealth: Payer: Self-pay

## 2023-04-25 NOTE — Telephone Encounter (Signed)
Copied from CRM [id number]. Topic: General - Other >> Apr 25, 2023  8:49 AM Marica Otter wrote: Reason for CRM: Lupita Leash with [facility] is calling to see if they can cancel parathyroid order for patient that was placed  back in November. Office states they have reached out to patient 6 times since December and wants to know if they can just cancel order.  Lupita Leash [phone]

## 2023-04-27 NOTE — Telephone Encounter (Signed)
 Left message to return call to our office.

## 2023-04-27 NOTE — Telephone Encounter (Signed)
Spoke to [employer] from Nuclear Medicine and she stated that she has tired 6 time to reach pt, and I advised her that the provider gave the ok to cancel the order.

## 2023-05-03 LAB — HM MAMMOGRAPHY

## 2023-06-06 ENCOUNTER — Encounter: Payer: Self-pay | Admitting: Family Medicine

## 2023-06-06 ENCOUNTER — Other Ambulatory Visit: Payer: Self-pay

## 2023-06-06 DIAGNOSIS — N6459 Other signs and symptoms in breast: Secondary | ICD-10-CM

## 2023-06-06 NOTE — Telephone Encounter (Signed)
 Left message to return call to our office.

## 2023-06-11 ENCOUNTER — Encounter: Payer: Self-pay | Admitting: Family Medicine

## 2023-06-28 ENCOUNTER — Ambulatory Visit: Payer: PRIVATE HEALTH INSURANCE | Admitting: Family Medicine

## 2023-07-05 ENCOUNTER — Ambulatory Visit (INDEPENDENT_AMBULATORY_CARE_PROVIDER_SITE_OTHER): Payer: PRIVATE HEALTH INSURANCE | Admitting: Family Medicine

## 2023-07-05 ENCOUNTER — Encounter: Payer: Self-pay | Admitting: Family Medicine

## 2023-07-05 VITALS — BP 120/72 | HR 85 | Temp 98.4°F | Resp 20 | Ht 66.0 in | Wt 246.4 lb

## 2023-07-05 DIAGNOSIS — R7309 Other abnormal glucose: Secondary | ICD-10-CM

## 2023-07-05 DIAGNOSIS — E559 Vitamin D deficiency, unspecified: Secondary | ICD-10-CM | POA: Diagnosis not present

## 2023-07-05 DIAGNOSIS — R7989 Other specified abnormal findings of blood chemistry: Secondary | ICD-10-CM | POA: Diagnosis not present

## 2023-07-05 DIAGNOSIS — Z6841 Body Mass Index (BMI) 40.0 and over, adult: Secondary | ICD-10-CM

## 2023-07-05 DIAGNOSIS — Z13220 Encounter for screening for lipoid disorders: Secondary | ICD-10-CM

## 2023-07-05 DIAGNOSIS — Z1329 Encounter for screening for other suspected endocrine disorder: Secondary | ICD-10-CM

## 2023-07-05 DIAGNOSIS — J309 Allergic rhinitis, unspecified: Secondary | ICD-10-CM

## 2023-07-05 DIAGNOSIS — D729 Disorder of white blood cells, unspecified: Secondary | ICD-10-CM

## 2023-07-05 DIAGNOSIS — I10 Essential (primary) hypertension: Secondary | ICD-10-CM

## 2023-07-05 DIAGNOSIS — E538 Deficiency of other specified B group vitamins: Secondary | ICD-10-CM

## 2023-07-05 DIAGNOSIS — K769 Liver disease, unspecified: Secondary | ICD-10-CM

## 2023-07-05 MED ORDER — AZELASTINE HCL 0.1 % NA SOLN: 2.0000 | Freq: Two times a day (BID) | NASAL | 12 refills | Status: DC

## 2023-07-05 MED ORDER — FLUTICASONE PROPIONATE 50 MCG/ACT NA SUSP
2.0000 | Freq: Every day | NASAL | 6 refills | Status: DC
Start: 2023-07-05 — End: 2023-10-18

## 2023-07-05 MED ORDER — LOSARTAN POTASSIUM 50 MG PO TABS
50.0000 mg | ORAL_TABLET | Freq: Every day | ORAL | 3 refills | Status: AC
Start: 2023-07-05 — End: ?

## 2023-07-05 MED ORDER — LEVOCETIRIZINE DIHYDROCHLORIDE 5 MG PO TABS
5.0000 mg | ORAL_TABLET | Freq: Every evening | ORAL | 3 refills | Status: AC
Start: 2023-07-05 — End: ?

## 2023-07-05 NOTE — Progress Notes (Signed)
 SUBJECTIVE:   Chief Complaint  Patient presents with   Medical Management of Chronic Issues    6 month follow up   HPI Presents for follow up chronic disease management  Discussed the use of AI scribe software for clinical note transcription with the patient, who gave verbal consent to proceed.  History of Present Illness Stacy Miles is a 48 year old female with hypertension and parathyroid  issues who presents for a six-month follow-up.  She manages her hypertension with losartan  and requires a refill. She has not had recent lab work and is unsure if it is needed.  There is a concern regarding high calcium levels related to parathyroid  issues. She has not completed the necessary thyroid  ultrasound due to insurance network issues. She was referred to a surgeon in [place] [employer], but the referral process needs to be restarted as she was not in the network. She is considering ENT as an option for the ultrasound.  She was supposed to follow up with a hepatologist at [facility] for an MRI of her liver and a colonoscopy. However, she has been unable to schedule these appointments due to communication issues. She received a letter regarding the colonoscopy but is unsure about the MRI referral details.  She experiences seasonal sinus issues and manages them with over-the-counter Tylenol  Sinus. This year, she has noticed ringing and popping in her ear, which her mother suggested might be due to sinus drainage. She is not taking any allergy medications other than Tylenol  Sinus and has not used nasal sprays due to a consistently runny nose. She describes the ear issue as a 'small ringing sound' and sometimes feeling 'stopped up' before it 'pops'.  She is currently on Micronor  for birth control, which is managed by her OB GYN.    PERTINENT PMH / PSH: As above  OBJECTIVE:  BP 120/72   Pulse 85   Temp 98.4 F (36.9 C)   Resp 20   Ht 5\' 6"  (1.676 m)   Wt 246 lb 6 oz (111.8 kg)   LMP 06/18/2023  (Approximate)   SpO2 99%   BMI 39.77 kg/m    Physical Exam Vitals reviewed.  Constitutional:      General: She is not in acute distress.    Appearance: She is obese. She is not ill-appearing.  HENT:     Head: Normocephalic.     Right Ear: Tympanic membrane, ear canal and external ear normal.     Left Ear: Tympanic membrane, ear canal and external ear normal.     Nose: Nose normal.     Mouth/Throat:     Mouth: Mucous membranes are moist.  Eyes:     Extraocular Movements: Extraocular movements intact.     Conjunctiva/sclera: Conjunctivae normal.     Pupils: Pupils are equal, round, and reactive to light.  Neck:     Thyroid : No thyromegaly or thyroid  tenderness.     Vascular: No carotid bruit.  Cardiovascular:     Rate and Rhythm: Normal rate and regular rhythm.     Pulses: Normal pulses.     Heart sounds: Normal heart sounds.  Pulmonary:     Effort: Pulmonary effort is normal.     Breath sounds: Normal breath sounds.  Abdominal:     General: Bowel sounds are normal. There is no distension.     Palpations: Abdomen is soft.     Tenderness: There is no abdominal tenderness. There is no right CVA tenderness, left CVA tenderness, guarding or rebound.  Musculoskeletal:  General: Normal range of motion.     Cervical back: Normal range of motion.     Right lower leg: No edema.     Left lower leg: No edema.  Lymphadenopathy:     Cervical: No cervical adenopathy.  Skin:    Capillary Refill: Capillary refill takes less than 2 seconds.  Neurological:     General: No focal deficit present.     Mental Status: She is alert and oriented to person, place, and time. Mental status is at baseline.     Motor: No weakness.  Psychiatric:        Mood and Affect: Mood normal.        Behavior: Behavior normal.        Thought Content: Thought content normal.        Judgment: Judgment normal.           07/05/2023   11:35 AM 02/14/2023    7:54 AM 12/28/2022    8:10 AM 07/27/2022    11:00 AM 07/06/2022    9:58 AM  Depression screen PHQ 2/9  Decreased Interest 0 1 0 0 0  Down, Depressed, Hopeless 0 0 0 0 0  PHQ - 2 Score 0 1 0 0 0  Altered sleeping 0 0 0    Tired, decreased energy 1 1 1     Change in appetite 2 2 1     Feeling bad or failure about yourself  0 0 0    Trouble concentrating 0 0 0    Moving slowly or fidgety/restless 0 0 0    Suicidal thoughts 0 0 0    PHQ-9 Score 3 4 2     Difficult doing work/chores Not difficult at all Somewhat difficult Not difficult at all        07/05/2023   11:36 AM 02/14/2023    7:54 AM 12/28/2022    8:10 AM 07/06/2022    9:58 AM  GAD 7 : Generalized Anxiety Score  Nervous, Anxious, on Edge 0 0 0 0  Control/stop worrying 0 1 1 0  Worry too much - different things 1 1 1  0  Trouble relaxing 0 0 0 0  Restless 0 0 0 0  Easily annoyed or irritable 1 0 1 0  Afraid - awful might happen 0 0 0 0  Total GAD 7 Score 2 2 3  0  Anxiety Difficulty Not difficult at all Not difficult at all Not difficult at all Not difficult at all    ASSESSMENT/PLAN:  Allergic rhinitis, unspecified seasonality, unspecified trigger Assessment & Plan: Symptoms due to nasal congestion. Consistent use of nasal sprays and medication emphasized. - Prescribe Astelin  nasal spray. - Prescribe Flonase  nasal spray. - Prescribe Xyzal  for nighttime use. - Instruct on consistent use of nasal sprays and medication.  Orders: -     Fluticasone  Propionate; Place 2 sprays into both nostrils daily.  Dispense: 16 g; Refill: 6 -     Azelastine  HCl; Place 2 sprays into both nostrils 2 (two) times daily. Use in each nostril as directed  Dispense: 30 mL; Refill: 12 -     Levocetirizine Dihydrochloride ; Take 1 tablet (5 mg total) by mouth every evening.  Dispense: 30 tablet; Refill: 3  Primary hypertension Assessment & Plan: Blood pressure well-controlled with losartan . - Refill losartan  50 mg  - Check Cmet  Orders: -     Losartan  Potassium; Take 1 tablet (50 mg total)  by mouth daily.  Dispense: 90 tablet; Refill: 3 -  Comprehensive metabolic panel with GFR; Future -     CBC with Differential/Platelet; Future  Elevated PTHrP level Assessment & Plan: Elevated calcium levels indicate parathyroid  disorder. Was unable to have thyroid  u/s completed. Gen Surgery not in network for insurance recommendations per patient. - Refer to ENT for evaluation  - Order Sestamibi scan - Thyroid  ultrasound previously - Dexa scan previously ordered - Recheck calcium, PTH levels  Orders: -     Parathyroid  hormone, intact (no Ca); Future  Vitamin D  deficiency -     VITAMIN D  25 Hydroxy (Vit-D Deficiency, Fractures); Future -     Cholecalciferol ; Take 1 capsule (50,000 Units total) by mouth once a week. d3  Dispense: 13 capsule; Refill: 1  Morbid obesity with BMI of 40.0-44.9, adult (HCC) Assessment & Plan: Discussed lifestyle changes, including dietary modifications and keeping a food journal. Also discussed potential weight loss medications and clinics, but patient prefers to try lifestyle changes first. -Consider nutrition consult.   Thyroid  disorder screening -     TSH; Future  Lipid screening -     Lipid panel; Future  Abnormal glucose -     Hemoglobin A1c; Future  Vitamin B 12 deficiency -     Vitamin B12; Future  Liver lesion Assessment & Plan: MRI and hepatologist follow-up at [facility] delayed due to communication issues. Direct visit to [facility] recommended for scheduling. - Advise her to visit [facility] to schedule an appointment with a hepatologist. - Dr Antony Baumgartner, GI, made aware to facilitate the referral process. - Discontinue oral contraceptives given liver adenoma     PDMP reviewed  Return if symptoms worsen or fail to improve, for PCP.  Valli Gaw, MD

## 2023-07-05 NOTE — Patient Instructions (Addendum)
 It was a pleasure meeting you today. Thank you for allowing me to take part in your health care.  Our goals for today as we discussed include:  Stop birth control pills.  Call your OB for discussion of other options.  Go to [facility] clinic today to get information regarding scheduling appointment to see surgeon for liver lesion  Refill sent for requested medication.  Please schedule appointment for fasting labs. Fast for 10 hours.  Will set up referral for evaluation of elevation of calcium levels.  is a list of the screening recommended for you and due dates:  Health Maintenance  Topic Date Due   HIV Screening  Never done   Hepatitis C Screening  Never done   HPV Vaccine (2 - 3-dose SCDM series) 12/04/2019   Colon Cancer Screening  Never done   Flu Shot  10/26/2023   Mammogram  05/02/2024   Pap with HPV screening  12/13/2025   DTaP/Tdap/Td vaccine (9 - Td or Tdap) 07/20/2027   Meningitis B Vaccine  Aged Out   COVID-19 Vaccine  Discontinued      If you have any questions or concerns, please do not hesitate to call the office at [phone].  I look forward to our next visit and until then take care and stay safe.  Regards,   Dana Allan, MD   [facility]

## 2023-07-06 ENCOUNTER — Encounter: Payer: Self-pay | Admitting: Family Medicine

## 2023-07-06 ENCOUNTER — Other Ambulatory Visit: Payer: Self-pay | Admitting: Family Medicine

## 2023-07-06 DIAGNOSIS — R7989 Other specified abnormal findings of blood chemistry: Secondary | ICD-10-CM

## 2023-07-09 ENCOUNTER — Other Ambulatory Visit (INDEPENDENT_AMBULATORY_CARE_PROVIDER_SITE_OTHER): Payer: PRIVATE HEALTH INSURANCE

## 2023-07-09 DIAGNOSIS — E559 Vitamin D deficiency, unspecified: Secondary | ICD-10-CM

## 2023-07-09 DIAGNOSIS — R7989 Other specified abnormal findings of blood chemistry: Secondary | ICD-10-CM

## 2023-07-09 DIAGNOSIS — R7309 Other abnormal glucose: Secondary | ICD-10-CM | POA: Diagnosis not present

## 2023-07-09 DIAGNOSIS — Z1329 Encounter for screening for other suspected endocrine disorder: Secondary | ICD-10-CM

## 2023-07-09 DIAGNOSIS — Z13220 Encounter for screening for lipoid disorders: Secondary | ICD-10-CM

## 2023-07-09 DIAGNOSIS — I10 Essential (primary) hypertension: Secondary | ICD-10-CM

## 2023-07-09 DIAGNOSIS — E538 Deficiency of other specified B group vitamins: Secondary | ICD-10-CM | POA: Diagnosis not present

## 2023-07-09 LAB — COMPREHENSIVE METABOLIC PANEL WITH GFR
ALT: 23 U/L (ref 0–35)
AST: 22 U/L (ref 0–37)
Albumin: 4.3 g/dL (ref 3.5–5.2)
Alkaline Phosphatase: 254 U/L — ABNORMAL HIGH (ref 39–117)
BUN: 13 mg/dL (ref 6–23)
CO2: 24 meq/L (ref 19–32)
Calcium: 11.5 mg/dL — ABNORMAL HIGH (ref 8.4–10.5)
Chloride: 110 meq/L (ref 96–112)
Creatinine, Ser: 0.79 mg/dL (ref 0.40–1.20)
GFR: 89.56 mL/min (ref >60.00)
Glucose, Bld: 99 mg/dL (ref 70–99)
Potassium: 4.1 meq/L (ref 3.5–5.1)
Sodium: 141 meq/L (ref 135–145)
Total Bilirubin: 0.5 mg/dL (ref 0.2–1.2)
Total Protein: 7.1 g/dL (ref 6.0–8.3)

## 2023-07-09 LAB — LIPID PANEL
Cholesterol: 202 mg/dL — ABNORMAL HIGH (ref 0–200)
HDL: 71.1 mg/dL (ref >39.00)
LDL Cholesterol: 114 mg/dL — ABNORMAL HIGH (ref 0–99)
NonHDL: 130.84
Total CHOL/HDL Ratio: 3
Triglycerides: 86 mg/dL (ref 0.0–149.0)
VLDL: 17.2 mg/dL (ref 0.0–40.0)

## 2023-07-09 LAB — CBC WITH DIFFERENTIAL/PLATELET
Basophils Absolute: 0 10*3/uL (ref 0.0–0.1)
Basophils Relative: 0.5 % (ref 0.0–3.0)
Eosinophils Absolute: 0 10*3/uL (ref 0.0–0.7)
Eosinophils Relative: 1.2 % (ref 0.0–5.0)
HCT: 42.2 % (ref 36.0–46.0)
Hemoglobin: 14.5 g/dL (ref 12.0–15.0)
Lymphocytes Relative: 45 % (ref 12.0–46.0)
Lymphs Abs: 1.7 10*3/uL (ref 0.7–4.0)
MCHC: 34.2 g/dL (ref 30.0–36.0)
MCV: 91 fl (ref 78.0–100.0)
Monocytes Absolute: 0.3 10*3/uL (ref 0.1–1.0)
Monocytes Relative: 7 % (ref 3.0–12.0)
Neutro Abs: 1.7 10*3/uL (ref 1.4–7.7)
Neutrophils Relative %: 46.3 % (ref 43.0–77.0)
Platelets: 263 10*3/uL (ref 150.0–400.0)
RBC: 4.64 Mil/uL (ref 3.87–5.11)
RDW: 13.3 % (ref 11.5–15.5)
WBC: 3.7 10*3/uL — ABNORMAL LOW (ref 4.0–10.5)

## 2023-07-09 LAB — VITAMIN B12: Vitamin B-12: 399 pg/mL (ref 211–911)

## 2023-07-09 LAB — HEMOGLOBIN A1C: Hgb A1c MFr Bld: 5.6 % (ref 4.6–6.5)

## 2023-07-09 LAB — VITAMIN D 25 HYDROXY (VIT D DEFICIENCY, FRACTURES): VITD: 17.9 ng/mL — ABNORMAL LOW (ref 30.00–100.00)

## 2023-07-09 LAB — TSH: TSH: 0.98 u[IU]/mL (ref 0.35–5.50)

## 2023-07-10 ENCOUNTER — Encounter: Payer: Self-pay | Admitting: Family

## 2023-07-10 LAB — PARATHYROID HORMONE, INTACT (NO CA): PTH: 99 pg/mL — ABNORMAL HIGH (ref 16–77)

## 2023-07-15 ENCOUNTER — Encounter: Payer: Self-pay | Admitting: Family Medicine

## 2023-07-15 MED ORDER — CHOLECALCIFEROL 1.25 MG (50000 UT) PO CAPS: 50000.0000 [IU] | ORAL_CAPSULE | ORAL | 1 refills | Status: DC

## 2023-07-15 NOTE — Assessment & Plan Note (Addendum)
 MRI and hepatologist follow-up at [facility] delayed due to communication issues. Direct visit to [facility] recommended for scheduling. - Advise her to visit [facility] to schedule an appointment with a hepatologist. - Dr Antony Baumgartner, GI, made aware to facilitate the referral process. - Discontinue oral contraceptives given liver adenoma

## 2023-07-15 NOTE — Assessment & Plan Note (Signed)
 Discussed lifestyle changes, including dietary modifications and keeping a food journal. Also discussed potential weight loss medications and clinics, but patient prefers to try lifestyle changes first. -Consider nutrition consult.

## 2023-07-15 NOTE — Assessment & Plan Note (Signed)
 Blood pressure well-controlled with losartan . - Refill losartan  50 mg  - Check Cmet

## 2023-07-15 NOTE — Assessment & Plan Note (Signed)
 Symptoms due to nasal congestion. Consistent use of nasal sprays and medication emphasized. - Prescribe Astelin  nasal spray. - Prescribe Flonase  nasal spray. - Prescribe Xyzal  for nighttime use. - Instruct on consistent use of nasal sprays and medication.

## 2023-07-15 NOTE — Assessment & Plan Note (Addendum)
 Elevated calcium levels indicate parathyroid  disorder. Was unable to have thyroid  u/s completed. Gen Surgery not in network for insurance recommendations per patient. - Refer to ENT for evaluation  - Order Sestamibi scan - Thyroid  ultrasound previously - Dexa scan previously ordered - Recheck calcium, PTH levels

## 2023-07-16 NOTE — Addendum Note (Signed)
 Addended by: Barb Levers on: 07/16/2023 02:16 PM   Modules accepted: Orders

## 2023-07-26 ENCOUNTER — Inpatient Hospital Stay: Payer: PRIVATE HEALTH INSURANCE

## 2023-07-26 ENCOUNTER — Ambulatory Visit
Admission: RE | Admit: 2023-07-26 | Discharge: 2023-07-26 | Disposition: A | Discharge status: Home / Self Care | Payer: PRIVATE HEALTH INSURANCE | Source: Ambulatory Visit | Attending: Family Medicine | Admitting: Family Medicine

## 2023-07-26 ENCOUNTER — Inpatient Hospital Stay: Payer: PRIVATE HEALTH INSURANCE | Admitting: Oncology

## 2023-07-26 DIAGNOSIS — R9389 Abnormal findings on diagnostic imaging of other specified body structures: Secondary | ICD-10-CM | POA: Insufficient documentation

## 2023-07-26 DIAGNOSIS — R7989 Other specified abnormal findings of blood chemistry: Secondary | ICD-10-CM | POA: Insufficient documentation

## 2023-07-26 MED ORDER — TECHNETIUM TC 99M SESTAMIBI - CARDIOLITE
27.1000 | Freq: Once | INTRAVENOUS | Status: AC | PRN
  Administered 2023-07-26: 27.1 via INTRAVENOUS

## 2023-08-09 ENCOUNTER — Inpatient Hospital Stay: Payer: PRIVATE HEALTH INSURANCE | Attending: Oncology | Admitting: Oncology

## 2023-08-09 ENCOUNTER — Encounter: Payer: Self-pay | Admitting: Oncology

## 2023-08-09 ENCOUNTER — Inpatient Hospital Stay: Payer: PRIVATE HEALTH INSURANCE

## 2023-08-09 VITALS — BP 152/72 | HR 76 | Temp 96.9°F | Resp 19 | Wt 250.7 lb

## 2023-08-09 DIAGNOSIS — D72819 Decreased white blood cell count, unspecified: Secondary | ICD-10-CM | POA: Insufficient documentation

## 2023-08-09 LAB — CBC WITH DIFFERENTIAL/PLATELET
Abs Immature Granulocytes: 0.02 10*3/uL (ref 0.00–0.07)
Basophils Absolute: 0 10*3/uL (ref 0.0–0.1)
Basophils Relative: 1 %
Eosinophils Absolute: 0.1 10*3/uL (ref 0.0–0.5)
Eosinophils Relative: 1 %
HCT: 39.8 % (ref 36.0–46.0)
Hemoglobin: 14.1 g/dL (ref 12.0–15.0)
Immature Granulocytes: 1 %
Lymphocytes Relative: 47 %
Lymphs Abs: 1.9 10*3/uL (ref 0.7–4.0)
MCH: 31.2 pg (ref 26.0–34.0)
MCHC: 35.4 g/dL (ref 30.0–36.0)
MCV: 88.1 fL (ref 80.0–100.0)
Monocytes Absolute: 0.4 10*3/uL (ref 0.1–1.0)
Monocytes Relative: 10 %
Neutro Abs: 1.6 10*3/uL — ABNORMAL LOW (ref 1.7–7.7)
Neutrophils Relative %: 40 %
Platelets: 212 10*3/uL (ref 150–400)
RBC: 4.52 MIL/uL (ref 3.87–5.11)
RDW: 13.1 % (ref 11.5–15.5)
WBC: 4 10*3/uL (ref 4.0–10.5)
nRBC: 0 % (ref 0.0–0.2)

## 2023-08-09 LAB — TECHNOLOGIST SMEAR REVIEW: Plt Morphology: ADEQUATE

## 2023-08-09 LAB — HEPATITIS PANEL, ACUTE
HCV Ab: NONREACTIVE
Hep A IgM: NONREACTIVE
Hep B C IgM: NONREACTIVE
Hepatitis B Surface Ag: NONREACTIVE

## 2023-08-09 LAB — FOLATE: Folate: 13.1 ng/mL (ref >5.9)

## 2023-08-09 LAB — VITAMIN B12: Vitamin B-12: 321 pg/mL (ref 180–914)

## 2023-08-09 LAB — HIV ANTIBODY (ROUTINE TESTING W REFLEX): HIV Screen 4th Generation wRfx: NONREACTIVE

## 2023-08-09 LAB — LACTATE DEHYDROGENASE: LDH: 157 U/L (ref 98–192)

## 2023-08-09 NOTE — Progress Notes (Signed)
 Hematology/Oncology Consult Note [phone] [phone]    REFERRING PROVIDER: Valli Gaw, MD   CHIEF COMPLAINTS/REASON FOR VISIT:  Evaluation of leukopenia   ASSESSMENT & PLAN:   Leukopenia I discussed with patient that the differential diagnosis of leukopenia is broad, including acute or chronic infection, inflammation, nutrition deficiency, autoimmune disease,  ethnic, or malignant etiology including underlying bone morrow disorders.  For the work up of patient's leukoepenia, I recommend checking CBC;CMP, LDH; smear review, folate, Vitamin B12, hepatitis, HIV, flowcytometry and monoclonal gammopathy workup.     Orders Placed This Encounter  Procedures   Folate    Standing Status:   Future    Number of Occurrences:   1    Expected Date:   08/09/2023    Expiration Date:   08/08/2024   Vitamin B12    Standing Status:   Future    Number of Occurrences:   1    Expected Date:   08/09/2023    Expiration Date:   08/08/2024   CBC with Differential/Platelet    Standing Status:   Future    Number of Occurrences:   1    Expected Date:   08/09/2023    Expiration Date:   08/08/2024   Multiple Myeloma Panel (SPEP&IFE w/QIG)    Standing Status:   Future    Number of Occurrences:   1    Expected Date:   08/09/2023    Expiration Date:   08/08/2024   Kappa/lambda light chains    Standing Status:   Future    Number of Occurrences:   1    Expected Date:   08/09/2023    Expiration Date:   08/08/2024   Flow cytometry panel-leukemia/lymphoma work-up    Standing Status:   Future    Number of Occurrences:   1    Expected Date:   08/09/2023    Expiration Date:   08/08/2024   Lactate dehydrogenase    Standing Status:   Future    Number of Occurrences:   1    Expected Date:   08/09/2023    Expiration Date:   08/08/2024   HIV Antibody (routine testing w rflx)    Standing Status:   Future    Number of Occurrences:   1    Expected Date:   08/09/2023    Expiration Date:    08/08/2024   Hepatitis panel, acute    Standing Status:   Future    Number of Occurrences:   1    Expected Date:   08/09/2023    Expiration Date:   08/08/2024   Technologist smear review    Standing Status:   Future    Number of Occurrences:   1    Expected Date:   08/09/2023    Expiration Date:   08/08/2024    Clinical information::   leukopenia   Follow up TBD All questions were answered. The patient knows to call the clinic with any problems, questions or concerns.  Timmy Forbes, MD, PhD [facility] Hematology Oncology 08/09/2023    HISTORY OF PRESENTING ILLNESS:  Stacy Miles is a 48 y.o. female who was seen in consultation at the request of Valli Gaw, MD for evaluation of leukopenia Reviewed patient's recent labs. 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  Denies history hepatitis or HIV infection Denies history of chronic liver disease She drinks a glass of wine 1-2 time per  week.  Denies dietary restrictions.  Denies Herbal medication    MEDICAL HISTORY:  Past Medical History:  Diagnosis Date   Anemia    Depression    Dysmenorrhea    Fibroid    History of blood transfusion 09/2016   [facility] - 2 units transfused   Hypertension    Insomnia    Iron  deficiency anemia    Stress     SURGICAL HISTORY: Past Surgical History:  Procedure Laterality Date   DILATATION & CURETTAGE/HYSTEROSCOPY WITH MYOSURE N/A 11/13/2016   Procedure: DILATATION & CURETTAGE/HYSTEROSCOPY WITH MYOSURE;  Surgeon: Wanita Gutta, MD;  Location: [facility];  Service: Gynecology;  Laterality: N/A;   DILATION AND CURETTAGE OF UTERUS     IR EMBO TUMOR ORGAN ISCHEMIA INFARCT INC GUIDE ROADMAPPING  11/09/2022   IR FLUORO GUIDED NEEDLE PLC ASPIRATION/INJECTION LOC  11/09/2022   IR RADIOLOGIST EVAL & MGMT  10/10/2022   IR RADIOLOGIST EVAL & MGMT  11/23/2022   OTHER SURGICAL HISTORY     cervical polyp removal      SOCIAL HISTORY: Social History   Socioeconomic History   Marital status: Single    Spouse name: Not on file   Number of children: Not on file   Years of education: Not on file   Highest education level: Not on file  Occupational History   Not on file  Tobacco Use   Smoking status: Never   Smokeless tobacco: Never  Vaping Use   Vaping status: Never Used  Substance and Sexual Activity   Alcohol use: No   Drug use: No   Sexual activity: Not Currently    Birth control/protection: Pill  Other Topics Concern   Not on file  Social History Narrative   In school for criminal justice   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    No kids    Lives in [place]    No guns    Wears seat belt    Safe in relationship    Social Drivers of Corporate investment banker Strain: Not on file  Food Insecurity: Not on file  Transportation Needs: Not on file  Physical Activity: Not on file  Stress: Not on file  Social Connections: Not on file  Intimate Partner Violence: Not on file    FAMILY HISTORY: Family History  Problem Relation Age of Onset   Diabetes Mother    Hypertension Mother    Arthritis Mother    Asthma Mother    COPD Father    Breast cancer Maternal Grandmother        mat great gm    ALLERGIES:  has no known allergies.  MEDICATIONS:  Current Outpatient Medications  Medication Sig Dispense Refill   azelastine  (ASTELIN ) 0.1 % nasal spray Place 2 sprays into both nostrils 2 (two) times daily. Use in each nostril as directed 30 mL 12   Cholecalciferol  1.25 MG (50000 UT) capsule Take 1 capsule (50,000 Units total) by mouth once a week. d3 13 capsule 1   fluticasone  (FLONASE ) 50 MCG/ACT nasal spray Place 2 sprays into both nostrils daily. 16 g 6   ibuprofen  (ADVIL ) 800 MG tablet Take 1 tablet (800 mg total) by mouth every 8 (eight) hours as needed. 30 tablet 1   Iron , Ferrous Sulfate , 325 (65 Fe) MG TABS Take 325 mg by mouth every other  day. 90 tablet 3   levocetirizine (XYZAL ) 5 MG tablet Take 1 tablet (5 mg  total) by mouth every evening. 30 tablet 3   losartan  (COZAAR ) 50 MG tablet Take 1 tablet (50 mg total) by mouth daily. 90 tablet 3   norethindrone  (MICRONOR ) 0.35 MG tablet Take 1 tablet by mouth daily.     No current facility-administered medications for this visit.     Review of Systems  Constitutional:  Negative for appetite change, chills, fatigue and fever.  HENT:   Negative for hearing loss and voice change.   Eyes:  Negative for eye problems.  Respiratory:  Negative for chest tightness and cough.   Cardiovascular:  Negative for chest pain.  Gastrointestinal:  Negative for abdominal distention, abdominal pain and blood in stool.  Endocrine: Negative for hot flashes.  Genitourinary:  Negative for difficulty urinating and frequency.   Musculoskeletal:  Negative for arthralgias.  Skin:  Negative for itching and rash.  Neurological:  Negative for extremity weakness.  Hematological:  Negative for adenopathy.  Psychiatric/Behavioral:  Negative for confusion.     PHYSICAL EXAMINATION: ECOG PERFORMANCE STATUS: 0 - Asymptomatic Vitals:   08/09/23 0941  BP: (!) 152/72  Pulse: 76  Resp: 19  Temp: (!) 96.9 F (36.1 C)  SpO2: 98%   Filed Weights   08/09/23 0941  Weight: 250 lb 11.2 oz (113.7 kg)     Physical Exam Constitutional:      General: She is not in acute distress. HENT:     Head: Normocephalic and atraumatic.  Eyes:     General: No scleral icterus. Cardiovascular:     Rate and Rhythm: Normal rate and regular rhythm.     Heart sounds: Normal heart sounds.  Pulmonary:     Effort: Pulmonary effort is normal. No respiratory distress.     Breath sounds: No wheezing.  Abdominal:     General: Bowel sounds are normal. There is no distension.     Palpations: Abdomen is soft.  Musculoskeletal:        General: No deformity. Normal range of motion.     Cervical back: Normal range of motion and  neck supple.  Skin:    General: Skin is warm and dry.     Findings: No erythema or rash.  Neurological:     Mental Status: She is alert and oriented to person, place, and time. Mental status is at baseline.  Psychiatric:        Mood and Affect: Mood normal.     RADIOGRAPHIC STUDIES: I have personally reviewed the radiological images as listed and agreed with the findings in the report. NM Parathyroid  W/Spect/CT Result Date: 07/31/2023 CLINICAL DATA:  Elevated parathyroid  hormone.  PTH equal 99 EXAM: NM PARATHYROID  SCINTIGRAPHY AND SPECT IMAGING TECHNIQUE: Following intravenous administration of radiopharmaceutical, early and 2-hour delayed planar images were obtained in the anterior projection. Delayed triplanar SPECT images were also obtained at 2 hours. RADIOPHARMACEUTICALS:  27.1 mCi Tc-14m Sestamibi IV COMPARISON:  None Available. FINDINGS: Normal washout of activity from the thyroid  gland at 2 hour planar imaging. SPECT imaging of the neck at 2 hours demonstrates asymmetric activity in the RIGHT lobe of the thyroid  gland. Activity is essentially midgland with no focal lesion identified on the CT portion exam. IMPRESSION: Focal increased asymmetric activity within the mid RIGHT thyroid  gland. 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 Electronically Signed   By: Deboraha Fallow M.D.   On: 07/31/2023 10:58     LABORATORY DATA:  I have reviewed the data as listed    Latest Ref Rng & Units 08/09/2023   10:02 AM  07/09/2023    7:52 AM 01/24/2023    1:49 PM  CBC  WBC 4.0 - 10.5 K/uL 4.0  3.7  3.5   Hemoglobin 12.0 - 15.0 g/dL 69.6  29.5  28.4   Hematocrit 36.0 - 46.0 % 39.8  42.2  40.5   Platelets 150 - 400 K/uL 212  263.0  239.0       Latest Ref Rng & Units 07/09/2023    7:52 AM 01/24/2023    1:49 PM 12/28/2022    8:36 AM  CMP  Glucose 70 - 99 mg/dL 99  81  132   BUN 6 - 23 mg/dL 13  13  11    Creatinine 0.40 - 1.20 mg/dL 4.40  1.02  7.25   Sodium 135 - 145 mEq/L 141  144  139   Potassium 3.5  - 5.1 mEq/L 4.1  4.1  4.1   Chloride 96 - 112 mEq/L 110  109  109   CO2 19 - 32 mEq/L 24  27  24    Calcium 8.4 - 10.5 mg/dL 36.6  44.0  34.7    42.5   Total Protein 6.0 - 8.3 g/dL 7.1  7.2    Total Bilirubin 0.2 - 1.2 mg/dL 0.5  0.3    Alkaline Phos 39 - 117 U/L 254  253    AST 0 - 37 U/L 22  22    ALT 0 - 35 U/L 23  23         RADIOGRAPHIC STUDIES: I have personally reviewed the radiological images as listed and agreed with the findings in the report. NM Parathyroid  W/Spect/CT Result Date: 07/31/2023 CLINICAL DATA:  Elevated parathyroid  hormone.  PTH equal 99 EXAM: NM PARATHYROID  SCINTIGRAPHY AND SPECT IMAGING TECHNIQUE: Following intravenous administration of radiopharmaceutical, early and 2-hour delayed planar images were obtained in the anterior projection. Delayed triplanar SPECT images were also obtained at 2 hours. RADIOPHARMACEUTICALS:  27.1 mCi Tc-67m Sestamibi IV COMPARISON:  None Available. FINDINGS: Normal washout of activity from the thyroid  gland at 2 hour planar imaging. SPECT imaging of the neck at 2 hours demonstrates asymmetric activity in the RIGHT lobe of the thyroid  gland. Activity is essentially midgland with no focal lesion identified on the CT portion exam. IMPRESSION: Focal increased asymmetric activity within the mid RIGHT thyroid  gland. 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 Electronically Signed   By: Deboraha Fallow M.D.   On: 07/31/2023 10:58

## 2023-08-09 NOTE — Assessment & Plan Note (Signed)
I discussed with patient that the differential diagnosis of leukopenia is broad, including acute or chronic infection, inflammation, nutrition deficiency, autoimmune disease,  ethnic, or malignant etiology including underlying bone morrow disorders.  For the work up of patient's leukoepenia, I recommend checking CBC;CMP, LDH; smear review, folate, Vitamin B12, hepatitis, HIV, flowcytometry and monoclonal gammopathy workup.   

## 2023-08-10 LAB — KAPPA/LAMBDA LIGHT CHAINS
Kappa free light chain: 15.3 mg/L (ref 3.3–19.4)
Kappa, lambda light chain ratio: 1.39 (ref 0.26–1.65)
Lambda free light chains: 11 mg/L (ref 5.7–26.3)

## 2023-08-13 LAB — MULTIPLE MYELOMA PANEL, SERUM
Albumin SerPl Elph-Mcnc: 3.6 g/dL (ref 2.9–4.4)
Albumin/Glob SerPl: 1.3 (ref 0.7–1.7)
Alpha 1: 0.2 g/dL (ref 0.0–0.4)
Alpha2 Glob SerPl Elph-Mcnc: 0.6 g/dL (ref 0.4–1.0)
B-Globulin SerPl Elph-Mcnc: 1.1 g/dL (ref 0.7–1.3)
Gamma Glob SerPl Elph-Mcnc: 1 g/dL (ref 0.4–1.8)
Globulin, Total: 2.9 g/dL (ref 2.2–3.9)
IgA: 327 mg/dL (ref 87–352)
IgG (Immunoglobin G), Serum: 1167 mg/dL (ref 586–1602)
IgM (Immunoglobulin M), Srm: 58 mg/dL (ref 26–217)
Total Protein ELP: 6.5 g/dL (ref 6.0–8.5)

## 2023-08-14 ENCOUNTER — Ambulatory Visit: Payer: Self-pay | Admitting: Oncology

## 2023-08-14 LAB — COMP PANEL: LEUKEMIA/LYMPHOMA

## 2023-08-15 ENCOUNTER — Encounter: Payer: Self-pay | Admitting: Family

## 2023-08-15 NOTE — Telephone Encounter (Signed)
-----   Message from Timmy Forbes sent at 08/14/2023 11:05 PM EDT ----- Please let patient know that her blood work results are good.  B12 is low normal end. Recommend her to take B12 1000mcg daily, with goal of B12 >400. Follow up PRN.

## 2023-08-15 NOTE — Telephone Encounter (Signed)
 Detailed VM left on ph and mychart message sent with MD recommendation.

## 2023-08-17 ENCOUNTER — Ambulatory Visit: Payer: Self-pay | Admitting: Family Medicine

## 2023-08-21 NOTE — Addendum Note (Signed)
 Addended by: Barb Levers on: 08/21/2023 11:51 AM   Modules accepted: Orders

## 2023-10-04 ENCOUNTER — Other Ambulatory Visit (HOSPITAL_COMMUNITY)
Admission: RE | Admit: 2023-10-04 | Discharge: 2023-10-04 | Disposition: A | Discharge status: Home / Self Care | Payer: PRIVATE HEALTH INSURANCE | Source: Ambulatory Visit | Attending: Obstetrics & Gynecology | Admitting: Obstetrics & Gynecology

## 2023-10-04 ENCOUNTER — Ambulatory Visit (HOSPITAL_BASED_OUTPATIENT_CLINIC_OR_DEPARTMENT_OTHER): Payer: PRIVATE HEALTH INSURANCE | Admitting: Obstetrics & Gynecology

## 2023-10-04 ENCOUNTER — Encounter (HOSPITAL_BASED_OUTPATIENT_CLINIC_OR_DEPARTMENT_OTHER): Payer: Self-pay | Admitting: Obstetrics & Gynecology

## 2023-10-04 VITALS — BP 135/81 | HR 77 | Wt 249.8 lb

## 2023-10-04 DIAGNOSIS — N946 Dysmenorrhea, unspecified: Secondary | ICD-10-CM | POA: Diagnosis not present

## 2023-10-04 DIAGNOSIS — Z01411 Encounter for gynecological examination (general) (routine) with abnormal findings: Secondary | ICD-10-CM | POA: Diagnosis not present

## 2023-10-04 DIAGNOSIS — E611 Iron deficiency: Secondary | ICD-10-CM

## 2023-10-04 DIAGNOSIS — N9489 Other specified conditions associated with female genital organs and menstrual cycle: Secondary | ICD-10-CM

## 2023-10-04 DIAGNOSIS — Z124 Encounter for screening for malignant neoplasm of cervix: Secondary | ICD-10-CM | POA: Diagnosis present

## 2023-10-04 DIAGNOSIS — D351 Benign neoplasm of parathyroid gland: Secondary | ICD-10-CM | POA: Diagnosis not present

## 2023-10-04 DIAGNOSIS — Z01419 Encounter for gynecological examination (general) (routine) without abnormal findings: Secondary | ICD-10-CM

## 2023-10-04 MED ORDER — NORETHINDRONE 0.35 MG PO TABS: 1.0000 | ORAL_TABLET | Freq: Every day | ORAL | 3 refills | Status: AC

## 2023-10-04 MED ORDER — IBUPROFEN 800 MG PO TABS: 800.0000 mg | ORAL_TABLET | Freq: Three times a day (TID) | ORAL | 1 refills | Status: AC | PRN

## 2023-10-04 NOTE — Progress Notes (Signed)
 ANNUAL EXAM Patient name: Stacy Miles MRN [id number]  Date of birth: 07-May-1975 Chief Complaint:   AEX  History of Present Illness:   Stacy Miles is a 48 y.o. G0P0000 African-American female being seen today for a routine annual exam.  Had COLOMBIA done August last year.  Pt reports bleeding is almost non-existent.  She didn't have a cycle for months.  Most recent cycle was in May.    Unrelated, blood work last year showed elevated liver enzymes.  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  Discussed with pt not using estrogen therapy in the future.  On POP now.  Given h/o bleeding and fibroids, feel this is safest option for her other than no hormone use.  Has follow up with Dr. Therisa (who moved to [facility]) in September.  Has also been diagnosed with parathyroid  adenoma.  Had elevated calcium and PTH levels.  Imaging showed single parathryoid adenoma.  Has been referred to ENT.  Needs to see general surgery.  Referral will be placed.    No LMP recorded. (Menstrual status: Oral contraceptives).   Last pap 12/13/2020. Results were: NILM w/ HRHPV negative. H/O abnormal pap: no Last mammogram: 05/03/2023. Results were: normal. Family h/o breast cancer: yes MGGM Last colonoscopy: referral has been placed.  She has appt scheduled at [facility].       07/05/2023   11:35 AM 02/14/2023    7:54 AM 12/28/2022    8:10 AM 07/27/2022   11:00 AM 07/06/2022    9:58 AM  Depression screen PHQ 2/9  Decreased Interest 0 1 0 0 0  Down, Depressed, Hopeless 0 0 0 0 0  PHQ - 2 Score 0 1 0 0 0  Altered sleeping 0 0 0    Tired, decreased energy 1 1 1     Change in appetite 2 2 1     Feeling bad or failure about yourself  0 0 0    Trouble concentrating 0 0 0    Moving slowly or fidgety/restless 0 0 0    Suicidal thoughts 0 0 0    PHQ-9 Score 3 4 2     Difficult doing work/chores Not difficult at all Somewhat difficult Not difficult at all       Review of Systems:   Pertinent items are noted in HPI Denies any headaches,  blurred vision, fatigue, shortness of breath, chest pain, abdominal pain, abnormal vaginal discharge/itching/odor/irritation, problems with periods, bowel movements, urination, or intercourse unless otherwise stated above. Pertinent History Reviewed:  Reviewed past medical,surgical, social and family history.  Reviewed problem list, medications and allergies. Physical Assessment:   Vitals:   10/04/23 0850  BP: 135/81  Pulse: 77  SpO2: 99%  Weight: 249 lb 12.8 oz (113.3 kg)  Body mass index is 40.32 kg/m.        Physical Examination:   General appearance - well appearing, and in no distress  Mental status - alert, oriented to person, place, and time  Psych:  She has a normal mood and affect  Skin - warm and dry, normal color, no suspicious lesions noted  Chest - effort normal, all lung fields clear to auscultation bilaterally  Heart - normal rate and regular rhythm  Neck:  midline trachea, no thyromegaly or nodules  Breasts - breasts appear normal, no suspicious masses, no skin or nipple changes or  axillary nodes  Abdomen - soft, nontender, nondistended, no masses or organomegaly  Pelvic - VULVA: normal appearing vulva with no masses, tenderness or lesions  VAGINA: normal appearing vagina with normal color and discharge, no lesions   CERVIX: normal appearing cervix without discharge or lesions, no CMT  Thin prep pap is done with HR HPV cotesting  UTERUS: uterus is felt to be normal size, shape, consistency and nontender   ADNEXA: No adnexal masses or tenderness noted.  Rectal - normal rectal, good sphincter tone, no masses felt.   Extremities:  No swelling or varicosities noted  Chaperone present for exam  Assessment & Plan:  1. Well woman exam with routine gynecological exam (Primary) - Pap smear with HR HPV obtained today - Mammogram 04/2024 - Colonoscopy appt already scheduled - lab work done with PCP - vaccines reviewed/updated  2. Parathyroid  adenoma - Ambulatory  referral to General Surgery  3. Iron  deficiency - Ferritin - Iron  - if normal, will have pt stop oral iron   4. Cervical cancer screening - Cytology - PAP( [facility])  5. Dysmenorrhea - ibuprofen  (ADVIL ) 800 MG tablet; Take 1 tablet (800 mg total) by mouth every 8 (eight) hours as needed.  Dispense: 30 tablet; Refill: 1    Orders Placed This Encounter  Procedures   Ferritin   Iron    Ambulatory referral to General Surgery    Meds:  Meds ordered this encounter  Medications   norethindrone  (MICRONOR ) 0.35 MG tablet    Sig: Take 1 tablet (0.35 mg total) by mouth daily.    Dispense:  84 tablet    Refill:  3   ibuprofen  (ADVIL ) 800 MG tablet    Sig: Take 1 tablet (800 mg total) by mouth every 8 (eight) hours as needed.    Dispense:  30 tablet    Refill:  1    Follow-up: Return in about 1 year (around 10/03/2024).  Ronal GORMAN Pinal, MD 10/04/2023 9:40 AM

## 2023-10-05 ENCOUNTER — Ambulatory Visit (HOSPITAL_BASED_OUTPATIENT_CLINIC_OR_DEPARTMENT_OTHER): Payer: Self-pay | Admitting: Obstetrics & Gynecology

## 2023-10-05 ENCOUNTER — Other Ambulatory Visit (HOSPITAL_BASED_OUTPATIENT_CLINIC_OR_DEPARTMENT_OTHER): Payer: Self-pay | Admitting: Obstetrics & Gynecology

## 2023-10-05 DIAGNOSIS — R7989 Other specified abnormal findings of blood chemistry: Secondary | ICD-10-CM

## 2023-10-05 LAB — FERRITIN: Ferritin: 186 ng/mL — ABNORMAL HIGH (ref 15–150)

## 2023-10-05 LAB — IRON: Iron: 89 ug/dL (ref 27–159)

## 2023-10-10 LAB — CYTOLOGY - PAP
Comment: NEGATIVE
Diagnosis: NEGATIVE
High risk HPV: NEGATIVE

## 2023-10-18 ENCOUNTER — Ambulatory Visit (INDEPENDENT_AMBULATORY_CARE_PROVIDER_SITE_OTHER): Payer: PRIVATE HEALTH INSURANCE | Admitting: Family Medicine

## 2023-10-18 ENCOUNTER — Encounter: Payer: Self-pay | Admitting: Family Medicine

## 2023-10-18 VITALS — BP 122/66 | HR 75 | Ht 66.0 in | Wt 247.0 lb

## 2023-10-18 DIAGNOSIS — K769 Liver disease, unspecified: Secondary | ICD-10-CM

## 2023-10-18 DIAGNOSIS — I10 Essential (primary) hypertension: Secondary | ICD-10-CM

## 2023-10-18 DIAGNOSIS — Z6841 Body Mass Index (BMI) 40.0 and over, adult: Secondary | ICD-10-CM

## 2023-10-18 DIAGNOSIS — E559 Vitamin D deficiency, unspecified: Secondary | ICD-10-CM

## 2023-10-18 DIAGNOSIS — D509 Iron deficiency anemia, unspecified: Secondary | ICD-10-CM

## 2023-10-18 DIAGNOSIS — F32A Depression, unspecified: Secondary | ICD-10-CM

## 2023-10-18 DIAGNOSIS — R7303 Prediabetes: Secondary | ICD-10-CM

## 2023-10-18 DIAGNOSIS — J309 Allergic rhinitis, unspecified: Secondary | ICD-10-CM

## 2023-10-18 MED ORDER — AZELASTINE HCL 0.1 % NA SOLN: 2.0000 | NASAL | Status: AC | PRN

## 2023-10-18 MED ORDER — TIRZEPATIDE 5 MG/0.5ML ~~LOC~~ SOAJ: 5.0000 mg | SUBCUTANEOUS | 3 refills | Status: DC

## 2023-10-18 MED ORDER — ZEPBOUND 2.5 MG/0.5ML ~~LOC~~ SOAJ: 2.5000 mg | SUBCUTANEOUS | 0 refills | Status: DC

## 2023-10-18 MED ORDER — FLUTICASONE PROPIONATE 50 MCG/ACT NA SUSP: 2.0000 | NASAL | Status: AC | PRN

## 2023-10-18 NOTE — Progress Notes (Signed)
 New patient visit   Patient: Stacy Miles   DOB: 09-20-1975   48 y.o. Female  MRN: [id number] Visit Date: 10/18/2023  Today's healthcare provider: Rockie Agent, MD   Chief Complaint  Patient presents with   Establish Care    Discuss weight loss and treatment    Care Management    Pattern of eating:General  Are you exercising:No    Vaccine: Denied and she is scheduled with [facility] for her colonoscopy in Oct       Subjective    Stacy Miles is a 48 y.o. female who presents today as a new patient to establish care.   HPI     Establish Care    Additional comments: Discuss weight loss and treatment         Care Management    Additional comments: Pattern of eating:General  Are you exercising:No    Vaccine: Denied and she is scheduled with [facility] for her colonoscopy in Oct          Last edited by Thelbert Eulalio HERO, CMA on 10/18/2023 10:52 AM.       Discussed the use of AI scribe software for clinical note transcription with the patient, who gave verbal consent to proceed.  History of Present Illness Stacy Miles is a 48 year old female who presents for weight management.  She has experienced significant weight gain over a short period and is currently at her heaviest weight of 247 pounds, with a BMI of 39.87. She is concerned about the impact of her weight on her health and wishes to avoid developing diabetes. She reports that her mother has diabetes. Her goal is to reduce her weight to under 200 pounds.  She has a history of hypertension, managed with losartan  50 mg daily. She consumes caffeine regularly, particularly in the mornings, which may affect her blood pressure. Her medical history includes vitamin D  deficiency, for which she takes a high-dose supplement once a week. She also has allergic rhinitis, managed with Astelin  nasal spray and Flonase  as needed, and takes Xyzal  5 mg at night as needed.  She has a history of depression, characterized by  occasional feelings of regret and low mood related to career choices and weight gain. She has not engaged in therapy or taken medication for depression, although she was previously referred to a therapist by a former doctor.  She has a history of a liver lesion and is scheduled to see a specialist at [facility] in September. She was advised to stop taking iron  supplements due to elevated ferritin levels, which might be related to the liver issue. She is on Micronor  for birth control to manage heavy periods due to fibroids, for which she had an embolization last August.  She has a history of prediabetes, with a recent A1c of 5.6 in April, which is within normal range. Her kidney function was normal, and hepatitis panels were negative. She has a history of high calcium levels and is scheduled for a parathyroid  evaluation today.  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 She reports that she does not currently have a routine for structured exercise, but her jobs involve walking and standing. No sleep apnea, history of pancreatitis, or thyroid  cancer.      Past Medical History:  Diagnosis Date   Anemia    Depression    Dysmenorrhea    Fibroid    History of blood transfusion 09/2016   [facility] -  2 units transfused   Hypertension    Insomnia    Iron  deficiency anemia    Stress     Outpatient Medications Prior to Visit  Medication Sig   Cholecalciferol  1.25 MG (50000 UT) capsule Take 1 capsule (50,000 Units total) by mouth once a week. d3   ibuprofen  (ADVIL ) 800 MG tablet Take 1 tablet (800 mg total) by mouth every 8 (eight) hours as needed.   levocetirizine (XYZAL ) 5 MG tablet Take 1 tablet (5 mg total) by mouth every evening.   losartan  (COZAAR ) 50 MG tablet Take 1 tablet (50 mg total) by mouth daily.   norethindrone  (MICRONOR ) 0.35 MG tablet Take 1 tablet (0.35 mg total) by mouth daily.   [DISCONTINUED] azelastine  (ASTELIN ) 0.1 % nasal  spray Place 2 sprays into both nostrils 2 (two) times daily. Use in each nostril as directed   [DISCONTINUED] fluticasone  (FLONASE ) 50 MCG/ACT nasal spray Place 2 sprays into both nostrils daily.   [DISCONTINUED] Iron , Ferrous Sulfate , 325 (65 Fe) MG TABS Take 325 mg by mouth every other day. (Patient not taking: Reported on 10/18/2023)   No facility-administered medications prior to visit.    Past Surgical History:  Procedure Laterality Date   DILATATION & CURETTAGE/HYSTEROSCOPY WITH MYOSURE N/A 11/13/2016   Procedure: DILATATION & CURETTAGE/HYSTEROSCOPY WITH MYOSURE;  Surgeon: Jannis Kate Norris, MD;  Location: [facility];  Service: Gynecology;  Laterality: N/A;   DILATION AND CURETTAGE OF UTERUS     IR EMBO TUMOR ORGAN ISCHEMIA INFARCT INC GUIDE ROADMAPPING  11/09/2022   IR FLUORO GUIDED NEEDLE PLC ASPIRATION/INJECTION LOC  11/09/2022   IR RADIOLOGIST EVAL & MGMT  10/10/2022   IR RADIOLOGIST EVAL & MGMT  11/23/2022   OTHER SURGICAL HISTORY     cervical polyp removal    Family Status  Relation Name Status   Mother  Alive   Father  Alive   Sister  Alive   Brother  Alive   Sister  Alive   MGM  Deceased   MGF  Deceased   PGM  Alive   PGF  Deceased  No partnership data on file   Family History  Problem Relation Age of Onset   Diabetes Mother    Hypertension Mother    Arthritis Mother    Asthma Mother    COPD Father    Breast cancer Maternal Grandmother        mat great gm   Social History   Socioeconomic History   Marital status: Single    Spouse name: Not on file   Number of children: Not on file   Years of education: Not on file   Highest education level: Not on file  Occupational History   Not on file  Tobacco Use   Smoking status: Never   Smokeless tobacco: Never  Vaping Use   Vaping status: Never Used  Substance and Sexual Activity   Alcohol use: No   Drug use: No   Sexual activity: Not Currently    Birth control/protection: Pill  Other Topics Concern   Not  on file  Social History Narrative   In school for criminal justice   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    No kids    Lives in [place]    No guns    Wears seat belt    Safe in relationship    Social Drivers of Health   Financial Resource Strain: Low Risk  (10/18/2023)  Overall Financial Resource Strain (CARDIA)    Difficulty of Paying Living Expenses: Not hard at all  Food Insecurity: No Food Insecurity (10/18/2023)   Hunger Vital Sign    Worried About Running Out of Food in the Last Year: Never true    Ran Out of Food in the Last Year: Never true  Transportation Needs: No Transportation Needs (10/18/2023)   PRAPARE - Administrator, Civil Service (Medical): No    Lack of Transportation (Non-Medical): No  Physical Activity: Not on file  Stress: No Stress Concern Present (10/18/2023)   [employer] of Occupational Health - Occupational Stress Questionnaire    Feeling of Stress: Not at all  Social Connections: Not on file     No Known Allergies  Immunization History  Administered Date(s) Administered   Dtap, Unspecified 03/29/1977, 05/24/1977, 07/26/1977, 10/03/1978, 09/22/1981   HPV 9-valent 11/06/2019   Hepatitis B, ADULT 07/19/2017, 08/23/2017, 03/07/2018   MMR 02/21/1978, 09/13/1994   Polio, Unspecified 03/29/1977, 05/24/1977, 07/26/1977, 10/03/1978, 09/22/1981   Td 09/12/1993, 09/13/1994   Tdap 07/19/2017    Health Maintenance  Topic Date Due   HPV VACCINES (2 - 3-dose SCDM series) 12/04/2019   Colonoscopy  Never done   INFLUENZA VACCINE  10/26/2023   MAMMOGRAM  05/02/2024   DTaP/Tdap/Td (9 - Td or Tdap) 07/20/2027   Cervical Cancer Screening (HPV/Pap Cotest)  10/03/2028   Hepatitis B Vaccines  Completed   Hepatitis C Screening  Completed   HIV Screening  Completed   Meningococcal B Vaccine  Aged Out   COVID-19 Vaccine  Discontinued    Patient Care Team: Sharma Coyer, MD as PCP - General  (Family Medicine) Jannis Kate Norris, MD as Consulting Physician (Obstetrics and Gynecology) Babara Call, MD as Consulting Physician (Hematology and Oncology)  Review of Systems  Last metabolic panel Lab Results  Component Value Date   GLUCOSE 99 07/09/2023   NA 141 07/09/2023   K 4.1 07/09/2023   CL 110 07/09/2023   CO2 24 07/09/2023   BUN 13 07/09/2023   CREATININE 0.79 07/09/2023   GFR 89.56 07/09/2023   CALCIUM 11.5 (H) 07/09/2023   PHOS 3.1 01/24/2023   PROT 7.1 07/09/2023   ALBUMIN 4.3 07/09/2023   LABGLOB 2.9 08/09/2023   AGRATIO 1.6 07/06/2022   BILITOT 0.5 07/09/2023   ALKPHOS 254 (H) 07/09/2023   AST 22 07/09/2023   ALT 23 07/09/2023   ANIONGAP 12 06/25/2014   Last lipids Lab Results  Component Value Date   CHOL 202 (H) 07/09/2023   HDL 71.10 07/09/2023   LDLCALC 114 (H) 07/09/2023   TRIG 86.0 07/09/2023   CHOLHDL 3 07/09/2023   Last hemoglobin A1c Lab Results  Component Value Date   HGBA1C 5.6 07/09/2023   Last thyroid  functions Lab Results  Component Value Date   TSH 0.98 07/09/2023        Objective    BP 122/66   Pulse 75   Ht 5' 6 (1.676 m)   Wt 247 lb (112 kg)   SpO2 98%   BMI 39.87 kg/m  BP Readings from Last 3 Encounters:  10/18/23 122/66  10/04/23 135/81  08/09/23 (!) 152/72   Wt Readings from Last 3 Encounters:  10/18/23 247 lb (112 kg)  10/04/23 249 lb 12.8 oz (113.3 kg)  08/09/23 250 lb 11.2 oz (113.7 kg)        Depression Screen    10/18/2023   10:49 AM 07/05/2023   11:35 AM 02/14/2023    7:54  AM 12/28/2022    8:10 AM  PHQ 2/9 Scores  PHQ - 2 Score 0 0 1 0  PHQ- 9 Score 3 3 4 2    No results found for any visits on 10/18/23.   Physical Exam Vitals reviewed.  Constitutional:      General: She is not in acute distress.    Appearance: Normal appearance. She is not ill-appearing.  Cardiovascular:     Rate and Rhythm: Normal rate and regular rhythm.  Pulmonary:     Effort: Pulmonary effort is normal. No  respiratory distress.     Breath sounds: No wheezing, rhonchi or rales.  Neurological:     Mental Status: She is alert and oriented to person, place, and time.  Psychiatric:        Mood and Affect: Mood normal.        Behavior: Behavior normal.        Assessment & Plan      Problem List Items Addressed This Visit       Cardiovascular and Mediastinum   Hypertension     Respiratory   Allergic rhinitis   Relevant Medications   azelastine  (ASTELIN ) 0.1 % nasal spray   fluticasone  (FLONASE ) 50 MCG/ACT nasal spray     Digestive   Liver lesion     Other   Vitamin D  deficiency   Prediabetes   Morbid obesity with BMI of 40.0-44.9, adult (HCC) - Primary   Relevant Medications   tirzepatide  (ZEPBOUND ) 2.5 MG/0.5ML Pen   tirzepatide  (MOUNJARO ) 5 MG/0.5ML Pen (Start on 11/15/2023)   IDA (iron  deficiency anemia)   Depression     Assessment & Plan Obesity Chronic obesity with a BMI of 39.87. Interested in weight management to prevent progression to diabetes. Discussed Zepbound  (Tirzepatide ) as a weight management option, a once-weekly injection with potential 15-20% weight loss. Side effects include nausea, constipation, and diarrhea. Emphasized diet and exercise importance. Concerned about side effects impacting work schedule; advised on timing medication. Chose Zepbound  due to efficacy and her sister's positive experience. - Prescribe Zepbound  (Tirzepatide ) 2.5 mg once weekly for 4 weeks, then increase to 5 mg. - Advise on a calorie goal of 2100-2500 calories per day with 25 grams of protein per meal. - Encourage 200 minutes of physical activity per week. - Schedule follow-up in 6 weeks.  Hypertension Hypertension managed with Losartan  50 mg daily. Blood pressure 122/66 mmHg. Discussed caffeine's impact on blood pressure and heart rate. - Continue Losartan  50 mg daily.  Depression Intermittent depressive symptoms related to life decisions and weight gain. No suicidal  ideation. Previously referred to therapy but did not follow up. Discussed using [email or web address] to find a therapist or counselor. Emphasized potential benefit of combining therapy with medication if symptoms worsen. - Encourage use of [email or web address] to find a therapist or counselor. - Monitor symptoms and consider medication if depressive symptoms worsen.  Parathyroid  Adenoma Parathyroid  adenoma with elevated calcium levels. Appointment scheduled with a surgeon for further evaluation. Discussed inconsistency in parathyroid  imaging and need for surgical evaluation. - Attend appointment with surgeon for parathyroid  adenoma evaluation.  Liver Lesion Liver lesion possibly related to birth control or high iron  levels. Referred to a specialist at [facility] with an appointment in September. Previous imaging showed no cancerous changes. Discussed birth control's contribution to the lesion, but current form is the lowest hormone option for fibroid management. - Follow up with specialist at [facility] in September.  General Health Maintenance Up-to-date on Pap smear, next due in 2030. Hepatitis panel  negative for A, B, and C. A1c is 5.6, indicating normal glucose levels.  Follow-up Follow-up plans for various conditions and treatments. - Schedule follow-up in 6 weeks to assess response to Zepbound  and overall health.       Return in about 6 weeks (around 11/29/2023) for Weight MGMT(Zepbound ).      Rockie Agent, MD  [facility] [phone] (phone) [phone] (fax)  [facility]

## 2023-10-18 NOTE — Patient Instructions (Signed)
 [email or web address]

## 2023-10-19 ENCOUNTER — Telehealth: Payer: Self-pay | Admitting: Pharmacy Technician

## 2023-10-19 ENCOUNTER — Other Ambulatory Visit: Payer: Self-pay | Admitting: Surgery

## 2023-10-19 ENCOUNTER — Encounter: Payer: Self-pay | Admitting: Family

## 2023-10-19 ENCOUNTER — Other Ambulatory Visit (HOSPITAL_COMMUNITY): Payer: Self-pay

## 2023-10-19 DIAGNOSIS — E210 Primary hyperparathyroidism: Secondary | ICD-10-CM

## 2023-10-19 NOTE — Telephone Encounter (Signed)
 Pharmacy Patient Advocate Encounter   Received notification from Physician's Office that prior authorization for Zepbound  2.5mg /0.54ml auto-injectors is required/requested.   Insurance verification completed.   The patient is insured through [employer] .   Per test claim: PA required; PA submitted to above mentioned insurance via Prompt PA Key/confirmation #/EOC [id number] Status is pending

## 2023-10-22 ENCOUNTER — Other Ambulatory Visit (HOSPITAL_COMMUNITY): Payer: Self-pay

## 2023-10-22 ENCOUNTER — Other Ambulatory Visit: Payer: Self-pay | Admitting: Surgery

## 2023-10-22 DIAGNOSIS — E210 Primary hyperparathyroidism: Secondary | ICD-10-CM

## 2023-10-23 ENCOUNTER — Other Ambulatory Visit (HOSPITAL_COMMUNITY): Payer: Self-pay

## 2023-10-23 NOTE — Telephone Encounter (Signed)
 Please notify patient that Zepbound  Prior Authorization was denied and schedule her for virtual visit to discuss other medication options for weight management in Sept

## 2023-10-23 NOTE — Telephone Encounter (Signed)
   PA FORM AND CHART NOTES SENT VIA FAX TO [facility] TODAY AT 12:00PM. FAX# [phone] PHONE# [phone]

## 2023-10-23 NOTE — Telephone Encounter (Signed)
 Pharmacy Patient Advocate Encounter  Received notification from [facility] that Prior Authorization for Zepbound  2.5mg /0.39ml auto-injectors  has been DENIED.  Full denial letter will be uploaded to the media tab. See denial reason below.

## 2023-11-01 ENCOUNTER — Ambulatory Visit
Admission: RE | Admit: 2023-11-01 | Discharge: 2023-11-01 | Disposition: A | Discharge status: Home / Self Care | Payer: PRIVATE HEALTH INSURANCE | Source: Ambulatory Visit | Attending: Surgery | Admitting: Surgery

## 2023-11-01 DIAGNOSIS — E210 Primary hyperparathyroidism: Secondary | ICD-10-CM | POA: Insufficient documentation

## 2023-11-07 ENCOUNTER — Ambulatory Visit: Payer: Self-pay | Admitting: Surgery

## 2023-11-07 NOTE — Progress Notes (Signed)
 Sestamibi scan suggests a possible right sided parathyroid  adenoma, but USN does not confirm.  Will proceed with 4D-CT scan of the neck with parathyroid  protocol.  Will contact patient with results when available.  Burnard - please order CT scan.  Krystal Spinner, MD [facility] practice Office: [phone]

## 2023-11-16 ENCOUNTER — Other Ambulatory Visit: Payer: Self-pay | Admitting: Surgery

## 2023-11-16 DIAGNOSIS — E210 Primary hyperparathyroidism: Secondary | ICD-10-CM

## 2023-11-29 ENCOUNTER — Ambulatory Visit
Admission: RE | Admit: 2023-11-29 | Discharge: 2023-11-29 | Disposition: A | Discharge status: Home / Self Care | Payer: PRIVATE HEALTH INSURANCE | Source: Ambulatory Visit | Attending: Surgery | Admitting: Surgery

## 2023-11-29 DIAGNOSIS — E210 Primary hyperparathyroidism: Secondary | ICD-10-CM | POA: Insufficient documentation

## 2023-11-29 MED ORDER — IOHEXOL 300 MG/ML  SOLN
75.0000 mL | Freq: Once | INTRAMUSCULAR | Status: AC | PRN
  Administered 2023-11-29: 75 mL via INTRAVENOUS

## 2023-11-29 MED ORDER — IOHEXOL 350 MG/ML SOLN: 75.0000 mL | Freq: Once | INTRAVENOUS | Status: DC | PRN

## 2023-12-11 ENCOUNTER — Ambulatory Visit: Payer: Self-pay | Admitting: Surgery

## 2023-12-11 NOTE — Progress Notes (Signed)
 CT scan and sestamibi scan both localize a right parathyroid  adenoma that is very large at 3 cm in size.  Will plan to proceed with minimally invasive parathyroidectomy as an outpatient surgery as we discussed in the office.  I will enter orders for surgery and send them to my schedulers to contact the patient.  Burnard - see orders and forward to schedulers please.  Krystal Spinner, MD [facility] practice Office: [phone]

## 2023-12-27 ENCOUNTER — Ambulatory Visit: Payer: PRIVATE HEALTH INSURANCE | Admitting: Family Medicine

## 2024-01-24 ENCOUNTER — Ambulatory Visit (INDEPENDENT_AMBULATORY_CARE_PROVIDER_SITE_OTHER): Payer: PRIVATE HEALTH INSURANCE

## 2024-01-24 ENCOUNTER — Encounter: Payer: Self-pay | Admitting: Emergency Medicine

## 2024-01-24 ENCOUNTER — Ambulatory Visit
Admission: EM | Admit: 2024-01-24 | Discharge: 2024-01-24 | Disposition: A | Discharge status: Home / Self Care | Payer: PRIVATE HEALTH INSURANCE

## 2024-01-24 ENCOUNTER — Other Ambulatory Visit: Payer: Self-pay | Admitting: Family Medicine

## 2024-01-24 DIAGNOSIS — S8391XA Sprain of unspecified site of right knee, initial encounter: Secondary | ICD-10-CM | POA: Diagnosis not present

## 2024-01-24 DIAGNOSIS — M25561 Pain in right knee: Secondary | ICD-10-CM | POA: Diagnosis not present

## 2024-01-24 DIAGNOSIS — E559 Vitamin D deficiency, unspecified: Secondary | ICD-10-CM

## 2024-01-24 MED ORDER — DICLOFENAC SODIUM 50 MG PO TBEC: 50.0000 mg | DELAYED_RELEASE_TABLET | Freq: Two times a day (BID) | ORAL | 1 refills | Status: AC

## 2024-01-24 MED ORDER — DEXAMETHASONE SOD PHOSPHATE PF 10 MG/ML IJ SOLN
10.0000 mg | Freq: Once | INTRAMUSCULAR | Status: AC
  Administered 2024-01-24: 10 mg via INTRAMUSCULAR

## 2024-01-24 NOTE — ED Provider Notes (Signed)
 [facility]  Note:  This document was prepared using Conservation officer, historic buildings and may include unintentional dictation errors.  MRN: [id number] DOB: 07-15-1975  Subjective:   Stacy Miles is a 48 y.o. female presenting for evaluation of right knee pain with radiation to the lower leg since yesterday.  Patient reports that she is on her feet a lot during her 2 jobs and feels like symptoms somewhat worsen when standing for long periods and when driving.  Denies any known injury or trauma.  Patient reports that she has taken some naproxen  which seemed to help symptoms.  Denies any past history of knee injury.  Patient reports that she did have similar pain to the left knee a few years ago was seen in urgent care and given an injection that seemed to help her symptoms.  Patient denies any current left knee pain.   Current Facility-Administered Medications:    dexamethasone  (DECADRON ) injection 10 mg, 10 mg, Intramuscular, Once, Nataleah Scioneaux B, NP  Current Outpatient Medications:    Cholecalciferol  1.25 MG (50000 UT) capsule, Take 1 capsule (50,000 Units total) by mouth once a week. d3, Disp: 13 capsule, Rfl: 1   diclofenac (VOLTAREN) 50 MG EC tablet, Take 1 tablet (50 mg total) by mouth 2 (two) times daily., Disp: 30 tablet, Rfl: 1   losartan  (COZAAR ) 50 MG tablet, Take 1 tablet (50 mg total) by mouth daily., Disp: 90 tablet, Rfl: 3   norethindrone  (MICRONOR ) 0.35 MG tablet, Take 1 tablet (0.35 mg total) by mouth daily., Disp: 84 tablet, Rfl: 3   azelastine  (ASTELIN ) 0.1 % nasal spray, Place 2 sprays into both nostrils as needed for rhinitis. Use in each nostril as directed, Disp: , Rfl:    fluticasone  (FLONASE ) 50 MCG/ACT nasal spray, Place 2 sprays into both nostrils as needed for allergies or rhinitis., Disp: , Rfl:    ibuprofen  (ADVIL ) 800 MG tablet, Take 1 tablet (800 mg total) by mouth every 8 (eight) hours as needed., Disp: 30 tablet, Rfl: 1   levocetirizine (XYZAL ) 5  MG tablet, Take 1 tablet (5 mg total) by mouth every evening., Disp: 30 tablet, Rfl: 3   tirzepatide  (MOUNJARO ) 5 MG/0.5ML Pen, Inject 5 mg into the skin once a week., Disp: 6 mL, Rfl: 3   tirzepatide  (ZEPBOUND ) 2.5 MG/0.5ML Pen, Inject 2.5 mg into the skin once a week., Disp: 2 mL, Rfl: 0   No Known Allergies  Past Medical History:  Diagnosis Date   Anemia    Depression    Dysmenorrhea    Fibroid    History of blood transfusion 09/2016   [facility] - 2 units transfused   Hypertension    Insomnia    Iron  deficiency anemia    Stress      Past Surgical History:  Procedure Laterality Date   DILATATION & CURETTAGE/HYSTEROSCOPY WITH MYOSURE N/A 11/13/2016   Procedure: DILATATION & CURETTAGE/HYSTEROSCOPY WITH MYOSURE;  Surgeon: Jannis Kate Norris, MD;  Location: [facility];  Service: Gynecology;  Laterality: N/A;   DILATION AND CURETTAGE OF UTERUS     IR EMBO TUMOR ORGAN ISCHEMIA INFARCT INC GUIDE ROADMAPPING  11/09/2022   IR FLUORO GUIDED NEEDLE PLC ASPIRATION/INJECTION LOC  11/09/2022   IR RADIOLOGIST EVAL & MGMT  10/10/2022   IR RADIOLOGIST EVAL & MGMT  11/23/2022   OTHER SURGICAL HISTORY     cervical polyp removal     Family History  Problem Relation Age of Onset   Diabetes Mother    Hypertension Mother  Arthritis Mother    Asthma Mother    COPD Father    Breast cancer Maternal Grandmother        mat great gm    Social History   Tobacco Use   Smoking status: Never   Smokeless tobacco: Never  Vaping Use   Vaping status: Never Used  Substance Use Topics   Alcohol use: No   Drug use: No    ROS Refer to HPI for ROS details.  Objective:   Vitals: BP 117/69 (BP Location: Left Arm)   Pulse 88   Temp 98.3 F (36.8 C) (Oral)   Resp 18   Ht 5' 6 (1.676 m)   Wt 240 lb (108.9 kg)   SpO2 95%   BMI 38.74 kg/m   Physical Exam Vitals and nursing note reviewed.  Constitutional:      General: She is not in acute distress.    Appearance: Normal appearance. She is  well-developed. She is not ill-appearing or toxic-appearing.  HENT:     Head: Normocephalic and atraumatic.  Cardiovascular:     Rate and Rhythm: Normal rate.  Pulmonary:     Effort: Pulmonary effort is normal. No respiratory distress.  Musculoskeletal:     Right knee: No swelling, erythema or bony tenderness. Decreased range of motion. Tenderness present. Normal pulse.  Skin:    General: Skin is warm and dry.  Neurological:     General: No focal deficit present.     Mental Status: She is alert and oriented to person, place, and time.  Psychiatric:        Mood and Affect: Mood normal.        Behavior: Behavior normal.     Procedures  No results found for this or any previous visit (from the past 24 hours).  DG Knee Complete 4 Views Right Result Date: 01/24/2024 CLINICAL DATA:  Right knee pain. EXAM: RIGHT KNEE - COMPLETE 4+ VIEW COMPARISON:  None Available. FINDINGS: There is no acute fracture or dislocation. The bones are osteopenic. No significant arthritic changes. No joint effusion. The soft tissues unremarkable. IMPRESSION: No acute findings. Osteopenia. Electronically Signed   By: Vanetta Chou M.D.   On: 01/24/2024 18:25     Assessment and Plan :     Discharge Instructions       1. Sprain of right knee, unspecified ligament, initial encounter (Primary) - DG Knee Complete 4 Views Right x-ray performed in UC shows no acute fracture or dislocation of the right knee, no effusion, no acute bony injury, no significant swelling to soft tissue. - diclofenac (VOLTAREN) 50 MG EC tablet; Take 1 tablet (50 mg total) by mouth 2 (two) times daily.  Dispense: 30 tablet; Refill: 1 - AMB referral to orthopedics for follow-up evaluation and ongoing management of right knee sprain - dexamethasone  (DECADRON ) injection 10 mg given in UC for acute right knee inflammation and pain. -Continue to monitor symptoms for any change in severity if there is any escalation of current symptoms or  development of new symptoms follow-up in ER for further evaluation and management.       Jahmarion Popoff B Jevaun Strick   Kamare Caspers, [place], [place] 01/24/24 [phone]

## 2024-01-24 NOTE — Discharge Instructions (Addendum)
  1. Sprain of right knee, unspecified ligament, initial encounter (Primary) - DG Knee Complete 4 Views Right x-ray performed in UC shows no acute fracture or dislocation of the right knee, no effusion, no acute bony injury, no significant swelling to soft tissue. - diclofenac (VOLTAREN) 50 MG EC tablet; Take 1 tablet (50 mg total) by mouth 2 (two) times daily.  Dispense: 30 tablet; Refill: 1 - AMB referral to orthopedics for follow-up evaluation and ongoing management of right knee sprain - dexamethasone  (DECADRON ) injection 10 mg given in UC for acute right knee inflammation and pain. -Continue to monitor symptoms for any change in severity if there is any escalation of current symptoms or development of new symptoms follow-up in ER for further evaluation and management.

## 2024-01-24 NOTE — ED Triage Notes (Signed)
 Patient c/o right lower leg pain that radiates from knee down leg since yesterday.  Worse when driving.  No injury.  Denies any swelling.  Patient does stand a lot on both of her jobs.  Patient has taken Naproxen .

## 2024-01-31 ENCOUNTER — Other Ambulatory Visit (HOSPITAL_BASED_OUTPATIENT_CLINIC_OR_DEPARTMENT_OTHER): Payer: Self-pay

## 2024-03-03 ENCOUNTER — Encounter (HOSPITAL_BASED_OUTPATIENT_CLINIC_OR_DEPARTMENT_OTHER): Payer: Self-pay | Admitting: Obstetrics & Gynecology

## 2024-03-06 ENCOUNTER — Other Ambulatory Visit (HOSPITAL_BASED_OUTPATIENT_CLINIC_OR_DEPARTMENT_OTHER): Payer: PRIVATE HEALTH INSURANCE

## 2024-03-06 ENCOUNTER — Other Ambulatory Visit (HOSPITAL_BASED_OUTPATIENT_CLINIC_OR_DEPARTMENT_OTHER): Payer: Self-pay

## 2024-03-06 DIAGNOSIS — R7989 Other specified abnormal findings of blood chemistry: Secondary | ICD-10-CM

## 2024-03-07 LAB — FERRITIN: Ferritin: 241 ng/mL — ABNORMAL HIGH (ref 15–150)

## 2024-04-03 ENCOUNTER — Encounter: Payer: Self-pay | Admitting: Family Medicine

## 2024-04-03 ENCOUNTER — Ambulatory Visit (INDEPENDENT_AMBULATORY_CARE_PROVIDER_SITE_OTHER): Payer: PRIVATE HEALTH INSURANCE | Admitting: Family Medicine

## 2024-04-03 VITALS — BP 122/73 | HR 73 | Temp 98.4°F | Ht 66.0 in | Wt 240.0 lb

## 2024-04-03 DIAGNOSIS — E210 Primary hyperparathyroidism: Secondary | ICD-10-CM | POA: Insufficient documentation

## 2024-04-03 DIAGNOSIS — E559 Vitamin D deficiency, unspecified: Secondary | ICD-10-CM

## 2024-04-03 DIAGNOSIS — Z1231 Encounter for screening mammogram for malignant neoplasm of breast: Secondary | ICD-10-CM

## 2024-04-03 DIAGNOSIS — Z6841 Body Mass Index (BMI) 40.0 and over, adult: Secondary | ICD-10-CM | POA: Diagnosis not present

## 2024-04-03 DIAGNOSIS — R0683 Snoring: Secondary | ICD-10-CM | POA: Diagnosis not present

## 2024-04-03 DIAGNOSIS — R7303 Prediabetes: Secondary | ICD-10-CM

## 2024-04-03 DIAGNOSIS — Z1211 Encounter for screening for malignant neoplasm of colon: Secondary | ICD-10-CM | POA: Diagnosis not present

## 2024-04-03 DIAGNOSIS — R7989 Other specified abnormal findings of blood chemistry: Secondary | ICD-10-CM | POA: Diagnosis not present

## 2024-04-03 DIAGNOSIS — I10 Essential (primary) hypertension: Secondary | ICD-10-CM

## 2024-04-03 MED ORDER — TOPIRAMATE 25 MG PO TABS: 25.0000 mg | ORAL_TABLET | Freq: Two times a day (BID) | ORAL | 2 refills | Status: AC

## 2024-04-03 MED ORDER — WEGOVY 0.25 MG/0.5ML ~~LOC~~ SOAJ: 0.2500 mg | SUBCUTANEOUS | 2 refills | Status: DC

## 2024-04-03 NOTE — Assessment & Plan Note (Signed)
 Chronic  Continue Losartan  50mg  daily

## 2024-04-03 NOTE — Assessment & Plan Note (Signed)
 Chronic  Last vitamin D  Lab Results  Component Value Date   VD25OH 17.90 (L) 07/09/2023  Recommended supplementation

## 2024-04-03 NOTE — Patient Instructions (Addendum)
" ° °  ° ° ° ° ° ° ° ° ° ° ° ° ° ° ° ° ° ° ° ° ° °  Contains text generated by [employer].                                 Contains text generated by [employer]. \   To keep you healthy, please keep in mind the following health maintenance items that you are due for:   Health Maintenance Due  Topic Date Due   Colonoscopy  Never done   Influenza Vaccine  Never done   Mammogram  05/02/2024     Best Wishes,   Dr. Lang  "

## 2024-04-03 NOTE — Assessment & Plan Note (Signed)
 Chronic  Lab Results  Component Value Date   HGBA1C 5.6 07/09/2023   Last check was normal  Continue to recommend low glycemic foods and regular exercise to control glucose

## 2024-04-03 NOTE — Progress Notes (Signed)
 "  Established Patient Office Visit  Patient ID: Stacy Miles, female    DOB: May 15, 1975  Age: 49 y.o. MRN: [id number] PCP: Sharma Coyer, MD  Chief Complaint  Patient presents with   Medical Management of Chronic Issues    Patient is present for chronic care follow up with PCP, doing well today  Pt has appt for mammo, unsire if order is needed     Subjective:     HPI  Discussed the use of AI scribe software for clinical note transcription with the patient, who gave verbal consent to proceed.  History of Present Illness Stacy Miles is a 49 year old female with class two obesity who presents for weight management follow-up.  She has a BMI of 38 and has faced challenges with insurance coverage for obesity management medications, including a denial for Zioptan. She has explored other weight loss options, such as the Rho program, which offers both pill and injectable forms. She previously tried phentermine , which was effective for weight loss but caused headaches and jitteriness, and orlistat, which was not compatible with her work schedule. She has not yet tried metformin or Topamax .  She has primary hyperthyroidism and was recommended for a parathyroidectomy, but insurance issues have delayed the surgery. She also mentioned a past incident where a liver mass was identified, and she was referred to a specialist, but insurance did not cover the visit, resulting in a missed appointment.  She experiences symptoms suggestive of sleep apnea, including snoring, occasional gasping during sleep, and daytime sleepiness. She has not undergone a sleep study.  She uses Micronor  for dysmenorrhea.   Patient Active Problem List   Diagnosis Date Noted   Primary hyperparathyroidism 04/03/2024   Elevated PTHrP level 02/23/2023   Elevated alkaline phosphatase level 02/23/2023   Hypercalcemia 01/05/2023   Colon cancer screening 01/05/2023   Breast cancer screening by mammogram 07/15/2022    Liver lesion 01/24/2022   Prediabetes 01/05/2022   Seborrheic dermatitis 03/11/2020   Laryngitis 03/11/2020   Hypertension 01/21/2020   Annual physical exam 11/07/2018   Allergic rhinitis 11/07/2018   Stress 03/07/2018   Depression 03/07/2018   Leukopenia 07/19/2017   Vitamin D  deficiency 06/24/2017   Morbid obesity with BMI of 40.0-44.9, adult (HCC) 06/07/2017   IDA (iron  deficiency anemia) 10/04/2016   Allergies as of 04/03/2024   No Known Allergies      Medication List        Accurate as of April 03, 2024  5:06 PM. If you have any questions, ask your nurse or doctor.          STOP taking these medications    tirzepatide  5 MG/0.5ML Pen Commonly known as: MOUNJARO  Stopped by: Coyer Sharma, MD   Zepbound  2.5 MG/0.5ML Pen Generic drug: tirzepatide  Stopped by: Coyer Sharma, MD       TAKE these medications    azelastine  0.1 % nasal spray Commonly known as: ASTELIN  Place 2 sprays into both nostrils as needed for rhinitis. Use in each nostril as directed   diclofenac  50 MG EC tablet Commonly known as: VOLTAREN  Take 1 tablet (50 mg total) by mouth 2 (two) times daily.   fluticasone  50 MCG/ACT nasal spray Commonly known as: FLONASE  Place 2 sprays into both nostrils as needed for allergies or rhinitis.   ibuprofen  800 MG tablet Commonly known as: ADVIL  Take 1 tablet (800 mg total) by mouth every 8 (eight) hours as needed.   levocetirizine 5 MG tablet Commonly known as: XYZAL  Take 1 tablet (  5 mg total) by mouth every evening.   losartan  50 MG tablet Commonly known as: COZAAR  Take 1 tablet (50 mg total) by mouth daily.   norethindrone  0.35 MG tablet Commonly known as: MICRONOR  Take 1 tablet (0.35 mg total) by mouth daily.   topiramate  25 MG tablet Commonly known as: Topamax  Take 1 tablet (25 mg total) by mouth 2 (two) times daily. Started by: Rockie Agent, MD   Vitamin D3 1.25 MG (50000 UT) Caps TAKE 1 CAPSULE  (50,000 UNITS TOTAL) BY MOUTH ONCE A WEEK.   Wegovy  0.25 MG/0.5ML Soaj SQ injection Generic drug: semaglutide -weight management Inject 0.25 mg into the skin once a week. Started by: Rockie Agent, MD        ROS    Objective:     BP 122/73 (BP Location: Right Arm, Patient Position: Sitting, Cuff Size: Normal)   Pulse 73   Temp 98.4 F (36.9 C) (Oral)   Ht 5' 6 (1.676 m)   Wt 240 lb (108.9 kg)   SpO2 100%   BMI 38.74 kg/m    Neck: 19.25in11 Stop-bang score 4, high risk OSA  Physical Exam Vitals reviewed.  Constitutional:      General: She is not in acute distress.    Appearance: Normal appearance. She is not ill-appearing.  Cardiovascular:     Rate and Rhythm: Normal rate and regular rhythm.  Pulmonary:     Effort: Pulmonary effort is normal. No respiratory distress.     Breath sounds: No wheezing, rhonchi or rales.  Neurological:     Mental Status: She is alert and oriented to person, place, and time.  Psychiatric:        Mood and Affect: Mood normal.        Behavior: Behavior normal.      No results found for any visits on 04/03/24.  Last CBC Lab Results  Component Value Date   WBC 4.0 08/09/2023   HGB 14.1 08/09/2023   HCT 39.8 08/09/2023   MCV 88.1 08/09/2023   MCH 31.2 08/09/2023   RDW 13.1 08/09/2023   PLT 212 08/09/2023   Last metabolic panel Lab Results  Component Value Date   GLUCOSE 99 07/09/2023   NA 141 07/09/2023   K 4.1 07/09/2023   CL 110 07/09/2023   CO2 24 07/09/2023   BUN 13 07/09/2023   CREATININE 0.79 07/09/2023   GFR 89.56 07/09/2023   CALCIUM 11.5 (H) 07/09/2023   PHOS 3.1 01/24/2023   PROT 7.1 07/09/2023   ALBUMIN 4.3 07/09/2023   LABGLOB 2.9 08/09/2023   AGRATIO 1.6 07/06/2022   BILITOT 0.5 07/09/2023   ALKPHOS 254 (H) 07/09/2023   AST 22 07/09/2023   ALT 23 07/09/2023   ANIONGAP 12 06/25/2014   Last lipids Lab Results  Component Value Date   CHOL 202 (H) 07/09/2023   HDL 71.10 07/09/2023    LDLCALC 114 (H) 07/09/2023   TRIG 86.0 07/09/2023   CHOLHDL 3 07/09/2023   Last hemoglobin A1c Lab Results  Component Value Date   HGBA1C 5.6 07/09/2023   Last thyroid  functions Lab Results  Component Value Date   TSH 0.98 07/09/2023   FREET4 0.77 06/21/2017   Last vitamin D  Lab Results  Component Value Date   VD25OH 17.90 (L) 07/09/2023   Last vitamin B12 and Folate Lab Results  Component Value Date   VITAMINB12 321 08/09/2023   FOLATE 13.1 08/09/2023      The 10-year ASCVD risk score (Arnett DK, et al., 2019) is: 1.4%  Assessment & Plan:   Problem List Items Addressed This Visit     Colon cancer screening   Relevant Orders   Ambulatory referral to Gastroenterology   Elevated PTHrP level (Chronic)   Relevant Orders   Ambulatory referral to Endocrinology   Hypertension - Primary   Chronic  Continue Losartan  50mg  daily       Morbid obesity with BMI of 40.0-44.9, adult (HCC)   Morbid obesity Chronic  Class II obesity with BMI of 38. Previous attempts to initiate injectable medication for obesity management were denied by insurance. Discussed alternative weight management options including Topamax , Wegovy , and potential insurance coverage through a sleep study diagnosis of sleep apnea. Previous adverse effects with phentermine  and orlistat noted. Metformin and Contrave discussed as potential options. Sleep apnea diagnosis could facilitate insurance coverage for Zepbound . - Prescribed Topamax  25 mg twice a day for weight management. - Attempted to obtain insurance approval for Wegovy  0.25 mg once a day. - Ordered sleep study to evaluate for sleep apnea. - Discussed potential use of Zepbound  if sleep apnea is diagnosed. - Advised discontinuation of Topamax  if not effective after one month and notify via MyChart.      Relevant Medications   semaglutide -weight management (WEGOVY ) 0.25 MG/0.5ML SOAJ SQ injection   topiramate  (TOPAMAX ) 25 MG tablet   Other  Relevant Orders   Home sleep test   Prediabetes   Chronic  Lab Results  Component Value Date   HGBA1C 5.6 07/09/2023   Last check was normal  Continue to recommend low glycemic foods and regular exercise to control glucose       Primary hyperparathyroidism   Relevant Orders   Ambulatory referral to Endocrinology   Vitamin D  deficiency   Chronic  Last vitamin D  Lab Results  Component Value Date   VD25OH 17.90 (L) 07/09/2023  Recommended supplementation        Other Visit Diagnoses       Encounter for screening mammogram for malignant neoplasm of breast       Relevant Orders   MM 3D SCREENING MAMMOGRAM BILATERAL BREAST     Snoring       Relevant Orders   Home sleep test       Assessment and Plan Assessment & Plan   Primary hyperparathyroidism Chronic  Diagnosed with primary hyperparathyroidism and recommended for parathyroidectomy. Insurance issues have delayed surgical intervention. Discussed potential referral to another endocrinologist to facilitate the process and explore cost-effective options. - Referred to another endocrinologist to facilitate parathyroidectomy process.  Snoring, Suspected sleep apnea Chronic  Reports snoring and daytime sleepiness. No previous sleep study conducted. Sleep apnea diagnosis could aid in obtaining insurance coverage for weight management medications. Discussed potential cardiovascular risks associated with untreated sleep apnea, including pulmonary hypertension. - Ordered sleep study to evaluate for sleep apnea.  General health maintenance Routine health maintenance screenings discussed. Mammogram and colon cancer screening ordered. - Ordered mammogram for breast cancer screening. - Ordered GI referral for colon cancer screening.    Return in about 3 months (around 07/02/2024) for Weight MGMT.parathyroid  .    Rockie Agent, MD [facility]   "

## 2024-04-03 NOTE — Assessment & Plan Note (Signed)
 Morbid obesity Chronic  Class II obesity with BMI of 38. Previous attempts to initiate injectable medication for obesity management were denied by insurance. Discussed alternative weight management options including Topamax , Wegovy , and potential insurance coverage through a sleep study diagnosis of sleep apnea. Previous adverse effects with phentermine  and orlistat noted. Metformin and Contrave discussed as potential options. Sleep apnea diagnosis could facilitate insurance coverage for Zepbound . - Prescribed Topamax  25 mg twice a day for weight management. - Attempted to obtain insurance approval for Wegovy  0.25 mg once a day. - Ordered sleep study to evaluate for sleep apnea. - Discussed potential use of Zepbound  if sleep apnea is diagnosed. - Advised discontinuation of Topamax  if not effective after one month and notify via MyChart.

## 2024-04-08 ENCOUNTER — Telehealth: Payer: Self-pay

## 2024-04-08 ENCOUNTER — Other Ambulatory Visit (HOSPITAL_COMMUNITY): Payer: Self-pay

## 2024-04-08 NOTE — Telephone Encounter (Signed)
 Pharmacy Patient Advocate Encounter   Received notification from Physician's Office that prior authorization for Wegovy  0.25mg /0.66ml is required/requested.   Insurance verification completed.   The patient is insured through [employer].   Per test claim: Per test claim, medication is not covered due to plan/benefit exclusion, PA not submitted at this time

## 2024-04-11 ENCOUNTER — Encounter: Payer: Self-pay | Admitting: Family Medicine

## 2024-04-11 MED ORDER — NALTREXONE HCL 50 MG PO TABS: 50.0000 mg | ORAL_TABLET | Freq: Every day | ORAL | 1 refills | Status: AC

## 2024-04-11 MED ORDER — BUPROPION HCL ER (XL) 150 MG PO TB24: 150.0000 mg | ORAL_TABLET | Freq: Every day | ORAL | 1 refills | Status: AC

## 2024-04-11 NOTE — Addendum Note (Signed)
 Addended by: SIMMONS-ROBINSON, Takeyah Wieman L on: 04/11/2024 10:55 AM   Modules accepted: Orders

## 2024-07-17 ENCOUNTER — Ambulatory Visit: Payer: PRIVATE HEALTH INSURANCE | Admitting: Family Medicine

## 2024-10-07 ENCOUNTER — Ambulatory Visit (HOSPITAL_BASED_OUTPATIENT_CLINIC_OR_DEPARTMENT_OTHER): Payer: PRIVATE HEALTH INSURANCE | Admitting: Obstetrics & Gynecology
